# Patient Record
Sex: Male | Born: 1955 | Race: Black or African American | Hispanic: No | Marital: Single | Smoking: Current every day smoker
Health system: Southern US, Community
[De-identification: ages and names within clinical notes are randomized; demographics above are authoritative.]

## PROBLEM LIST (undated history)

## (undated) DIAGNOSIS — R319 Hematuria, unspecified: Secondary | ICD-10-CM

## (undated) DIAGNOSIS — N390 Urinary tract infection, site not specified: Secondary | ICD-10-CM

## (undated) DIAGNOSIS — A549 Gonococcal infection, unspecified: Secondary | ICD-10-CM

## (undated) DIAGNOSIS — K219 Gastro-esophageal reflux disease without esophagitis: Secondary | ICD-10-CM

## (undated) DIAGNOSIS — K279 Peptic ulcer, site unspecified, unspecified as acute or chronic, without hemorrhage or perforation: Secondary | ICD-10-CM

---

## 2001-09-12 DIAGNOSIS — K279 Peptic ulcer, site unspecified, unspecified as acute or chronic, without hemorrhage or perforation: Secondary | ICD-10-CM

## 2001-09-12 HISTORY — PX: REPAIR OF PERFORATED ULCER: SHX6065

## 2001-09-12 HISTORY — DX: Peptic ulcer, site unspecified, unspecified as acute or chronic, without hemorrhage or perforation: K27.9

## 2007-09-13 DIAGNOSIS — N390 Urinary tract infection, site not specified: Secondary | ICD-10-CM

## 2007-09-13 DIAGNOSIS — A549 Gonococcal infection, unspecified: Secondary | ICD-10-CM

## 2007-09-13 DIAGNOSIS — R319 Hematuria, unspecified: Secondary | ICD-10-CM

## 2007-09-13 HISTORY — DX: Urinary tract infection, site not specified: N39.0

## 2007-09-13 HISTORY — DX: Gonococcal infection, unspecified: A54.9

## 2007-09-13 HISTORY — DX: Hematuria, unspecified: R31.9

## 2018-10-10 ENCOUNTER — Other Ambulatory Visit: Payer: Self-pay

## 2018-10-10 ENCOUNTER — Emergency Department (HOSPITAL_BASED_OUTPATIENT_CLINIC_OR_DEPARTMENT_OTHER)
Admission: EM | Admit: 2018-10-10 | Discharge: 2018-10-10 | Disposition: A | Discharge status: Home / Self Care | Payer: Self-pay | Attending: Emergency Medicine | Admitting: Emergency Medicine

## 2018-10-10 ENCOUNTER — Encounter (HOSPITAL_BASED_OUTPATIENT_CLINIC_OR_DEPARTMENT_OTHER): Payer: Self-pay

## 2018-10-10 DIAGNOSIS — F17210 Nicotine dependence, cigarettes, uncomplicated: Secondary | ICD-10-CM | POA: Insufficient documentation

## 2018-10-10 DIAGNOSIS — K279 Peptic ulcer, site unspecified, unspecified as acute or chronic, without hemorrhage or perforation: Secondary | ICD-10-CM | POA: Insufficient documentation

## 2018-10-10 HISTORY — DX: Gastro-esophageal reflux disease without esophagitis: K21.9

## 2018-10-10 LAB — CBC
HCT: 39.3 % (ref 39.0–52.0)
Hemoglobin: 13.4 g/dL (ref 13.0–17.0)
MCH: 34.5 pg — ABNORMAL HIGH (ref 26.0–34.0)
MCHC: 34.1 g/dL (ref 30.0–36.0)
MCV: 101.3 fL — ABNORMAL HIGH (ref 80.0–100.0)
Platelets: 251 10*3/uL (ref 150–400)
RBC: 3.88 MIL/uL — ABNORMAL LOW (ref 4.22–5.81)
RDW: 12.7 % (ref 11.5–15.5)
WBC: 11.6 10*3/uL — ABNORMAL HIGH (ref 4.0–10.5)
nRBC: 0 % (ref 0.0–0.2)

## 2018-10-10 LAB — COMPREHENSIVE METABOLIC PANEL
ALT: 20 U/L (ref 0–44)
AST: 33 U/L (ref 15–41)
Albumin: 4.2 g/dL (ref 3.5–5.0)
Alkaline Phosphatase: 86 U/L (ref 38–126)
Anion gap: 10 (ref 5–15)
BUN: 17 mg/dL (ref 8–23)
CO2: 26 mmol/L (ref 22–32)
Calcium: 9.1 mg/dL (ref 8.9–10.3)
Chloride: 102 mmol/L (ref 98–111)
Creatinine, Ser: 1.09 mg/dL (ref 0.61–1.24)
GFR calc Af Amer: >60 mL/min (ref >60)
GFR calc non Af Amer: >60 mL/min (ref >60)
Glucose, Bld: 166 mg/dL — ABNORMAL HIGH (ref 70–99)
Potassium: 3.7 mmol/L (ref 3.5–5.1)
Sodium: 138 mmol/L (ref 135–145)
Total Bilirubin: 0.5 mg/dL (ref 0.3–1.2)
Total Protein: 7.6 g/dL (ref 6.5–8.1)

## 2018-10-10 LAB — TROPONIN I: Troponin I: <0.03 ng/mL (ref <0.03)

## 2018-10-10 LAB — LIPASE, BLOOD: Lipase: 33 U/L (ref 11–51)

## 2018-10-10 MED ORDER — OMEPRAZOLE 20 MG PO CPDR: 20.0000 mg | DELAYED_RELEASE_CAPSULE | Freq: Every day | ORAL | 1 refills | Status: DC

## 2018-10-10 MED ORDER — ALUM & MAG HYDROXIDE-SIMETH 200-200-20 MG/5ML PO SUSP
30.0000 mL | Freq: Once | ORAL | Status: AC
  Administered 2018-10-10: 30 mL via ORAL
  Filled 2018-10-10: qty 30

## 2018-10-10 MED ORDER — GI COCKTAIL ~~LOC~~: 30.0000 mL | Freq: Two times a day (BID) | ORAL | 1 refills | Status: DC

## 2018-10-10 MED ORDER — LIDOCAINE VISCOUS HCL 2 % MT SOLN
15.0000 mL | Freq: Once | OROMUCOSAL | Status: AC
  Administered 2018-10-10: 15 mL via ORAL
  Filled 2018-10-10: qty 15

## 2018-10-10 NOTE — ED Triage Notes (Signed)
C/o abd pain x 2 week-states feels same as when dx with GERD- and "had a procedure"-states he did take prevacid in the past-none recently-to triage in w/c

## 2018-10-10 NOTE — ED Notes (Signed)
Patient attempted to give a urine sample. Patient unable to at this time.

## 2018-10-10 NOTE — ED Notes (Signed)
Pt c/o upper abdominal pain with 2 episodes of vomiting today. Pt states that he has not been able to take his prevacid as it has been taken off the shelves.

## 2018-10-10 NOTE — Discharge Instructions (Signed)
Avoid any type of ibuprofen, Aleve or aspirin products.  Avoid alcohol and foods with pineapple, oranges and tomatoes.  Also avoid spicy foods.  If the pain gets worse or you start vomiting and you see blood in it you need to return immediately

## 2018-10-10 NOTE — ED Notes (Signed)
Pt verbalizes understanding of d/c instructions and denies any further need at this time. 

## 2018-10-10 NOTE — ED Provider Notes (Signed)
[facility] EMERGENCY DEPARTMENT Provider Note   CSN: [id number] Arrival date & time: 10/10/18  1651     History   Chief Complaint Chief Complaint  Patient presents with  . Abdominal Pain    HPI Vincent Park is a 63 y.o. male.  The history is provided by the patient and a relative.  Abdominal Pain  Pain location:  Epigastric Pain quality: burning, cramping and gnawing   Pain radiates to:  Does not radiate Pain severity:  Severe Onset quality:  Gradual Duration:  4 weeks Timing:  Intermittent Progression:  Worsening Chronicity:  Recurrent Context: alcohol use, eating and previous surgery   Context: not recent travel, not retching, not sick contacts, not suspicious food intake and not trauma   Context comment:  States he does drink a beer every day but does not drink alcohol heavily.  Prior history of a ruptured gastric ulcer status post surgery.  Last time he took Prevacid was approximately 2 years ago. Relieved by:  None tried Worsened by:  Eating Ineffective treatments:  Belching Associated symptoms: anorexia, flatus and nausea   Associated symptoms: no chest pain, no constipation, no cough, no diarrhea, no fever, no hematemesis, no shortness of breath and no vomiting   Risk factors: no aspirin use, no NSAID use and no recent hospitalization   Risk factors comment:  Daily smoker and drinks of beer daily   Past Medical History:  Diagnosis Date  . GERD (gastroesophageal reflux disease)     There are no active problems to display for this patient.   History reviewed. No pertinent surgical history.      Home Medications    Prior to Admission medications   Not on File    Family History No family history on file.  Social History Social History   Tobacco Use  . Smoking status: Current Every Day Smoker    Types: Cigars, Cigarettes  . Smokeless tobacco: Never Used  Substance Use Topics  . Alcohol use: Yes    Comment: daily  . Drug use: Yes     Types: Marijuana     Allergies   Patient has no known allergies.   Review of Systems Review of Systems  Constitutional: Negative for fever.  Respiratory: Negative for cough and shortness of breath.   Cardiovascular: Negative for chest pain.  Gastrointestinal: Positive for abdominal pain, anorexia, flatus and nausea. Negative for constipation, diarrhea, hematemesis and vomiting.  All other systems reviewed and are negative.    Physical Exam Updated Vital Signs BP 139/79 (BP Location: Left Arm)   Pulse 67   Temp 98.1 F (36.7 C) (Oral)   Resp 18   Ht 6\' 2"  (1.88 m)   Wt 64.1 kg   SpO2 100%   BMI 18.14 kg/m   Physical Exam Vitals signs and nursing note reviewed.  Constitutional:      General: He is not in acute distress.    Appearance: He is well-developed.  HENT:     Head: Normocephalic and atraumatic.     Comments: Edentulous Eyes:     Conjunctiva/sclera: Conjunctivae normal.     Pupils: Pupils are equal, round, and reactive to light.  Neck:     Musculoskeletal: Normal range of motion and neck supple.  Cardiovascular:     Rate and Rhythm: Normal rate and regular rhythm.     Heart sounds: No murmur.  Pulmonary:     Effort: Pulmonary effort is normal. No respiratory distress.     Breath sounds: Normal  breath sounds. No wheezing or rales.  Abdominal:     General: There is no distension.     Palpations: Abdomen is soft.     Tenderness: There is abdominal tenderness in the epigastric area. There is no guarding or rebound.     Hernia: No hernia is present.     Comments: Well-healed upper midline surgical scar  Musculoskeletal: Normal range of motion.        General: No tenderness.  Skin:    General: Skin is warm and dry.     Findings: No erythema or rash.  Neurological:     Mental Status: He is alert and oriented to person, place, and time.  Psychiatric:        Behavior: Behavior normal.      ED Treatments / Results  Labs (all labs ordered are  listed, but only abnormal results are displayed) Labs Reviewed  COMPREHENSIVE METABOLIC PANEL - Abnormal; Notable for the following components:      Result Value   Glucose, Bld 166 (*)    All other components within normal limits  CBC - Abnormal; Notable for the following components:   WBC 11.6 (*)    RBC 3.88 (*)    MCV 101.3 (*)    MCH 34.5 (*)    All other components within normal limits  LIPASE, BLOOD  TROPONIN I  URINALYSIS, ROUTINE W REFLEX MICROSCOPIC    EKG EKG Interpretation  Date/Time:  Wednesday October 10 2018 17:14:50 EST Ventricular Rate:  65 PR Interval:    QRS Duration: 84 QT Interval:  444 QTC Calculation: 462 R Axis:   71 Text Interpretation:  Sinus rhythm Biatrial enlargement ST elevation, consider inferior injury No previous tracing Confirmed by Gwyneth SproutPlunkett, Ladawn Boullion ([id number]) on 10/10/2018 5:21:40 PM   Radiology No results found.  Procedures Procedures (including critical care time)  Medications Ordered in ED Medications  alum & mag hydroxide-simeth (MAALOX/MYLANTA) 200-200-20 MG/5ML suspension 30 mL (has no administration in time range)    And  lidocaine (XYLOCAINE) 2 % viscous mouth solution 15 mL (has no administration in time range)     Initial Impression / Assessment and Plan / ED Course  I have reviewed the triage vital signs and the nursing notes.  Pertinent labs & imaging results that were available during my care of the patient were reviewed by me and considered in my medical decision making (see chart for details).     Patient presenting today with 3 to 4 weeks of worsening epigastric discomfort that is significantly worsened by eating or drinking anything.  He describes as a burning pain that feels like it is on fire in his epigastric region.  Prior history of ruptured gastric ulcer requiring surgery but also intermittently gets reflux.  He has not been on Prevacid for at least 2 years and since symptoms started within the last month he has  not taken anything for it.  However because it hurts for him to eat he is just stopped eating and has lost approximately 12 pounds this month.  He denies any hematemesis or black stool.  On exam patient is well-appearing with normal vital signs.  He has epigastric tenderness but no abdominal findings concerning for appendicitis, diverticulitis, perforated viscus or obstruction.  CBC with mild leukocytosis of 11, CMP and lipase pending.  Patient's EKG with some mild ST elevation but most likely early re-pole.  He denies any chest pain or shortness of breath concerning for ACS.  Patient was given a GI cocktail  6:38 PM Patient's labs are reassuring as well as normal LFTs and lipase.  Troponin within normal limits.  Patient symptoms did improve after a GI cocktail.  Patient given prescription for GI cocktail and omeprazole.  Encouraged him to follow-up with a PCP but also given follow-up for GI. Final Clinical Impressions(s) / ED Diagnoses   Final diagnoses:  PUD (peptic ulcer disease)    ED Discharge Orders         Ordered    Alum & Mag Hydroxide-Simeth (GI COCKTAIL) SUSP suspension  2 times daily     10/10/18 1836    omeprazole (PRILOSEC) 20 MG capsule  Daily     10/10/18 1836           Gwyneth Sprout, MD 10/10/18 [facility]

## 2018-10-25 ENCOUNTER — Inpatient Hospital Stay (HOSPITAL_BASED_OUTPATIENT_CLINIC_OR_DEPARTMENT_OTHER)
Admission: EM | Admit: 2018-10-25 | Discharge: 2018-11-26 | DRG: 393 | Disposition: A | Discharge status: Home / Self Care | Payer: Self-pay | Attending: Internal Medicine | Admitting: Internal Medicine

## 2018-10-25 ENCOUNTER — Encounter (HOSPITAL_BASED_OUTPATIENT_CLINIC_OR_DEPARTMENT_OTHER): Payer: Self-pay | Admitting: *Deleted

## 2018-10-25 ENCOUNTER — Other Ambulatory Visit: Payer: Self-pay

## 2018-10-25 ENCOUNTER — Emergency Department (HOSPITAL_BASED_OUTPATIENT_CLINIC_OR_DEPARTMENT_OTHER): Payer: Self-pay

## 2018-10-25 DIAGNOSIS — K56609 Unspecified intestinal obstruction, unspecified as to partial versus complete obstruction: Secondary | ICD-10-CM | POA: Diagnosis present

## 2018-10-25 DIAGNOSIS — Z9114 Patient's other noncompliance with medication regimen: Secondary | ICD-10-CM

## 2018-10-25 DIAGNOSIS — Q433 Congenital malformations of intestinal fixation: Secondary | ICD-10-CM | POA: Insufficient documentation

## 2018-10-25 DIAGNOSIS — E860 Dehydration: Secondary | ICD-10-CM | POA: Diagnosis present

## 2018-10-25 DIAGNOSIS — R64 Cachexia: Secondary | ICD-10-CM | POA: Diagnosis present

## 2018-10-25 DIAGNOSIS — F17290 Nicotine dependence, other tobacco product, uncomplicated: Secondary | ICD-10-CM | POA: Diagnosis present

## 2018-10-25 DIAGNOSIS — K279 Peptic ulcer, site unspecified, unspecified as acute or chronic, without hemorrhage or perforation: Secondary | ICD-10-CM | POA: Diagnosis present

## 2018-10-25 DIAGNOSIS — E43 Unspecified severe protein-calorie malnutrition: Secondary | ICD-10-CM | POA: Diagnosis present

## 2018-10-25 DIAGNOSIS — L0291 Cutaneous abscess, unspecified: Secondary | ICD-10-CM

## 2018-10-25 DIAGNOSIS — K668 Other specified disorders of peritoneum: Secondary | ICD-10-CM | POA: Diagnosis present

## 2018-10-25 DIAGNOSIS — Z789 Other specified health status: Secondary | ICD-10-CM | POA: Diagnosis present

## 2018-10-25 DIAGNOSIS — Z8711 Personal history of peptic ulcer disease: Secondary | ICD-10-CM

## 2018-10-25 DIAGNOSIS — K219 Gastro-esophageal reflux disease without esophagitis: Secondary | ICD-10-CM | POA: Diagnosis present

## 2018-10-25 DIAGNOSIS — Z681 Body mass index (BMI) 19 or less, adult: Secondary | ICD-10-CM

## 2018-10-25 DIAGNOSIS — F1010 Alcohol abuse, uncomplicated: Secondary | ICD-10-CM | POA: Diagnosis present

## 2018-10-25 DIAGNOSIS — R188 Other ascites: Secondary | ICD-10-CM | POA: Diagnosis present

## 2018-10-25 DIAGNOSIS — K255 Chronic or unspecified gastric ulcer with perforation: Secondary | ICD-10-CM | POA: Diagnosis present

## 2018-10-25 DIAGNOSIS — R0609 Other forms of dyspnea: Secondary | ICD-10-CM

## 2018-10-25 DIAGNOSIS — K449 Diaphragmatic hernia without obstruction or gangrene: Secondary | ICD-10-CM | POA: Diagnosis present

## 2018-10-25 DIAGNOSIS — D638 Anemia in other chronic diseases classified elsewhere: Secondary | ICD-10-CM | POA: Diagnosis present

## 2018-10-25 DIAGNOSIS — K651 Peritoneal abscess: Secondary | ICD-10-CM | POA: Diagnosis present

## 2018-10-25 DIAGNOSIS — Z79899 Other long term (current) drug therapy: Secondary | ICD-10-CM

## 2018-10-25 DIAGNOSIS — Z4659 Encounter for fitting and adjustment of other gastrointestinal appliance and device: Secondary | ICD-10-CM

## 2018-10-25 DIAGNOSIS — K631 Perforation of intestine (nontraumatic): Principal | ICD-10-CM | POA: Diagnosis present

## 2018-10-25 DIAGNOSIS — N179 Acute kidney failure, unspecified: Secondary | ICD-10-CM | POA: Diagnosis present

## 2018-10-25 DIAGNOSIS — K2950 Unspecified chronic gastritis without bleeding: Secondary | ICD-10-CM | POA: Diagnosis present

## 2018-10-25 DIAGNOSIS — F17210 Nicotine dependence, cigarettes, uncomplicated: Secondary | ICD-10-CM | POA: Diagnosis present

## 2018-10-25 HISTORY — DX: Peptic ulcer, site unspecified, unspecified as acute or chronic, without hemorrhage or perforation: K27.9

## 2018-10-25 HISTORY — DX: Urinary tract infection, site not specified: N39.0

## 2018-10-25 HISTORY — DX: Gonococcal infection, unspecified: A54.9

## 2018-10-25 HISTORY — DX: Hematuria, unspecified: R31.9

## 2018-10-25 LAB — CBC WITH DIFFERENTIAL/PLATELET
ABS IMMATURE GRANULOCYTES: 0.1 10*3/uL — AB (ref 0.00–0.07)
Basophils Absolute: 0 10*3/uL (ref 0.0–0.1)
Basophils Relative: 0 %
Eosinophils Absolute: 0 10*3/uL (ref 0.0–0.5)
Eosinophils Relative: 0 %
HCT: 38 % — ABNORMAL LOW (ref 39.0–52.0)
Hemoglobin: 12.5 g/dL — ABNORMAL LOW (ref 13.0–17.0)
IMMATURE GRANULOCYTES: 1 %
LYMPHS PCT: 7 %
Lymphs Abs: 0.7 10*3/uL (ref 0.7–4.0)
MCH: 33.4 pg (ref 26.0–34.0)
MCHC: 32.9 g/dL (ref 30.0–36.0)
MCV: 101.6 fL — ABNORMAL HIGH (ref 80.0–100.0)
MONO ABS: 0.5 10*3/uL (ref 0.1–1.0)
Monocytes Relative: 5 %
Neutro Abs: 9.1 10*3/uL — ABNORMAL HIGH (ref 1.7–7.7)
Neutrophils Relative %: 87 %
Platelets: 276 10*3/uL (ref 150–400)
RBC: 3.74 MIL/uL — ABNORMAL LOW (ref 4.22–5.81)
RDW: 13.9 % (ref 11.5–15.5)
WBC: 10.5 10*3/uL (ref 4.0–10.5)
nRBC: 0.2 % (ref 0.0–0.2)

## 2018-10-25 LAB — LIPASE, BLOOD: Lipase: 30 U/L (ref 11–51)

## 2018-10-25 LAB — URINALYSIS, ROUTINE W REFLEX MICROSCOPIC
Bilirubin Urine: NEGATIVE
GLUCOSE, UA: NEGATIVE mg/dL
Hgb urine dipstick: NEGATIVE
Ketones, ur: NEGATIVE mg/dL
Leukocytes,Ua: NEGATIVE
Nitrite: NEGATIVE
PH: 5.5 (ref 5.0–8.0)
Protein, ur: NEGATIVE mg/dL
Specific Gravity, Urine: 1.02 (ref 1.005–1.030)

## 2018-10-25 LAB — COMPREHENSIVE METABOLIC PANEL
ALT: 34 U/L (ref 0–44)
AST: 45 U/L — ABNORMAL HIGH (ref 15–41)
Albumin: 2.2 g/dL — ABNORMAL LOW (ref 3.5–5.0)
Alkaline Phosphatase: 134 U/L — ABNORMAL HIGH (ref 38–126)
Anion gap: 13 (ref 5–15)
BUN: 59 mg/dL — ABNORMAL HIGH (ref 8–23)
CO2: 23 mmol/L (ref 22–32)
Calcium: 8.5 mg/dL — ABNORMAL LOW (ref 8.9–10.3)
Chloride: 98 mmol/L (ref 98–111)
Creatinine, Ser: 1.3 mg/dL — ABNORMAL HIGH (ref 0.61–1.24)
GFR calc Af Amer: >60 mL/min (ref >60)
GFR calc non Af Amer: 58 mL/min — ABNORMAL LOW (ref >60)
GLUCOSE: 116 mg/dL — AB (ref 70–99)
Potassium: 5.1 mmol/L (ref 3.5–5.1)
Sodium: 134 mmol/L — ABNORMAL LOW (ref 135–145)
Total Bilirubin: 0.8 mg/dL (ref 0.3–1.2)
Total Protein: 7 g/dL (ref 6.5–8.1)

## 2018-10-25 MED ORDER — PANTOPRAZOLE SODIUM 40 MG IV SOLR: 40.0000 mg | Freq: Two times a day (BID) | INTRAVENOUS | Status: DC

## 2018-10-25 MED ORDER — PANTOPRAZOLE SODIUM 40 MG IV SOLR
INTRAVENOUS | Status: AC
  Filled 2018-10-25: qty 80

## 2018-10-25 MED ORDER — SODIUM CHLORIDE 0.9 % IV SOLN
8.0000 mg/h | INTRAVENOUS | Status: AC
  Administered 2018-10-25 – 2018-10-28 (×6): 8 mg/h via INTRAVENOUS
  Filled 2018-10-25 (×12): qty 80

## 2018-10-25 MED ORDER — SODIUM CHLORIDE 0.9 % IV SOLN
80.0000 mg | Freq: Once | INTRAVENOUS | Status: DC
  Filled 2018-10-25: qty 80

## 2018-10-25 MED ORDER — FAMOTIDINE IN NACL 20-0.9 MG/50ML-% IV SOLN
20.0000 mg | Freq: Once | INTRAVENOUS | Status: AC
  Administered 2018-10-25: 20 mg via INTRAVENOUS
  Filled 2018-10-25: qty 50

## 2018-10-25 MED ORDER — IOPAMIDOL (ISOVUE-300) INJECTION 61%
100.0000 mL | Freq: Once | INTRAVENOUS | Status: AC | PRN
  Administered 2018-10-25: 100 mL via INTRAVENOUS

## 2018-10-25 MED ORDER — SODIUM CHLORIDE 0.9 % IV BOLUS
1000.0000 mL | Freq: Once | INTRAVENOUS | Status: AC
  Administered 2018-10-25: 1000 mL via INTRAVENOUS

## 2018-10-25 MED ORDER — PIPERACILLIN-TAZOBACTAM 3.375 G IVPB 30 MIN
3.3750 g | Freq: Once | INTRAVENOUS | Status: AC
  Administered 2018-10-25: 3.375 g via INTRAVENOUS
  Filled 2018-10-25 (×2): qty 50

## 2018-10-25 NOTE — ED Notes (Signed)
Pt transported to [facility] via [facility] 

## 2018-10-25 NOTE — ED Notes (Signed)
Vincent Park, Pt's son: [phone]

## 2018-10-25 NOTE — ED Notes (Signed)
[facility] notified (Tammy) - patient ready for transport 

## 2018-10-25 NOTE — ED Triage Notes (Signed)
Pt c/o abd  ' burning " x 2 days

## 2018-10-25 NOTE — Consult Note (Signed)
Vincent Park  12-Jan-1956 [id number]  CARE TEAM:  PCP: Patient, No Pcp Per  Outpatient Care Team: Patient Care Team: Patient, No Pcp Per as PCP - General (General Practice)  Inpatient Treatment Team: Treatment Team: Attending Provider: Maia Plan, MD; Physician Assistant: Beryle Quant; Registered Nurse: Elige Ko, RN; Registered Nurse: Lou Miner, RN; Consulting Physician: Montez Morita, Md, MD   This patient is a 63 y.o.male who presents today for surgical evaluation at the request of Jory Sims, PA.  [facility] ED   Chief complaint / Reason for evaluation: Free air and bowel obstruction in patient with prior ulcer surgery.  63 year old gentleman.  Originally from [place].  Relocated to [place] a while ago.  While he was there, he recalls having some type of surgery in 2003.  Sounds like ulcer surgery.   He cannot remember where exactly.  Apparently he has been in [place] since 2007.  I do not think he sees  doctors.  Nothing in Care Everywhere except for +UTI & gonorrhea with a 2009 CAT scan noting no kidney stones & otherwise normal in [facility] in [place] .  He notes he usually has severe heartburn and normally has been on a proton pump inhibitor.  He been on Prevacid for years.  Pepcid in 2009.  However he cannot find it over-the-counter so was trying to do Tums only.  After few years he had a more severe burning postprandial pain.   Came the emergency department 2 weeks ago.  Labs and exam not severely concerning and seemed to have some symptomatic relief with a GI cocktail.  Prescription for PPI made and strong recommendation to follow-up (?establish?) with a primary care physician and gastroenterology.  Apparently that did not happen.  He comes in 2 weeks later tonight with even more weight loss and worsening pain.  Again the [facility] emergency room.  Mildly elevated creatinine but white count not specifically increased.  CT scan done for persistent  worsening symptoms.  Shows dilated & thickened small bowel with transition point suspicious for bowel obstruction.  Possible intraluminal foreign body such as a pill down the pelvis.  A lot of ascites.  Thickened bowel loops.  Free air.  Patient apparently does not have severe peritonitis but obviously concerning.  Surgical consultation requested.  Transfer recommended to the hospital with an operating room.  Recommendation made for internal medicine evaluation for preoperative clearance and care.  Apparently patient admitted directly to floor bed to medicine   Assessment  Vincent Park  63 y.o. male       Problem List:  Principal Problem:   Pneumoperitoneum Active Problems:   History of medication noncompliance   PUD (peptic ulcer disease)   AKI (acute kidney injury) (HCC)   Cachexia (HCC)   Malrotation of intestine   SBO (small bowel obstruction) (HCC)   Ascites   Moderate alcohol consumption   GERD (gastroesophageal reflux disease)   Free air ascites and thickened bowel loops of uncertain etiology.  Perhaps bowel perforation from chronic severe bowel obstruction versus recurrent ulcer disease.  Mild distended and mildly uncomfortable, he is not toxic nor sickly.  He does not have peritonitis.  Confusing picture  Plan:  Admit.  Medical admission & preop clearance  IV fluids.  N.p.o.  Nasogastric tube if has recurrent or persistent nausea, certainly if he has emesis again  Most likely will need operative exploration this hospital admission..  Given the horrific appearance of the  CAT scan, I was concerned he would need immediate laparotomy.  However he does not have peritonitis nor is he toxic right now.  I do not think this needs to happen in the middle the night.  He is severely malnourished.  I think we have a little time to try and sort him out.  However, I do not think that his severe bowel obstruction will resolve without surgery.  I am skeptical that just percutaneous  drainage to start will be enough although it might help temporize things.  I doubt he is a minimally invasive/laparoscopic candidate as the likelihood of severe peritonitis, bowel resection/ostomy and open abdomen very likely.  Clinically he is not toxic or sickly.  He is open to the idea of surgery but is not interested in proceeding right now.  He is not in shock at this moment.  We will delay surgery a few more hours until the morning and allow time for medicine differently evaluate and provide a better team to plan surgical intervention.  I suspect he has been living this chronically obstructed for the past several weeks  The anatomy & physiology of the digestive tract was discussed.  The pathophysiology of perforation was discussed.  Differential diagnosis such as perforated ulcer or colon, etc was discussed.   Natural history risks without surgery such as death was discussed.  I recommended abdominal exploration to diagnose & treat the source of the problem.  Laparoscopic & open techniques were discussed.   Risks such as bleeding, infection, abscess, leak, reoperation, bowel resection, possible ostomy, injury to other organs, need for repair of tissues / organs, hernia, heart attack, death, and other risks were discussed.   The risks of no intervention will lead to serious problems including death.   I expressed a good likelihood that surgery will address the problem.    Goals of post-operative recovery were discussed as well.  We will work to minimize complications although risks in an emergent setting are high.   Questions were answered.  The patient expressed understanding & wishes to proceed with surgery.      -Proton pump inhibitor.  I am skeptical that this is due to recurrent perforated ulcer but that could be an issue -Chest x-ray operative clearance -EKG to rule out dysrhythmia other surprises. -Check prealbumin.  Suspect severe malnutrition with his cachexia and unintentional weight  loss -Lactic acid baseline -VTE prophylaxis- SCDs, etc -mobilize as tolerated to help recovery  90 minutes spent in review, evaluation, examination, counseling, and coordination of care.  More than 50% of that time was spent in counseling.  Ardeth Sportsman, MD, FACS, MASCRS Gastrointestinal and Minimally Invasive Surgery    [address], [address] [place], [place] [address] [phone] Main / Paging [phone] Fax   10/25/2018      Past Medical History:  Diagnosis Date  . GERD (gastroesophageal reflux disease)   . Gonorrhea 2009  . Hematuria 2009  . Peptic ulcer 2003   ?ulcer surgery in [place]?  Marland Kitchen UTI (urinary tract infection) 2009    Past Surgical History:  Procedure Laterality Date  . REPAIR OF PERFORATED ULCER  [place], [place] ?    Social History   Socioeconomic History  . Marital status: Single    Spouse name: Not on file  . Number of children: Not on file  . Years of education: Not on file  . Highest education level: Not on file  Occupational History  . Not on file  Social Needs  . Financial resource strain: Hard  . Food insecurity:    Worry: Not on file    Inability: Not on file  . Transportation needs:    Medical: Yes    Non-medical: Not on file  Tobacco Use  . Smoking status: Current Every Day Smoker    Packs/day: 0.50    Types: Cigars, Cigarettes  . Smokeless tobacco: Never Used  Substance and Sexual Activity  . Alcohol use: Yes    Comment: 3-6 cans/week  . Drug use: Yes    Types: Marijuana  . Sexual activity: Not on file  Lifestyle  . Physical activity:    Days per week: Not on file    Minutes per session: Not on file  . Stress: Not on file  Relationships  . Social connections:    Talks on phone: Not on file    Gets together: Not on file    Attends religious service: Not on file    Active member of club or organization: Not on file    Attends meetings of clubs or organizations: Not on file    Relationship status:  Not on file  . Intimate partner violence:    Fear of current or ex partner: Not on file    Emotionally abused: Not on file    Physically abused: Not on file    Forced sexual activity: Not on file  Other Topics Concern  . Not on file  Social History Narrative   Originally from [place]   Lived in [place] ~ 2003   Lived in [place] since 2007    History reviewed. No pertinent family history.  Current Facility-Administered Medications  Medication Dose Route Frequency Provider Last Rate Last Dose  . piperacillin-tazobactam (ZOSYN) IVPB 3.375 g  3.375 g Intravenous Once Bethel Born, PA-C 100 mL/hr at 10/25/18 1951 3.375 g at 10/25/18 1951   Current Outpatient Medications  Medication Sig Dispense Refill  . Alum & Mag Hydroxide-Simeth (GI COCKTAIL) SUSP suspension Take 30 mLs by mouth 2 (two) times daily. Shake well. 120 mL 1  . omeprazole (PRILOSEC) 20 MG capsule Take 1 capsule (20 mg total) by mouth daily. 30 capsule 1     No Known Allergies  ROS:   All other systems reviewed & are negative except per HPI or as noted below: Constitutional:  No fevers, chills, sweats.  Unintentional weight loss.  He thinks it has been 20 pounds in a month.  He is gone from 64 kg to around 50 kg this admission in 2 weeks Eyes:  No vision changes, No discharge HENT:  No sore throats, nasal drainage Lymph: No neck swelling, No bruising easily Pulmonary:  No cough, productive sputum CV: No orthopnea, PND  Patient walks 10 minutes without difficulty.  No exertional chest/neck/shoulder/arm pain.  Shortness of breath with exertion though. GI:  No personal nor family history of GI/colon cancer, inflammatory bowel disease, irritable bowel syndrome, allergy such as Celiac Sprue, dietary/dairy problems, colitis.  No recent sick contacts/gastroenteritis.  No travel outside the country.  No changes in diet. Renal: No UTIs, No hematuria since 2013 Genital:  No drainage, bleeding,  masses Musculoskeletal: No severe joint pain.  Good ROM major joints Skin:  No sores or lesions.  No rashes Heme/Lymph:  No easy bleeding.  No swollen lymph nodes Neuro: No focal weakness/numbness.  No seizures Psych: No suicidal ideation.  No hallucinations  BP 128/67   Pulse 89   Temp 99 F (37.2 C) (  Rectal)   Resp 18   Ht 6\' 2"  (1.88 m)   Wt 48 kg   SpO2 100%   BMI 13.59 kg/m   Physical Exam: General: Pt awake/alert/oriented x4 in no major acute distress.  Sitting calmly.  Not toxic.  Very cachectic.  Not sickly.  Alert. Eyes: PERRL, normal EOM. Sclera nonicteric Neuro: CN II-XII intact w/o focal sensory/motor deficits. Lymph: No head/neck/groin lymphadenopathy Psych:  No delerium/psychosis/paranoia.  Memory recall rather poor.  Insight fair.   HENT: Normocephalic, Mucus membranes moist.  No thrush.  Tends to mumble.  Hard to understand at times. Neck: Supple, No tracheal deviation Chest: No pain.  Good respiratory excursion. CV:  Pulses intact.  Regular rhythm  Abdomen: Somewhat firm.  Moderately distended.  Nontender.  No pain with bed shake, percussion, cough.  No evidence of peritonitis.  No guarding.  Upper midline incision with epigastric scar consistent with nearly exposed tail of old stitch.  No hernia.    Gen: Uncircumcised.  Normal external genitalia no inguinal hernias.  No inguinal lymphadenopathy.   Ext:  SCDs BLE.  No significant edema.  No cyanosis Skin: No petechiae / purpurea.  No major sores Musculoskeletal: No severe joint pain.  Good ROM major joints   Results:   Labs: Results for orders placed or performed during the hospital encounter of 10/25/18 (from the past 48 hour(s))  CBC with Differential     Status: Abnormal   Collection Time: 10/25/18  5:21 PM  Result Value Ref Range   WBC 10.5 4.0 - 10.5 K/uL    Comment: REPEATED TO VERIFY   RBC 3.74 (L) 4.22 - 5.81 MIL/uL   Hemoglobin 12.5 (L) 13.0 - 17.0 g/dL   HCT 16.138.0 (L) 09.639.0 - 04.552.0 %   MCV  101.6 (H) 80.0 - 100.0 fL   MCH 33.4 26.0 - 34.0 pg   MCHC 32.9 30.0 - 36.0 g/dL   RDW 40.913.9 81.111.5 - 91.415.5 %   Platelets 276 150 - 400 K/uL   nRBC 0.2 0.0 - 0.2 %   Neutrophils Relative % 87 %   Neutro Abs 9.1 (H) 1.7 - 7.7 K/uL   Lymphocytes Relative 7 %   Lymphs Abs 0.7 0.7 - 4.0 K/uL   Monocytes Relative 5 %   Monocytes Absolute 0.5 0.1 - 1.0 K/uL   Eosinophils Relative 0 %   Eosinophils Absolute 0.0 0.0 - 0.5 K/uL   Basophils Relative 0 %   Basophils Absolute 0.0 0.0 - 0.1 K/uL   WBC Morphology MILD LEFT SHIFT (1-5% METAS, OCC MYELO, OCC BANDS)    Immature Granulocytes 1 %   Abs Immature Granulocytes 0.10 (H) 0.00 - 0.07 K/uL   Stomatocytes PRESENT     Comment: Performed at [facility] [place], [address] [place], [place] [id number]  Comprehensive metabolic panel     Status: Abnormal   Collection Time: 10/25/18  5:21 PM  Result Value Ref Range   Sodium 134 (L) 135 - 145 mmol/L   Potassium 5.1 3.5 - 5.1 mmol/L   Chloride 98 98 - 111 mmol/L   CO2 23 22 - 32 mmol/L   Glucose, Bld 116 (H) 70 - 99 mg/dL   BUN 59 (H) 8 - 23 mg/dL   Creatinine, Ser 6.211.30 (H) 0.61 - 1.24 mg/dL   Calcium 8.5 (L) 8.9 - 10.3 mg/dL   Total Protein 7.0 6.5 - 8.1 g/dL   Albumin 2.2 (L) 3.5 - 5.0 g/dL   AST 45 (H) 15 -  41 U/L   ALT 34 0 - 44 U/L   Alkaline Phosphatase 134 (H) 38 - 126 U/L   Total Bilirubin 0.8 0.3 - 1.2 mg/dL   GFR calc non Af Amer 58 (L) >60 mL/min   GFR calc Af Amer >60 >60 mL/min   Anion gap 13 5 - 15    Comment: Performed at [facility], [address] [place], [place] [address]  Lipase, blood     Status: None   Collection Time: 10/25/18  5:21 PM  Result Value Ref Range   Lipase 30 11 - 51 U/L    Comment: Performed at [facility], [address] [place], [place] [address]  Urinalysis, Routine w reflex microscopic     Status: Abnormal   Collection Time: 10/25/18  6:16 PM  Result Value Ref Range   Color, Urine AMBER (A) YELLOW    Comment:  BIOCHEMICALS MAY BE AFFECTED BY COLOR   APPearance CLEAR CLEAR   Specific Gravity, Urine 1.020 1.005 - 1.030   pH 5.5 5.0 - 8.0   Glucose, UA NEGATIVE NEGATIVE mg/dL   Hgb urine dipstick NEGATIVE NEGATIVE   Bilirubin Urine NEGATIVE NEGATIVE   Ketones, ur NEGATIVE NEGATIVE mg/dL   Protein, ur NEGATIVE NEGATIVE mg/dL   Nitrite NEGATIVE NEGATIVE   Leukocytes,Ua NEGATIVE NEGATIVE    Comment: Microscopic not done on urines with negative protein, blood, leukocytes, nitrite, or glucose < 500 mg/dL. Performed at [facility], [address] [place], [place] [address]     Imaging / Studies: Ct Abdomen Pelvis W Contrast  Result Date: 10/25/2018 CLINICAL DATA:  63 y/o male with abd 'burning' x3 days. EXAM: CT ABDOMEN AND PELVIS WITH CONTRAST TECHNIQUE: Multidetector CT imaging of the abdomen and pelvis was performed using the standard protocol following bolus administration of intravenous contrast. CONTRAST:  ISOVUE-300 IOPAMIDOL (ISOVUE-300) INJECTION 61% COMPARISON:  None. FINDINGS: Lower chest: No acute abnormality. Hepatobiliary: There hepatic fluid. No focal liver lesions. Gallbladder is present. Pancreas: Unremarkable. No pancreatic ductal dilatation or surrounding inflammatory changes. Spleen: Normal in size without focal abnormality. Adrenals/Urinary Tract: Adrenal glands are normal. Small low-attenuation lesion within the midpole of the LEFT kidney is less than 1 centimeter not fully characterize. There is no hydronephrosis. The bladder and visualized portion of the urethra are normal. Stomach/Bowel: The stomach is normal in appearance. 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 There is marked dilatation of small bowel loops. The most distal ileal loops are not dilated. There is significant wall thickening associated with central bowel loops and multiple angular changes in caliber, raising the question of adhesions. Within a mid  to distal small bowel loop in the RIGHT central pelvis, there is radiopaque intraluminal structure measuring 10 millimeters and possibly representing a small ingested bone fragment. The colon is decompressed and otherwise normal in appearance. There is significant ascites and free intraperitoneal air with air-fluid level in the LEFT UPPER QUADRANT. There is enhancement of the peritoneum. Vascular/Lymphatic: No significant vascular findings are present. No enlarged abdominal or pelvic lymph nodes. Reproductive: Prostate is unremarkable. Other: Large volume ascites, free intraperitoneal air, and peritoneal enhancement. Cachexia. Musculoskeletal: No acute or significant osseous findings. IMPRESSION: 1. High-grade small bowel obstruction.  Thickened small bowel loops. 2. Question of ingested foreign body within a distal small bowel loop in the RIGHT hemipelvis. See above. 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 4. Significant ascites, free intraperitoneal air,  and peritoneal enhancement. Critical Value/emergent results were called by telephone at the time of interpretation on 10/25/2018 at 7:13 pm to provider [facility] , who verbally acknowledged these results. Electronically Signed   By: Norva Pavlov M.D.   On: 10/25/2018 19:18    Medications / Allergies: per chart  Antibiotics: Anti-infectives (From admission, onward)   Start     Dose/Rate Route Frequency Ordered Stop   10/25/18 1930  piperacillin-tazobactam (ZOSYN) IVPB 3.375 g     3.375 g 100 mL/hr over 30 Minutes Intravenous  Once 10/25/18 1928          Note: Portions of this report may have been transcribed using voice recognition software. Every effort was made to ensure accuracy; however, inadvertent computerized transcription errors may be present.   Any transcriptional errors that result from this process are unintentional.    Ardeth Sportsman, MD, FACS, MASCRS Gastrointestinal and Minimally Invasive Surgery    [address], [address] [place], [place] [address] [phone] Main / Paging [phone] Fax   10/25/2018

## 2018-10-25 NOTE — ED Provider Notes (Signed)
[facility] EMERGENCY DEPARTMENT Provider Note   CSN: [id number] Arrival date & time: 10/25/18  1443     History   Chief Complaint Chief Complaint  Patient presents with  . Abdominal Pain    HPI Vincent Park is a 63 y.o. male who presents with abdominal pain.  Past medical history significant for history of peptic ulcer disease, GERD.  He states that he has been having problems with abdominal burning for the past year intermittently.  He states that we will burn on his insides when he eats anything.  It also burns when he has to have a bowel movement in his rectum.  He has a history of surgical repair of a perforated ulcer in 2003 in [place].  He is previously been on Prevacid for years however since he ran out of this medicine a year ago he has been having issues with abdominal burning.  It is primarily in the epigastric area but is also generalized at times.  He was here on January 29 for the same problem.  He is losing a lot of weight because it hurts to eat.  He was started on antacids with omeprazole and a GI cocktail however he states that this medicine makes his pain worse.  He is continued to lose weight and his family at bedside are concerned.  He denies fever, nausea, vomiting, diarrhea, melena, hematochezia, urinary symptoms.  He states his mouth feels dry because he has not been able to eat.  They tried to take him to a doctor in the area however he does not have a valid ID and has not been able to follow-up with anyone. No hx of colonoscopy. He works in Production designer, theatre/television/film.   HPI  Past Medical History:  Diagnosis Date  . GERD (gastroesophageal reflux disease)   . Peptic ulcer     There are no active problems to display for this patient.   History reviewed. No pertinent surgical history.      Home Medications    Prior to Admission medications   Medication Sig Start Date End Date Taking? Authorizing Provider  Alum & Mag Hydroxide-Simeth (GI COCKTAIL) SUSP  suspension Take 30 mLs by mouth 2 (two) times daily. Shake well. 10/10/18   Gwyneth Sprout, MD  omeprazole (PRILOSEC) 20 MG capsule Take 1 capsule (20 mg total) by mouth daily. 10/10/18   Gwyneth Sprout, MD    Family History History reviewed. No pertinent family history.  Social History Social History   Tobacco Use  . Smoking status: Current Every Day Smoker    Types: Cigars, Cigarettes  . Smokeless tobacco: Never Used  Substance Use Topics  . Alcohol use: Yes    Comment: daily  . Drug use: Yes    Types: Marijuana     Allergies   Patient has no known allergies.   Review of Systems Review of Systems  Constitutional: Positive for unexpected weight change. Negative for chills and fever.  Respiratory: Negative for shortness of breath.   Cardiovascular: Negative for chest pain.  Gastrointestinal: Positive for abdominal pain and constipation. Negative for blood in stool, diarrhea, nausea and vomiting.  Genitourinary: Negative for difficulty urinating and dysuria.  All other systems reviewed and are negative.    Physical Exam Updated Vital Signs BP 137/74   Pulse 93   Temp 99 F (37.2 C) (Rectal)   Resp 16   Ht 6\' 2"  (1.88 m)   Wt 48 kg   SpO2 97%   BMI 13.59 kg/m  Physical Exam Vitals signs and nursing note reviewed.  Constitutional:      General: He is not in acute distress.    Appearance: He is cachectic.     Comments: Calm and cooperative. Extremely malnourished appearing with extensive muscle wasting  HENT:     Head: Normocephalic and atraumatic.     Comments: Dry mucous membranes Eyes:     General: No scleral icterus.       Right eye: No discharge.        Left eye: No discharge.     Conjunctiva/sclera: Conjunctivae normal.     Pupils: Pupils are equal, round, and reactive to light.  Neck:     Musculoskeletal: Normal range of motion.  Cardiovascular:     Rate and Rhythm: Regular rhythm. Tachycardia present.  Pulmonary:     Effort: Pulmonary  effort is normal. No respiratory distress.     Breath sounds: Normal breath sounds.  Abdominal:     General: There is no distension.     Palpations: Abdomen is soft.     Tenderness: There is abdominal tenderness (diffuse mild tenderness).     Comments: Well healed midline abdominal scar  Skin:    General: Skin is warm and dry.  Neurological:     Mental Status: He is alert and oriented to person, place, and time.  Psychiatric:        Behavior: Behavior normal. Behavior is cooperative.      ED Treatments / Results  Labs (all labs ordered are listed, but only abnormal results are displayed) Labs Reviewed  CBC WITH DIFFERENTIAL/PLATELET - Abnormal; Notable for the following components:      Result Value   RBC 3.74 (*)    Hemoglobin 12.5 (*)    HCT 38.0 (*)    MCV 101.6 (*)    Neutro Abs 9.1 (*)    Abs Immature Granulocytes 0.10 (*)    All other components within normal limits  COMPREHENSIVE METABOLIC PANEL - Abnormal; Notable for the following components:   Sodium 134 (*)    Glucose, Bld 116 (*)    BUN 59 (*)    Creatinine, Ser 1.30 (*)    Calcium 8.5 (*)    Albumin 2.2 (*)    AST 45 (*)    Alkaline Phosphatase 134 (*)    GFR calc non Af Amer 58 (*)    All other components within normal limits  URINALYSIS, ROUTINE W REFLEX MICROSCOPIC - Abnormal; Notable for the following components:   Color, Urine AMBER (*)    All other components within normal limits  LIPASE, BLOOD  PATHOLOGIST SMEAR REVIEW    EKG None  Radiology Ct Abdomen Pelvis W Contrast  Result Date: 10/25/2018 CLINICAL DATA:  63 y/o male with abd 'burning' x3 days. EXAM: CT ABDOMEN AND PELVIS WITH CONTRAST TECHNIQUE: Multidetector CT imaging of the abdomen and pelvis was performed using the standard protocol following bolus administration of intravenous contrast. CONTRAST:  100mL ISOVUE-300 IOPAMIDOL (ISOVUE-300) INJECTION 61% COMPARISON:  None. FINDINGS: Lower chest: No acute abnormality. Hepatobiliary:  There hepatic fluid. No focal liver lesions. Gallbladder is present. Pancreas: Unremarkable. No pancreatic ductal dilatation or surrounding inflammatory changes. Spleen: Normal in size without focal abnormality. Adrenals/Urinary Tract: Adrenal glands are normal. Small low-attenuation lesion within the midpole of the LEFT kidney is less than 1 centimeter not fully characterize. There is no hydronephrosis. The bladder and visualized portion of the urethra are normal. Stomach/Bowel: The stomach is normal in appearance. 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 There is marked dilatation of small bowel loops. The most distal ileal loops are not dilated. There is significant wall thickening associated with central bowel loops and multiple angular changes in caliber, raising the question of adhesions. Within a mid to distal small bowel loop in the RIGHT central pelvis, there is radiopaque intraluminal structure measuring 10 millimeters and possibly representing a small ingested bone fragment. The colon is decompressed and otherwise normal in appearance. There is significant ascites and free intraperitoneal air with air-fluid level in the LEFT UPPER QUADRANT. There is enhancement of the peritoneum. Vascular/Lymphatic: No significant vascular findings are present. No enlarged abdominal or pelvic lymph nodes. Reproductive: Prostate is unremarkable. Other: Large volume ascites, free intraperitoneal air, and peritoneal enhancement. Cachexia. Musculoskeletal: No acute or significant osseous findings. IMPRESSION: 1. High-grade small bowel obstruction.  Thickened small bowel loops. 2. Question of ingested foreign body within a distal small bowel loop in the RIGHT hemipelvis. See above. 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 4. Significant ascites, free intraperitoneal air, and peritoneal enhancement. Critical Value/emergent results were called by telephone at the time of  interpretation on 10/25/2018 at 7:13 pm to provider [facility] , who verbally acknowledged these results. Electronically Signed   By: Norva PavlovElizabeth  Brown M.D.   On: 10/25/2018 19:18    Procedures Procedures (including critical care time)  CRITICAL CARE Performed by: Bethel BornKelly Marie Adylee Leonardo   Total critical care time: 40 minutes  Critical care time was exclusive of separately billable procedures and treating other patients.  Critical care was necessary to treat or prevent imminent or life-threatening deterioration.  Critical care was time spent personally by me on the following activities: development of treatment plan with patient and/or surrogate as well as nursing, discussions with consultants, evaluation of patient's response to treatment, examination of patient, obtaining history from patient or surrogate, ordering and performing treatments and interventions, ordering and review of laboratory studies, ordering and review of radiographic studies, pulse oximetry and re-evaluation of patient's condition.   Medications Ordered in ED Medications  pantoprazole (PROTONIX) 80 mg in sodium chloride 0.9 % 100 mL IVPB (has no administration in time range)  pantoprazole (PROTONIX) 80 mg in sodium chloride 0.9 % 250 mL (0.32 mg/mL) infusion (has no administration in time range)  pantoprazole (PROTONIX) injection 40 mg (has no administration in time range)  famotidine (PEPCID) IVPB 20 mg premix (0 mg Intravenous Stopped 10/25/18 1748)  sodium chloride 0.9 % bolus 1,000 mL (0 mLs Intravenous Stopped 10/25/18 1816)  iopamidol (ISOVUE-300) 61 % injection 100 mL (100 mLs Intravenous Contrast Given 10/25/18 1822)  piperacillin-tazobactam (ZOSYN) IVPB 3.375 g (3.375 g Intravenous New Bag/Given 10/25/18 1951)     Initial Impression / Assessment and Plan / ED Course  I have reviewed the triage vital signs and the nursing notes.  Pertinent labs & imaging results that were available during my care of the patient  were reviewed by me and considered in my medical decision making (see chart for details).  63 year old male presents with worsening of his chronic abdominal pain along with anorexia and unintentional weight loss. His vitals are normal. Abdomen is soft and he has mild generalized tenderness. CBC is remarkable for mild anemia with high MCV. CMP is remarkable for mild hyponatremia (134), elevated BUN/SCr (59/1.3), elevated AP (134), low albumin (2.2), elevated AST (45).  UA is normal. Given worsening symptoms and cachectic appearance, will obtain CT abdomen and pelvis. Will also give fluid  bolus and pepcid.  CT is remarkable for high grade small bowel obstruction with thickened small bowel loops, questionable ingested FB, partial malrotation of the small bowel, and significant ascites, free air, and peritoneal enhancement. Shared visit with Dr. Jacqulyn Bath. Will discuss with surgery. Zosyn was given. Discussed results with patient. He is unsure of where he actually had surgery in [place]. He states he has received some medical care as an outpatient in [place] but otherwise hasn't been hospitalized in [place].   7:50 PM I spoke with Dr. Michaell Cowing. He will see the patient in consult but feels that he cannot be the primary admitter for this patient. Will consult hospitalist.  8:22 PM I spoke with Dr. Onalee Hua with [facility] who will admit.    Final Clinical Impressions(s) / ED Diagnoses   Final diagnoses:  Small bowel obstruction [facility])  Free intraperitoneal air    ED Discharge Orders    None       Bethel Born, PA-C 10/25/18 2023    Maia Plan, MD 10/26/18 1023

## 2018-10-25 NOTE — ED Notes (Signed)
Patient transported to CT 

## 2018-10-26 ENCOUNTER — Encounter (HOSPITAL_COMMUNITY): Payer: Self-pay | Admitting: Anesthesiology

## 2018-10-26 ENCOUNTER — Inpatient Hospital Stay: Payer: Self-pay

## 2018-10-26 ENCOUNTER — Inpatient Hospital Stay (HOSPITAL_COMMUNITY): Payer: Self-pay

## 2018-10-26 ENCOUNTER — Encounter (HOSPITAL_COMMUNITY)
Admission: EM | Disposition: A | Discharge status: Home / Self Care | Payer: Self-pay | Source: Home / Self Care | Attending: Student

## 2018-10-26 DIAGNOSIS — K668 Other specified disorders of peritoneum: Secondary | ICD-10-CM

## 2018-10-26 DIAGNOSIS — E43 Unspecified severe protein-calorie malnutrition: Secondary | ICD-10-CM | POA: Diagnosis present

## 2018-10-26 LAB — BASIC METABOLIC PANEL
Anion gap: 12 (ref 5–15)
BUN: 45 mg/dL — ABNORMAL HIGH (ref 8–23)
CHLORIDE: 102 mmol/L (ref 98–111)
CO2: 23 mmol/L (ref 22–32)
Calcium: 8.5 mg/dL — ABNORMAL LOW (ref 8.9–10.3)
Creatinine, Ser: 1.26 mg/dL — ABNORMAL HIGH (ref 0.61–1.24)
GFR calc Af Amer: >60 mL/min (ref >60)
GFR calc non Af Amer: >60 mL/min (ref >60)
Glucose, Bld: 91 mg/dL (ref 70–99)
Potassium: 5.1 mmol/L (ref 3.5–5.1)
Sodium: 137 mmol/L (ref 135–145)

## 2018-10-26 LAB — PHOSPHORUS: Phosphorus: 4.2 mg/dL (ref 2.5–4.6)

## 2018-10-26 LAB — MAGNESIUM: MAGNESIUM: 2.2 mg/dL (ref 1.7–2.4)

## 2018-10-26 LAB — HEMOGLOBIN A1C
Hgb A1c MFr Bld: 5.9 % — ABNORMAL HIGH (ref 4.8–5.6)
Mean Plasma Glucose: 122.63 mg/dL

## 2018-10-26 LAB — CBC
HEMATOCRIT: 36.4 % — AB (ref 39.0–52.0)
Hemoglobin: 11.8 g/dL — ABNORMAL LOW (ref 13.0–17.0)
MCH: 33 pg (ref 26.0–34.0)
MCHC: 32.4 g/dL (ref 30.0–36.0)
MCV: 101.7 fL — AB (ref 80.0–100.0)
Platelets: 319 10*3/uL (ref 150–400)
RBC: 3.58 MIL/uL — ABNORMAL LOW (ref 4.22–5.81)
RDW: 13.7 % (ref 11.5–15.5)
WBC: 8.5 10*3/uL (ref 4.0–10.5)
nRBC: 0.2 % (ref 0.0–0.2)

## 2018-10-26 LAB — PROTIME-INR
INR: 1.13
Prothrombin Time: 14.4 seconds (ref 11.4–15.2)

## 2018-10-26 LAB — TSH: TSH: 1.792 u[IU]/mL (ref 0.350–4.500)

## 2018-10-26 LAB — LACTIC ACID, PLASMA: LACTIC ACID, VENOUS: 1.4 mmol/L (ref 0.5–1.9)

## 2018-10-26 LAB — PREALBUMIN: Prealbumin: <5 mg/dL — ABNORMAL LOW (ref 18–38)

## 2018-10-26 LAB — PATHOLOGIST SMEAR REVIEW

## 2018-10-26 SURGERY — COLON RESECTION LAPAROSCOPIC
Anesthesia: General

## 2018-10-26 MED ORDER — SODIUM CHLORIDE 0.9% FLUSH
10.0000 mL | INTRAVENOUS | Status: DC | PRN
  Administered 2018-10-27: 10 mL
  Administered 2018-10-31: 20 mL
  Administered 2018-11-02 – 2018-11-16 (×6): 10 mL
  Filled 2018-10-26 (×8): qty 40

## 2018-10-26 MED ORDER — SODIUM CHLORIDE 0.9% FLUSH
10.0000 mL | Freq: Two times a day (BID) | INTRAVENOUS | Status: DC
  Administered 2018-10-26 – 2018-11-22 (×14): 10 mL

## 2018-10-26 MED ORDER — TRAVASOL 10 % IV SOLN
INTRAVENOUS | Status: AC
  Administered 2018-10-26: 18:00:00 via INTRAVENOUS
  Filled 2018-10-26: qty 360

## 2018-10-26 MED ORDER — SODIUM CHLORIDE 0.9 % IV SOLN
INTRAVENOUS | Status: AC
  Administered 2018-10-26 – 2018-10-28 (×4): via INTRAVENOUS

## 2018-10-26 MED ORDER — INSULIN ASPART 100 UNIT/ML ~~LOC~~ SOLN
0.0000 [IU] | Freq: Four times a day (QID) | SUBCUTANEOUS | Status: DC
  Administered 2018-10-31 – 2018-11-03 (×4): 1 [IU] via SUBCUTANEOUS

## 2018-10-26 MED ORDER — PIPERACILLIN-TAZOBACTAM 3.375 G IVPB
3.3750 g | Freq: Three times a day (TID) | INTRAVENOUS | Status: DC
  Administered 2018-10-26 – 2018-11-23 (×86): 3.375 g via INTRAVENOUS
  Filled 2018-10-26 (×88): qty 50

## 2018-10-26 MED ORDER — SODIUM CHLORIDE 0.9 % IV SOLN
INTRAVENOUS | Status: AC
  Administered 2018-10-26: 04:00:00 via INTRAVENOUS

## 2018-10-26 MED ORDER — ENOXAPARIN SODIUM 40 MG/0.4ML ~~LOC~~ SOLN: 40.0000 mg | SUBCUTANEOUS | Status: DC

## 2018-10-26 NOTE — H&P (Signed)
History and Physical    Vincent Park GMW:[id number] DOB: [birth year] DOA: 10/25/2018  PCP: Patient, No Pcp Per  Patient coming from: Home  Chief Complaint: Abdominal pain  HPI: Vincent Park is a 63 y.o. male with medical history significant of perforated peptic ulcer disease in 2003 status post unknown surgical intervention, GERD comes in with progressive worsening epigastric and upper abdominal pain that is been going on for a year.  Patient has had significant weight loss also over the last year.  He drinks 2-3 beers a day after work per his report.  He denies any fevers.  He has been vomiting a lot without any blood.  He denies any diarrhea.  Patient is a very poor historian and has not seen a physician in many years.  Presented to urgent care tonight CT shows dilated and thickened small bowel with transition point for bowel obstruction with free air.  Patient is sitting up in bed with NG tube and has no complaints at this time.  Patient is being referred for admission for perforated bowel.  Dr. gross has been called and is seeing patient in consultation.  Review of Systems: As per HPI otherwise 10 point review of systems negative.   Past Medical History:  Diagnosis Date  . GERD (gastroesophageal reflux disease)   . Gonorrhea 2009  . Hematuria 2009  . Peptic ulcer 2003   ?ulcer surgery in [place]?  Marland Kitchen UTI (urinary tract infection) 2009    Past Surgical History:  Procedure Laterality Date  . REPAIR OF PERFORATED ULCER  2003   [place], [place] ?     reports that he has been smoking cigars and cigarettes. He has been smoking about 0.50 packs per day. He has never used smokeless tobacco. He reports current alcohol use. He reports current drug use. Drug: Marijuana.  No Known Allergies  History reviewed. No pertinent family history.  No premature coronary artery disease  Prior to Admission medications   Medication Sig Start Date End Date Taking? Authorizing Provider  Alum & Mag  Hydroxide-Simeth (GI COCKTAIL) SUSP suspension Take 30 mLs by mouth 2 (two) times daily. Shake well. 10/10/18  Yes Gwyneth Sprout, MD  omeprazole (PRILOSEC) 20 MG capsule Take 1 capsule (20 mg total) by mouth daily. 10/10/18  Yes Gwyneth Sprout, MD    Physical Exam: Vitals:   10/25/18 2030 10/25/18 2100 10/25/18 2209 10/25/18 2218  BP: 138/62 133/64 (!) 138/58   Pulse: 86 88 87   Resp: 18 18 20    Temp:   (!) 97.5 F (36.4 C)   TempSrc:   Oral   SpO2: 100% 100% 100%   Weight:    54 kg  Height:    6\' 2"  (1.88 m)      Constitutional: NAD, calm, comfortable very cachectic with temporal wasting Vitals:   10/25/18 2030 10/25/18 2100 10/25/18 2209 10/25/18 2218  BP: 138/62 133/64 (!) 138/58   Pulse: 86 88 87   Resp: 18 18 20    Temp:   (!) 97.5 F (36.4 C)   TempSrc:   Oral   SpO2: 100% 100% 100%   Weight:    54 kg  Height:    6\' 2"  (1.88 m)   Eyes: PERRL, lids and conjunctivae normal ENMT: Mucous membranes are moist. Posterior pharynx clear of any exudate or lesions.Normal dentition.  Neck: normal, supple, no masses, no thyromegaly Respiratory: clear to auscultation bilaterally, no wheezing, no crackles. Normal respiratory effort. No accessory muscle use.  Cardiovascular: Regular rate and rhythm,  no murmurs / rubs / gallops. No extremity edema. 2+ pedal pulses. No carotid bruits.  Abdomen: no tenderness, no masses palpated. No hepatosplenomegaly. Bowel sounds positive.  Musculoskeletal: no clubbing / cyanosis. No joint deformity upper and lower extremities. Good ROM, no contractures. Normal muscle tone.  Skin: no rashes, lesions, ulcers. No induration Neurologic: CN 2-12 grossly intact. Sensation intact, DTR normal. Strength 5/5 in all 4.  Psychiatric: Normal judgment and insight. Alert and oriented x 3. Normal mood.    Labs on Admission: I have personally reviewed following labs and imaging studies  CBC: Recent Labs  Lab 10/25/18 1721  WBC 10.5  NEUTROABS 9.1*  HGB  12.5*  HCT 38.0*  MCV 101.6*  PLT 276   Basic Metabolic Panel: Recent Labs  Lab 10/25/18 1721  NA 134*  K 5.1  CL 98  CO2 23  GLUCOSE 116*  BUN 59*  CREATININE 1.30*  CALCIUM 8.5*   GFR: Estimated Creatinine Clearance: 44.4 mL/min (A) (by C-G formula based on SCr of 1.3 mg/dL (H)). Liver Function Tests: Recent Labs  Lab 10/25/18 1721  AST 45*  ALT 34  ALKPHOS 134*  BILITOT 0.8  PROT 7.0  ALBUMIN 2.2*   Recent Labs  Lab 10/25/18 1721  LIPASE 30   No results for input(s): AMMONIA in the last 168 hours. Coagulation Profile: No results for input(s): INR, PROTIME in the last 168 hours. Cardiac Enzymes: No results for input(s): CKTOTAL, CKMB, CKMBINDEX, TROPONINI in the last 168 hours. BNP (last 3 results) No results for input(s): PROBNP in the last 8760 hours. HbA1C: No results for input(s): HGBA1C in the last 72 hours. CBG: No results for input(s): GLUCAP in the last 168 hours. Lipid Profile: No results for input(s): CHOL, HDL, LDLCALC, TRIG, CHOLHDL, LDLDIRECT in the last 72 hours. Thyroid Function Tests: No results for input(s): TSH, T4TOTAL, FREET4, T3FREE, THYROIDAB in the last 72 hours. Anemia Panel: No results for input(s): VITAMINB12, FOLATE, FERRITIN, TIBC, IRON, RETICCTPCT in the last 72 hours. Urine analysis:    Component Value Date/Time   COLORURINE AMBER (A) 10/25/2018 1816   APPEARANCEUR CLEAR 10/25/2018 1816   LABSPEC 1.020 10/25/2018 1816   PHURINE 5.5 10/25/2018 1816   GLUCOSEU NEGATIVE 10/25/2018 1816   HGBUR NEGATIVE 10/25/2018 1816   BILIRUBINUR NEGATIVE 10/25/2018 1816   KETONESUR NEGATIVE 10/25/2018 1816   PROTEINUR NEGATIVE 10/25/2018 1816   NITRITE NEGATIVE 10/25/2018 1816   LEUKOCYTESUR NEGATIVE 10/25/2018 1816   Sepsis Labs: !!!!!!!!!!!!!!!!!!!!!!!!!!!!!!!!!!!!!!!!!!!! @LABRCNTIP (procalcitonin:4,lacticidven:4) )No results found for this or any previous visit (from the past 240 hour(s)).   Radiological Exams on Admission: Ct  Abdomen Pelvis W Contrast  Result Date: 10/25/2018 CLINICAL DATA:  63 y/o male with abd 'burning' x3 days. EXAM: CT ABDOMEN AND PELVIS WITH CONTRAST TECHNIQUE: Multidetector CT imaging of the abdomen and pelvis was performed using the standard protocol following bolus administration of intravenous contrast. CONTRAST:  100mL ISOVUE-300 IOPAMIDOL (ISOVUE-300) INJECTION 61% COMPARISON:  None. FINDINGS: Lower chest: No acute abnormality. Hepatobiliary: There hepatic fluid. No focal liver lesions. Gallbladder is present. Pancreas: Unremarkable. No pancreatic ductal dilatation or surrounding inflammatory changes. Spleen: Normal in size without focal abnormality. Adrenals/Urinary Tract: Adrenal glands are normal. Small low-attenuation lesion within the midpole of the LEFT kidney is less than 1 centimeter not fully characterize. There is no hydronephrosis. The bladder and visualized portion of the urethra are normal. Stomach/Bowel: The stomach is normal in appearance. 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 There is marked  dilatation of small bowel loops. The most distal ileal loops are not dilated. There is significant wall thickening associated with central bowel loops and multiple angular changes in caliber, raising the question of adhesions. Within a mid to distal small bowel loop in the RIGHT central pelvis, there is radiopaque intraluminal structure measuring 10 millimeters and possibly representing a small ingested bone fragment. The colon is decompressed and otherwise normal in appearance. There is significant ascites and free intraperitoneal air with air-fluid level in the LEFT UPPER QUADRANT. There is enhancement of the peritoneum. Vascular/Lymphatic: No significant vascular findings are present. No enlarged abdominal or pelvic lymph nodes. Reproductive: Prostate is unremarkable. Other: Large volume ascites, free intraperitoneal air, and peritoneal  enhancement. Cachexia. Musculoskeletal: No acute or significant osseous findings. IMPRESSION: 1. High-grade small bowel obstruction.  Thickened small bowel loops. 2. Question of ingested foreign body within a distal small bowel loop in the RIGHT hemipelvis. See above. 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 4. Significant ascites, free intraperitoneal air, and peritoneal enhancement. Critical Value/emergent results were called by telephone at the time of interpretation on 10/25/2018 at 7:13 pm to provider [facility] , who verbally acknowledged these results. Electronically Signed   By: Norva Pavlov M.D.   On: 10/25/2018 19:18    EKG: Pending Old chart reviewed Discussed with EDP  Assessment/Plan 63 year old male with perforated bowel pneumoperitoneum of unclear etiology Principal Problem:   Pneumoperitoneum-patient not toxic without any peritoneal signs at this time.  Placed on IV Zosyn.  Will obtain preoperative EKG.  Patient extremely cachectic unclear if this is due to peptic ulcer disease or some other process such as underlying malignancy.  General surgery following.  Will pre-hydrate and follow general surgery recommendations.  May need EGD prior to surgery will defer to surgical team.  Active Problems:   PUD (peptic ulcer disease)-placed on Protonix drip    SBO (small bowel obstruction) (HCC)-NG tube for decompression and conservative management at this time management per general surgery    AKI (acute kidney injury) (HCC)-mild creatinine bump up to 1.3.  IV fluids overnight and repeat BMP in the morning    Cachexia (HCC)-noted checking prealbumin level    Moderate alcohol consumption-patient admits to 3 beers several times a week    GERD (gastroesophageal reflux disease)-noted    Protein-calorie malnutrition, severe (HCC)-noted     DVT prophylaxis: SCDs Code Status: Full Family Communication: None Disposition Plan: Days Consults called: General surgery Admission  status: Admission   Vincent Park A MD [facility]  If 7PM-7AM, please contact night-coverage [email or web address] Password [facility]  10/26/2018, 2:36 AM

## 2018-10-26 NOTE — Progress Notes (Signed)
Pharmacy Antibiotic Note  Hamlet Azoulay is a 63 y.o. male admitted on 10/25/2018 with Intra-abdominal infection.  Pharmacy has been consulted for zosyn dosing.  Plan: Zosyn 3.375g IV q8h (4 hour infusion).  Height: 6\' 2"  (188 cm) Weight: 119 lb 0.8 oz (54 kg) IBW/kg (Calculated) : 82.2  Temp (24hrs), Avg:98.3 F (36.8 C), Min:97.5 F (36.4 C), Max:99 F (37.2 C)  Recent Labs  Lab 10/25/18 1721  WBC 10.5  CREATININE 1.30*    Estimated Creatinine Clearance: 44.4 mL/min (A) (by C-G formula based on SCr of 1.3 mg/dL (H)).    No Known Allergies  Antimicrobials this admission: Zosyn 10/25/2018 >>   Dose adjustments this admission: -  Microbiology results: -  Thank you for allowing pharmacy to be a part of this patient's care.  Aleene Davidson Crowford 10/26/2018 2:45 AM

## 2018-10-26 NOTE — Progress Notes (Signed)
Patient seen examined  Agree with HPI as per Dr. Onalee Hua He has a perf Duodenum that will eventually need surgical intervention TPN has been started by surgery and it appears that they will discuss with family overall planning I will manage him until he is deemed fit for surgery and defer operative planning to [facility] team  Pleas Koch, MD [facility] 2:28 PM

## 2018-10-26 NOTE — Progress Notes (Signed)
[facility] Progress Note     Subjective: CC: pneumoperitoneum Patient currently denies abdominal pain at rest. Denies nausea. Patient reports he has lost a lot of weight and he does not know why. Had surgery for an ulcer in 2003. Takes no daily meds.   Lives with his son. 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 Smokes cigars occasionally. Drinks occasionally but denies every day alcohol consumption. Reports he may have smokes marijuana at times but is unsure.   Objective: Vital signs in last 24 hours: Temp:  [97.5 F (36.4 C)-99 F (37.2 C)] 97.7 F (36.5 C) (02/14 0532) Pulse Rate:  [86-114] 96 (02/14 0532) Resp:  [15-20] 15 (02/14 0532) BP: (107-138)/(55-74) 130/60 (02/14 0532) SpO2:  [97 %-100 %] 100 % (02/14 0532) Weight:  [48 kg-54 kg] 54 kg (02/13 2218) Last BM Date: 10/25/18  Intake/Output from previous day: 02/13 0701 - 02/14 0700 In: 1688.9 [P.O.:60; I.V.:432.7; IV Piggyback:1196.3] Out: 650 [Urine:550; Emesis/NG output:100] Intake/Output this shift: Total I/O In: -  Out: 200 [Urine:200]  PE: Gen:  Alert, NAD, severely cachectic  Card:  Regular rate and rhythm Pulm:  Normal effort, clear to auscultation bilaterally Abd: Soft, mild generalized TTP, mildly distended, bowel sounds hypoactive, midline scar with suture end eroding through the skin superiorly Skin: warm and dry, no rashes      Lab Results:  Recent Labs    10/25/18 1721 10/26/18 0500  WBC 10.5 8.5  HGB 12.5* 11.8*  HCT 38.0* 36.4*  PLT 276 319   BMET Recent Labs    10/25/18 1721 10/26/18 0500  NA 134* 137  K 5.1 5.1  CL 98 102  CO2 23 23  GLUCOSE 116* 91  BUN 59* 45*  CREATININE 1.30* 1.26*  CALCIUM 8.5* 8.5*   PT/INR Recent Labs    10/26/18 0500  LABPROT 14.4  INR 1.13   CMP     Component Value Date/Time   NA 137 10/26/2018 0500   K 5.1 10/26/2018 0500   CL 102 10/26/2018 0500   CO2 23 10/26/2018 0500   GLUCOSE 91 10/26/2018 0500   BUN 45  (H) 10/26/2018 0500   CREATININE 1.26 (H) 10/26/2018 0500   CALCIUM 8.5 (L) 10/26/2018 0500   PROT 7.0 10/25/2018 1721   ALBUMIN 2.2 (L) 10/25/2018 1721   AST 45 (H) 10/25/2018 1721   ALT 34 10/25/2018 1721   ALKPHOS 134 (H) 10/25/2018 1721   BILITOT 0.8 10/25/2018 1721   GFRNONAA >60 10/26/2018 0500   GFRAA >60 10/26/2018 0500   Lipase     Component Value Date/Time   LIPASE 30 10/25/2018 1721       Studies/Results: Ct Abdomen Pelvis W Contrast  Result Date: 10/25/2018 CLINICAL DATA:  63 y/o male with abd 'burning' x3 days. EXAM: CT ABDOMEN AND PELVIS WITH CONTRAST TECHNIQUE: Multidetector CT imaging of the abdomen and pelvis was performed using the standard protocol following bolus administration of intravenous contrast. CONTRAST:  ISOVUE-300 IOPAMIDOL (ISOVUE-300) INJECTION 61% COMPARISON:  None. FINDINGS: Lower chest: No acute abnormality. Hepatobiliary: There hepatic fluid. No focal liver lesions. Gallbladder is present. Pancreas: Unremarkable. No pancreatic ductal dilatation or surrounding inflammatory changes. Spleen: Normal in size without focal abnormality. Adrenals/Urinary Tract: Adrenal glands are normal. Small low-attenuation lesion within the midpole of the LEFT kidney is less than 1 centimeter not fully characterize. There is no hydronephrosis. The bladder and visualized portion of the urethra are normal. Stomach/Bowel: The stomach is normal in appearance. 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 There is marked dilatation of small bowel loops. The most distal ileal loops are not dilated. There is significant wall thickening associated with central bowel loops and multiple angular changes in caliber, raising the question of adhesions. Within a mid to distal small bowel loop in the RIGHT central pelvis, there is radiopaque intraluminal structure measuring 10 millimeters and possibly representing a small ingested bone  fragment. The colon is decompressed and otherwise normal in appearance. There is significant ascites and free intraperitoneal air with air-fluid level in the LEFT UPPER QUADRANT. There is enhancement of the peritoneum. Vascular/Lymphatic: No significant vascular findings are present. No enlarged abdominal or pelvic lymph nodes. Reproductive: Prostate is unremarkable. Other: Large volume ascites, free intraperitoneal air, and peritoneal enhancement. Cachexia. Musculoskeletal: No acute or significant osseous findings. IMPRESSION: 1. High-grade small bowel obstruction.  Thickened small bowel loops. 2. Question of ingested foreign body within a distal small bowel loop in the RIGHT hemipelvis. See above. 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 4. Significant ascites, free intraperitoneal air, and peritoneal enhancement. Critical Value/emergent results were called by telephone at the time of interpretation on 10/25/2018 at 7:13 pm to provider [facility] , who verbally acknowledged these results. Electronically Signed   By: Norva Pavlov M.D.   On: 10/25/2018 19:18   Dg Abd Portable 1v  Result Date: 10/26/2018 CLINICAL DATA:  NG tube placement EXAM: PORTABLE ABDOMEN - 1 VIEW COMPARISON:  CT 10/25/2018 FINDINGS: NG tube tip is in the mid stomach. Dilated centralized small bowel loops compatible with small bowel obstruction ascites as seen on prior CT. IMPRESSION: NG tube tip in the mid stomach. Electronically Signed   By: Charlett Nose M.D.   On: 10/26/2018 02:51    Anti-infectives: Anti-infectives (From admission, onward)   Start     Dose/Rate Route Frequency Ordered Stop   10/26/18 0400  piperacillin-tazobactam (ZOSYN) IVPB 3.375 g     3.375 g 12.5 mL/hr over 240 Minutes Intravenous Every 8 hours 10/26/18 0245     10/25/18 1930  piperacillin-tazobactam (ZOSYN) IVPB 3.375 g     3.375 g 100 mL/hr over 30 Minutes Intravenous  Once 10/25/18 1928 10/25/18 2021       Assessment/Plan Hx of perforated  gastric ulcer - s/p repair in 2003 Hx of medication non-compliance AKI  Free air with ascites and thickened bowel loops - patient is very mildly tender, does not have peritonitis - this could be secondary to obstruction vs recurrent ulcer disease - Dr. Daphine Deutscher to review imaging and workup and decide on possible OR today vs further workup  - patient is significantly malnourished with temporal wasting - may need nutritional support prior to or surrounding any operative intervention.  - continue bowel rest and IV abx for now  Severe protein calorie malnutrition - prealbumin <5, will need protein supplementation when able to take PO and may require TPN  FEN: NPO, IVF VTE: SCDs ID: [id number] 2/13>>   LOS: 1 day    Wells Guiles , [facility] 10/26/2018, 8:39 AM Pager: [phone] Consults: [phone]

## 2018-10-26 NOTE — Progress Notes (Signed)
Initial Nutrition Assessment  DOCUMENTATION CODES:   Underweight, Severe malnutrition in context of chronic illness  INTERVENTION:    TPN per pharmacy  Monitor for diet advancement/toleration  NUTRITION DIAGNOSIS:   Severe Malnutrition related to chronic illness(PUD with surgery) as evidenced by energy intake < or equal to 75% for > or equal to 1 month, severe fat depletion, severe muscle depletion, percent weight loss.  GOAL:   Patient will meet greater than or equal to 90% of their needs  MONITOR:   PO intake, Supplement acceptance, Weight trends, Labs, Diet advancement, I & O's  REASON FOR ASSESSMENT:   Consult New TPN/TNA  ASSESSMENT:   Patient with PMH significant for GERD, and perforated peptic ulcer disease (possible surgery?). Presents this admission with free air ascites with thicken bowel loops from unclear etiology.    Pt noticed a general decline in his intake starting one year ago. During this time he consumed two meals daily that consisted of a [place] sandwich with an Ensure for lunch and beef/pork with rice/beans for dinner. Pt does not like breakfast but instead will have an Ensure with Coffee. Pt drank one glass of wine or one beer occasionally at night but does not feel his intake is excessive. Over the last month, pt found it harder to eat regularly throughout the day (mostly bites at each meal). He is currently NPO with NGT to suction (100 ml observed in canister).   Pt endorses a UBW of 156 lb and a 50 lb wt loss over one year. Records indicate pt weighed 141 lb on 1/29 and 119 lb this admission (15.6% wt loss in two weeks, significant for time frame).   Medications reviewed and include: SS novolog Labs reviewed: CBG 91-166  NUTRITION - FOCUSED PHYSICAL EXAM:    Most Recent Value  Orbital Region  Severe depletion  Upper Arm Region  Severe depletion  Thoracic and Lumbar Region  Severe depletion  Buccal Region  Severe depletion  Temple Region  Severe  depletion  Clavicle Bone Region  Severe depletion  Clavicle and Acromion Bone Region  Severe depletion  Scapular Bone Region  Severe depletion  Dorsal Hand  Severe depletion  Patellar Region  Severe depletion  Anterior Thigh Region  Severe depletion  Posterior Calf Region  Severe depletion  Edema (RD Assessment)  None  Hair  Reviewed  Eyes  Reviewed  Mouth  Reviewed  Skin  Reviewed  Nails  Reviewed     Diet Order:   Diet Order            Diet NPO time specified Except for: Ice Chips  Diet effective now              EDUCATION NEEDS:   Not appropriate for education at this time  Skin:  Skin Assessment: Reviewed RN Assessment  Last BM:  2/13  Height:   Ht Readings from Last 1 Encounters:  10/25/18 6\' 2"  (1.88 m)    Weight:   Wt Readings from Last 1 Encounters:  10/25/18 54 kg    Ideal Body Weight:  86.4 kg  BMI:  Body mass index is 15.28 kg/m.  Estimated Nutritional Needs:   Kcal:  1900-2100 grams  Protein:  95-115 grams  Fluid:  >/= 1.9 L/day   Vanessa Kick RD, LDN Clinical Nutrition Pager # - [phone]

## 2018-10-26 NOTE — Progress Notes (Signed)
At 1115, CCS Wells Guiles, PA was paged regarding the pt's and family request to speak to Dr. Daphine Deutscher.   Wells Guiles, PA returned my page and qdvised that Dr. Daphine Deutscher will return to talk with the pt and pt's family in a few hours.   The pt and pt's family has been notified.

## 2018-10-26 NOTE — Progress Notes (Signed)
PHARMACY - ADULT TOTAL PARENTERAL NUTRITION CONSULT NOTE   Pharmacy Consult for TPN Indication: bowel rest, severe malnutrition PTA  Patient Measurements: Height: 6' 2"  (188 cm) Weight: 119 lb 0.8 oz (54 kg) IBW/kg (Calculated) : 82.2 TPN AdjBW (KG): 54 Body mass index is 15.28 kg/m. Usual Weight: unknown as patient is poor historian, but significantly higher than current weight  HPI: 54 yoM with PMH PUD/GERD presents with worsening chronic abdominal pain and associated weight loss. Cachectic appearing. CT reveals ascites with chronic SBO vs recurrent ulcer disease. NPO with plans for surgery vs conservative management. Surgery requesting TPN to start in the meantime.  Central access: PICC (pending placement) TPN start date: 2/14 (pending PICC placement  Significant events:   ASSESSMENT                                                                                                           Current Nutrition: NPO  IVF: NS at 125  Today, 10/26/2018:  Glucose (CBG goal 100-150) - CBGs normal prior to TPN  Electrolytes - K slightly elevated, others WNL  Renal - Mild AKI on admission but SCr now trending back down, bicarb normal, BUN:SCr elevated; UOP adequate  LFTs - Alk phos elevated and Albumin low, o/w normal  TGs - pending  Prealbumin - <5 (chronic malnutrition)  NUTRITIONAL GOALS                                                                                             RD recs: pending  PLAN                                                                                                                         At 1800 today:  Will consider very high risk for refeeding syndrome  Initiate TPN at 30 ml/hr; plan to advance as tolerated to the goal rate  Current TPN provides 36 g of protein, 108 g of dextrose, and 22 g of lipids for a total of 767 kcals - nutritional goals pending RD recommendations  Add MVI, trace elements to TPN  Electrolytes in TPN: removed K,  reduced Mg, others WNL; Cl:Ac 1:1  Reduce IVF to 100 ml/hr  Initiate sensitive SSI with q6 hr CBG checks  TPN lab panels on Mondays & Thursdays; full panel tomorrow AM  Reuel Boom, PharmD, BCPS [phone] 10/26/2018, 11:14 AM

## 2018-10-26 NOTE — Progress Notes (Signed)
Peripherally Inserted Central Catheter/Midline Placement  The IV Nurse has discussed with the patient and/or persons authorized to consent for the patient, the purpose of this procedure and the potential benefits and risks involved with this procedure.  The benefits include less needle sticks, lab draws from the catheter, and the patient may be discharged home with the catheter. Risks include, but not limited to, infection, bleeding, blood clot (thrombus formation), and puncture of an artery; nerve damage and irregular heartbeat and possibility to perform a PICC exchange if needed/ordered by physician.  Alternatives to this procedure were also discussed.  Bard Power PICC patient education guide, fact sheet on infection prevention and patient information card has been provided to patient /or left at bedside.    PICC/Midline Placement Documentation  PICC Double Lumen 10/26/18 Right Brachial 40 cm 0 cm (Active)  Indication for Insertion or Continuance of Line Administration of hyperosmolar/irritating solutions (i.e. TPN, Vancomycin, etc.) 10/26/2018  3:07 PM  Exposed Catheter (cm) 0 cm 10/26/2018  3:07 PM  Site Assessment Clean;Dry;Intact 10/26/2018  3:07 PM  Lumen #1 Status Flushed;Saline locked;Blood return noted 10/26/2018  3:07 PM  Lumen #2 Status Flushed;Saline locked;Blood return noted 10/26/2018  3:07 PM  Dressing Type Transparent 10/26/2018  3:07 PM  Dressing Status Clean;Dry;Intact;Antimicrobial disc in place 10/26/2018  3:07 PM  Dressing Change Due 11/02/18 10/26/2018  3:07 PM       Ethelda Chick 10/26/2018, 3:08 PM

## 2018-10-26 NOTE — Progress Notes (Signed)
PHARMACY NOTE -  Zosyn  Pharmacy has been assisting with dosing of Zosyn for intra-abdominal infection.  Dosage remains stable at 3.375 g IV q8 hr and need for further dosage adjustment appears unlikely at present given improving AKI  Pharmacy will sign off, following peripherally for culture results or dose adjustments. Please reconsult if a change in clinical status warrants re-evaluation of dosage.  Bernadene Person, PharmD, BCPS [phone] 10/26/2018, 10:47 AM   \

## 2018-10-27 ENCOUNTER — Inpatient Hospital Stay (HOSPITAL_COMMUNITY): Payer: Self-pay

## 2018-10-27 LAB — CBC WITH DIFFERENTIAL/PLATELET
Abs Immature Granulocytes: 0.11 10*3/uL — ABNORMAL HIGH (ref 0.00–0.07)
Basophils Absolute: 0 10*3/uL (ref 0.0–0.1)
Basophils Relative: 0 %
Eosinophils Absolute: 0 10*3/uL (ref 0.0–0.5)
Eosinophils Relative: 0 %
HCT: 31.6 % — ABNORMAL LOW (ref 39.0–52.0)
Hemoglobin: 10.3 g/dL — ABNORMAL LOW (ref 13.0–17.0)
Immature Granulocytes: 2 %
LYMPHS PCT: 13 %
Lymphs Abs: 0.8 10*3/uL (ref 0.7–4.0)
MCH: 34.2 pg — ABNORMAL HIGH (ref 26.0–34.0)
MCHC: 32.6 g/dL (ref 30.0–36.0)
MCV: 105 fL — ABNORMAL HIGH (ref 80.0–100.0)
Monocytes Absolute: 0.4 10*3/uL (ref 0.1–1.0)
Monocytes Relative: 6 %
Neutro Abs: 4.8 10*3/uL (ref 1.7–7.7)
Neutrophils Relative %: 79 %
Platelets: 267 10*3/uL (ref 150–400)
RBC: 3.01 MIL/uL — ABNORMAL LOW (ref 4.22–5.81)
RDW: 14 % (ref 11.5–15.5)
WBC: 6.1 10*3/uL (ref 4.0–10.5)
nRBC: 0 % (ref 0.0–0.2)

## 2018-10-27 LAB — COMPREHENSIVE METABOLIC PANEL
ALT: 29 U/L (ref 0–44)
AST: 39 U/L (ref 15–41)
Albumin: 1.7 g/dL — ABNORMAL LOW (ref 3.5–5.0)
Alkaline Phosphatase: 97 U/L (ref 38–126)
Anion gap: 7 (ref 5–15)
BUN: 33 mg/dL — ABNORMAL HIGH (ref 8–23)
CO2: 25 mmol/L (ref 22–32)
Calcium: 8 mg/dL — ABNORMAL LOW (ref 8.9–10.3)
Chloride: 110 mmol/L (ref 98–111)
Creatinine, Ser: 1 mg/dL (ref 0.61–1.24)
GFR calc non Af Amer: >60 mL/min (ref >60)
Glucose, Bld: 108 mg/dL — ABNORMAL HIGH (ref 70–99)
Potassium: 3.7 mmol/L (ref 3.5–5.1)
SODIUM: 142 mmol/L (ref 135–145)
Total Bilirubin: 0.5 mg/dL (ref 0.3–1.2)
Total Protein: 5.8 g/dL — ABNORMAL LOW (ref 6.5–8.1)

## 2018-10-27 LAB — GLUCOSE, CAPILLARY
GLUCOSE-CAPILLARY: 88 mg/dL (ref 70–99)
GLUCOSE-CAPILLARY: 86 mg/dL (ref 70–99)
Glucose-Capillary: 92 mg/dL (ref 70–99)
Glucose-Capillary: 95 mg/dL (ref 70–99)
Glucose-Capillary: 86 mg/dL (ref 70–99)

## 2018-10-27 LAB — MAGNESIUM: Magnesium: 2.2 mg/dL (ref 1.7–2.4)

## 2018-10-27 LAB — PHOSPHORUS: Phosphorus: 3.4 mg/dL (ref 2.5–4.6)

## 2018-10-27 LAB — TRIGLYCERIDES: Triglycerides: 120 mg/dL (ref <150)

## 2018-10-27 MED ORDER — TRAVASOL 10 % IV SOLN
INTRAVENOUS | Status: AC
  Administered 2018-10-27: 18:00:00 via INTRAVENOUS
  Filled 2018-10-27: qty 396

## 2018-10-27 MED ORDER — POTASSIUM CHLORIDE 10 MEQ/50ML IV SOLN
10.0000 meq | INTRAVENOUS | Status: AC
  Administered 2018-10-27 (×4): 10 meq via INTRAVENOUS
  Filled 2018-10-27 (×4): qty 50

## 2018-10-27 NOTE — Progress Notes (Signed)
PHARMACY - ADULT TOTAL PARENTERAL NUTRITION CONSULT NOTE   Pharmacy Consult for TPN Indication: bowel rest, severe malnutrition PTA  Patient Measurements: Height: 6' 2"  (188 cm) Weight: 120 lb 13 oz (54.8 kg) IBW/kg (Calculated) : 82.2 TPN AdjBW (KG): 54 Body mass index is 15.51 kg/m. Usual Weight: unknown as patient is poor historian, but reports significant weight loss over the past year  HPI: 89 yoM with PMH PUD/GERD presents with worsening chronic abdominal pain and associated weight loss. Cachectic appearing. CT reveals ascites with chronic SBO vs recurrent ulcer disease. NPO with plans for surgery vs conservative management. Surgery requesting TPN to start in the meantime.  Central access: Double lumen PICC placed 2/14 TPN start date: 2/14  Significant events:   ASSESSMENT                                                                                               Current Nutrition: NPO except for ice chips  IVF: NS at 100 mL/hr  Today, 10/27/2018:  Glucose (CBG goal 100-150) - CBGs normal prior to TPN. BG 88-108 range over past 24 hours. No SSI required.  Electrolytes - All WNL, but K and Phos decreased with initiation of TPN.  Na (142), K (3.7), Cl (110), Corrected calcium (9.8), Phos (3.4), Mg (2.2)  Renal - Mild AKI on admission but SCr now WNL  Bicarb normal, BUN trending down; UOP adequate  LFTs, Alk Phos WNL. Albumin (1.7) low  TGs - WNL (120)  Prealbumin - <5 (low, chronic malnutrition)  NUTRITIONAL GOALS                                                                                 RD recs (per note on 2/14): Kcal: 1900 - 2100 grams Protein: 95 - 115 g Fluid: >/= 1.9 L/day  Custom TPN at goal rate of 80 mL/hr will provide: 2070 kcal, 105 g protein, and 326 g dextrose, meeting 100% of patient needs    PLAN                                                                                                               Now:   KCl 10 mEq IV x 4  runs  At 1800 today:  Continue TPN at 30 mL/hr given high risk of refeeding syndrome; plan to advance as  tolerated to the goal rate  TPN to contain standard MVI and trace elements  TPN to contain standard electrolyte concentrations - adding K to TPN. Cl:Ac 1:1.   Continue IVF at 100 ml/hr  Continue sensitive SSI with q6 hr CBG checks  Will recheck electrolytes with AM labs tomorrow  TPN lab panels on Mondays & Thursdays  Lenis Noon, PharmD 10/27/18 10:43 AM

## 2018-10-27 NOTE — Progress Notes (Addendum)
[facility] Progress Note     Subjective: CC: pneumoperitoneum Patient currently denies abdominal pain at rest. Denies nausea. Patient reports he has lost a lot of weight and he does not know why. Had surgery for an ulcer in 2003 [place]. Takes no daily meds- he states he was prescribed Prevacid but has not been able to take this for the last year or so that's about how long he has had recurrent symptoms..   Lives with his son. He moved here from [place] 20 years ago. 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 Smokes cigars occasionally. Drinks occasionally but denies every day alcohol consumption. Reports he may have smokes marijuana at times but is unsure.   Objective: Vital signs in last 24 hours: Temp:  [98 F (36.7 C)-98.3 F (36.8 C)] 98.3 F (36.8 C) (02/15 0636) Pulse Rate:  [88-94] 88 (02/15 0636) Resp:  [16] 16 (02/15 0636) BP: (125-139)/(60-65) 125/60 (02/15 0636) SpO2:  [99 %-100 %] 99 % (02/15 0636) Weight:  [54.8 kg] 54.8 kg (02/14 1258) Last BM Date: 10/25/18  Intake/Output from previous day: 02/14 0701 - 02/15 0700 In: 3123.1 [P.O.:180; I.V.:2798.6; IV Piggyback:144.5] Out: 1030 [Urine:980; Emesis/NG output:50] Intake/Output this shift: No intake/output data recorded.  PE: Gen:  Alert, NAD, severely cachectic  Card:  Regular rate and rhythm Pulm:  Normal effort, clear to auscultation bilaterally Abd: Soft, mild generalized TTP, mildly distended, bowel sounds hypoactive, midline scar with suture end eroding through the skin superiorly Skin: warm and dry, no rashes  NG tube in place with bilious output     Lab Results:  Recent Labs    10/26/18 0500 10/27/18 0542  WBC 8.5 PENDING  HGB 11.8* 10.3*  HCT 36.4* 31.6*  PLT 319 267   BMET Recent Labs    10/26/18 0500 10/27/18 0542  NA 137 142  K 5.1 3.7  CL 102 110  CO2 23 25  GLUCOSE 91 108*  BUN 45* 33*  CREATININE 1.26* 1.00  CALCIUM 8.5* 8.0*   PT/INR Recent Labs     10/26/18 0500  LABPROT 14.4  INR 1.13   CMP     Component Value Date/Time   NA 142 10/27/2018 0542   K 3.7 10/27/2018 0542   CL 110 10/27/2018 0542   CO2 25 10/27/2018 0542   GLUCOSE 108 (H) 10/27/2018 0542   BUN 33 (H) 10/27/2018 0542   CREATININE 1.00 10/27/2018 0542   CALCIUM 8.0 (L) 10/27/2018 0542   PROT 5.8 (L) 10/27/2018 0542   ALBUMIN 1.7 (L) 10/27/2018 0542   AST 39 10/27/2018 0542   ALT 29 10/27/2018 0542   ALKPHOS 97 10/27/2018 0542   BILITOT 0.5 10/27/2018 0542   GFRNONAA >60 10/27/2018 0542   GFRAA >60 10/27/2018 0542   Lipase     Component Value Date/Time   LIPASE 30 10/25/2018 1721       Studies/Results: Ct Abdomen Pelvis W Contrast  Result Date: 10/25/2018 CLINICAL DATA:  63 y/o male with abd 'burning' x3 days. EXAM: CT ABDOMEN AND PELVIS WITH CONTRAST TECHNIQUE: Multidetector CT imaging of the abdomen and pelvis was performed using the standard protocol following bolus administration of intravenous contrast. CONTRAST:  ISOVUE-300 IOPAMIDOL (ISOVUE-300) INJECTION 61% COMPARISON:  None. FINDINGS: Lower chest: No acute abnormality. Hepatobiliary: There hepatic fluid. No focal liver lesions. Gallbladder is present. Pancreas: Unremarkable. No pancreatic ductal dilatation or surrounding inflammatory changes. Spleen: Normal in size without focal abnormality. Adrenals/Urinary Tract: Adrenal glands are normal. Small low-attenuation lesion  within the midpole of the LEFT kidney is less than 1 centimeter not fully characterize. There is no hydronephrosis. The bladder and visualized portion of the urethra are normal. Stomach/Bowel: The stomach is normal in appearance. 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 There is marked dilatation of small bowel loops. The most distal ileal loops are not dilated. There is significant wall thickening associated with central bowel loops and multiple angular changes in caliber,  raising the question of adhesions. Within a mid to distal small bowel loop in the RIGHT central pelvis, there is radiopaque intraluminal structure measuring 10 millimeters and possibly representing a small ingested bone fragment. The colon is decompressed and otherwise normal in appearance. There is significant ascites and free intraperitoneal air with air-fluid level in the LEFT UPPER QUADRANT. There is enhancement of the peritoneum. Vascular/Lymphatic: No significant vascular findings are present. No enlarged abdominal or pelvic lymph nodes. Reproductive: Prostate is unremarkable. Other: Large volume ascites, free intraperitoneal air, and peritoneal enhancement. Cachexia. Musculoskeletal: No acute or significant osseous findings. IMPRESSION: 1. High-grade small bowel obstruction.  Thickened small bowel loops. 2. Question of ingested foreign body within a distal small bowel loop in the RIGHT hemipelvis. See above. 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 4. Significant ascites, free intraperitoneal air, and peritoneal enhancement. Critical Value/emergent results were called by telephone at the time of interpretation on 10/25/2018 at 7:13 pm to provider [facility] , who verbally acknowledged these results. Electronically Signed   By: Norva Pavlov M.D.   On: 10/25/2018 19:18   Dg Abd Portable 1v  Result Date: 10/26/2018 CLINICAL DATA:  NG tube placement EXAM: PORTABLE ABDOMEN - 1 VIEW COMPARISON:  CT 10/25/2018 FINDINGS: NG tube tip is in the mid stomach. Dilated centralized small bowel loops compatible with small bowel obstruction ascites as seen on prior CT. IMPRESSION: NG tube tip in the mid stomach. Electronically Signed   By: Charlett Nose M.D.   On: 10/26/2018 02:51   [place] Ekg Site Rite  Result Date: 10/26/2018 If Site Rite image not attached, placement could not be confirmed due to current cardiac rhythm.   Anti-infectives: Anti-infectives (From admission, onward)   Start     Dose/Rate Route  Frequency Ordered Stop   10/26/18 0400  piperacillin-tazobactam (ZOSYN) IVPB 3.375 g     3.375 g 12.5 mL/hr over 240 Minutes Intravenous Every 8 hours 10/26/18 0245     10/25/18 1930  piperacillin-tazobactam (ZOSYN) IVPB 3.375 g     3.375 g 100 mL/hr over 30 Minutes Intravenous  Once 10/25/18 1928 10/25/18 2021       Assessment/Plan Hx of perforated gastric ulcer - s/p repair in 2003 Hx of medication non-compliance AKI- improving   Free air with ascites and thickened bowel loops - patient is very mildly tender, does not have peritonitis - this could be secondary to obstruction vs recurrent ulcer disease - patient is significantly malnourished with temporal wasting - needs TPN and nutritional support prior to any operative intervention - continue PPI drip, bowel rest and IV abx for now - If he continues to remain afebrile/ no wbc/ minimal pain, he may be someone we can reimage w water soluble UGI to assess for ongoing vs sealed leak +/- Ct to assess for drainable abscess, and avoid surgical intervention in the acute setting.  Severe protein calorie malnutrition - prealbumin <5, continue TPN for now  FEN: NPO, IVF VTE: SCDs ID: [email or web address] 2/13>>   LOS: 2 days  Berna Buehelsea A Brittne Kawasaki , MD [facility] 10/27/2018, 7:04 AM

## 2018-10-27 NOTE — Progress Notes (Signed)
[facility] PROGRESS NOTE  Vincent Park ENI:[id number] DOB: 1956/02/04 DOA: 10/25/2018 PCP: Patient, No Pcp Per   Narrative: 63 year old [place] male POD with perforated ulcer 2003 Reflux Chronic low level ethanolism Admitted 2/13 because of abdominal pain over the past year worsening over 2 weeks CT scan showed dilated thickened small bowel with transition point for obstruction with free air NG tube placed General surgery consulted   A & Plan Perforated viscus Not optimized for surgery-continue TPN and NG tube-defer to general surgery for further management Continue Zosyn as well as Protonix GTT Continue saline 100 cc/h Presumed acute kidney injury Previous BUN/creatinine low on admission 59/1.3-resolving Severe malnutrition BMI 15 Getting TPN Continue insulin coverage of the same  DVT Lovenox code Status: Full code communication: Discussed with the son who understands there is a bowel obstruction and patient may require surgery and understands also that this would not happen emergently l  Disposition Plan: Inpatient at this time   Mahala Menghini, MD  [facility] Via amion app OR [email or web address] 7PM-7AM contact night coverage as above 10/27/2018, 3:06 PM  LOS: 2 days   Consultants:  General surgery  Procedures:  CT scan  Antimicrobials:  Zosyn  Interval history/Subjective: Awake alert pleasant no distress Denies any overt pain No nausea no vomiting NG tube in place  Objective:  Vitals:  Vitals:   10/27/18 0636 10/27/18 1342  BP: 125/60 (!) 120/57  Pulse: 88 88  Resp: 16 16  Temp: 98.3 F (36.8 C) 98.3 F (36.8 C)  SpO2: 99% 98%    Exam:  EOMI NCAT no icterus no pallor Chest is clear no added sound Abdomen is soft scaphoid no rebound no guarding Neurologically intact looking very wasted however   I have personally reviewed the following:  DATA   Labs:  BUN/creatinine down from 59/1 0.3-30 3/1.0  Mag Phos normalized  Hemoglobin  down from 12.5 on admission to 10.3  Imaging studies:  CXR 2/15 showed right basilar atelectasis  Medical tests:  No  Test discussed with performing physician:  No  Decision to obtain old records:  No  Review and summation of old records:  Yes  Scheduled Meds: . insulin aspart  0-9 Units Subcutaneous Q6H  . sodium chloride flush  10-40 mL Intracatheter Q12H   Continuous Infusions: . sodium chloride 100 mL/hr at 10/27/18 1155  . pantoprozole (PROTONIX) infusion 8 mg/hr (10/27/18 1246)  . piperacillin-tazobactam (ZOSYN)  IV 3.375 g (10/27/18 0544)  . potassium chloride 10 mEq (10/27/18 1429)  . TPN ADULT (ION) 30 mL/hr at 10/26/18 1744  . TPN ADULT (ION)      Principal Problem:   Pneumoperitoneum Active Problems:   AKI (acute kidney injury) (HCC)   Cachexia (HCC)   PUD (peptic ulcer disease)   SBO (small bowel obstruction) (HCC)   Moderate alcohol consumption   GERD (gastroesophageal reflux disease)   Protein-calorie malnutrition, severe (HCC)   LOS: 2 days

## 2018-10-28 LAB — BASIC METABOLIC PANEL
Anion gap: 8 (ref 5–15)
BUN: 23 mg/dL (ref 8–23)
CHLORIDE: 113 mmol/L — AB (ref 98–111)
CO2: 24 mmol/L (ref 22–32)
Calcium: 7.9 mg/dL — ABNORMAL LOW (ref 8.9–10.3)
Creatinine, Ser: 0.99 mg/dL (ref 0.61–1.24)
GFR calc Af Amer: >60 mL/min (ref >60)
GFR calc non Af Amer: >60 mL/min (ref >60)
Glucose, Bld: 103 mg/dL — ABNORMAL HIGH (ref 70–99)
Potassium: 3.7 mmol/L (ref 3.5–5.1)
SODIUM: 145 mmol/L (ref 135–145)

## 2018-10-28 LAB — GLUCOSE, CAPILLARY
Glucose-Capillary: 104 mg/dL — ABNORMAL HIGH (ref 70–99)
Glucose-Capillary: 99 mg/dL (ref 70–99)
Glucose-Capillary: 90 mg/dL (ref 70–99)

## 2018-10-28 LAB — MAGNESIUM: MAGNESIUM: 2.1 mg/dL (ref 1.7–2.4)

## 2018-10-28 LAB — PHOSPHORUS: Phosphorus: 3 mg/dL (ref 2.5–4.6)

## 2018-10-28 MED ORDER — SODIUM CHLORIDE 0.9 % IV SOLN
INTRAVENOUS | Status: AC
  Administered 2018-10-28 – 2018-10-29 (×2): via INTRAVENOUS

## 2018-10-28 MED ORDER — TRAVASOL 10 % IV SOLN
INTRAVENOUS | Status: AC
  Administered 2018-10-28: 17:00:00 via INTRAVENOUS
  Filled 2018-10-28: qty 660

## 2018-10-28 MED ORDER — POTASSIUM CHLORIDE 10 MEQ/50ML IV SOLN
10.0000 meq | INTRAVENOUS | Status: AC
  Administered 2018-10-28 (×3): 10 meq via INTRAVENOUS
  Filled 2018-10-28 (×3): qty 50

## 2018-10-28 NOTE — Progress Notes (Signed)
[facility] PROGRESS NOTE   Vincent Park QPY:[id number] DOB: 09-04-1956 DOA: 10/25/2018 PCP: Patient, No Pcp Per   Narrative: 63 year old [place] male perforated ulcer 2003 s/p repair Reflux Chronic low level ethanolism Admitted 2/13 because of abdominal pain over the past year worsening over 2 weeks CT scan showed dilated thickened small bowel with transition point for obstruction with free air NG tube placed General surgery consulted   A & Plan Perforated viscus Not optimized for surgery-continue TPN and NG tube-? Planning for UGI water soluble to determine if there is a surgical need Continue Zosyn as well as Protonix GTT Continue saline 100 cc/h Presumed acute kidney injury Previous BUN/creatinine low on admission 59/1.3-on admit--resolving Severe malnutrition BMI 15 Getting TPN Continue insulin coverage of the same--sugars 90-110  DVT Lovenox code Status: Full code communication: no family today at bedside-updated son on 2/15  Disposition Plan: Inpatient at this time   Mahala Menghini, MD  [facility] Via [employer] app OR [email or web address] 7PM-7AM contact night coverage as above 10/28/2018, 12:20 PM  LOS: 3 days   Consultants:  General surgery  Procedures:  CT scan  Antimicrobials:  Zosyn  Interval history/Subjective:  No overt pain No fever No chills Some mild abd pain  Objective:  Vitals:  Vitals:   10/27/18 2317 10/28/18 0539  BP: (!) 147/72 (!) 149/81  Pulse: 90 90  Resp: 16 16  Temp: 98 F (36.7 C) 98 F (36.7 C)  SpO2: 100% 100%    Exam:  EOMI NCAT no icterus no pallor very asthenic Chest is clear no added sound Abdomen is soft scaphoid no rebound no guarding area in epigastrium is dark but non tender Neurologically intact looking very wasted however   I have personally reviewed the following:  DATA   Labs:  BUN/creatinine down from 59/1.3--->23/0.99  Mag Phos normalized  Imaging studies:  CXR 2/15 showed right basilar  atelectasis  Medical tests:  No  Test discussed with performing physician:  No  Decision to obtain old records:  No  Review and summation of old records:  Yes  Scheduled Meds: . insulin aspart  0-9 Units Subcutaneous Q6H  . sodium chloride flush  10-40 mL Intracatheter Q12H   Continuous Infusions: . sodium chloride 100 mL/hr at 10/28/18 0719  . sodium chloride    . pantoprozole (PROTONIX) infusion 8 mg/hr (10/28/18 1017)  . piperacillin-tazobactam (ZOSYN)  IV 3.375 g (10/28/18 0536)  . potassium chloride 10 mEq (10/28/18 1021)  . TPN ADULT (ION) 30 mL/hr at 10/27/18 1828  . TPN ADULT (ION)      Principal Problem:   Pneumoperitoneum Active Problems:   AKI (acute kidney injury) (HCC)   Cachexia (HCC)   PUD (peptic ulcer disease)   SBO (small bowel obstruction) (HCC)   Moderate alcohol consumption   GERD (gastroesophageal reflux disease)   Protein-calorie malnutrition, severe (HCC)   LOS: 3 days

## 2018-10-28 NOTE — Progress Notes (Signed)
PHARMACY - ADULT TOTAL PARENTERAL NUTRITION CONSULT NOTE   Pharmacy Consult for TPN Indication: bowel rest, severe malnutrition PTA  Patient Measurements: Height: '6\' 2"'$  (188 cm) Weight: 120 lb 13 oz (54.8 kg) IBW/kg (Calculated) : 82.2 TPN AdjBW (KG): 54 Body mass index is 15.51 kg/m. Usual Weight: unknown as patient is poor historian, but reports significant weight loss over the past year  HPI: 70 yoM with PMH PUD with perforated gastric ulcer s/p repair in 2003, GERD presents with worsening chronic abdominal pain and associated weight loss. Cachectic appearing. CT reveals ascites with concern for SBO vs recurrent ulcer disease. Pt is NPO. Consulted by surgery for TPN management. Pt will require nutritional support prior to any surgical intervention.  Central access: Double lumen PICC placed 2/14 TPN start date: 2/14  Significant events:   ASSESSMENT                                                                                               Current Nutrition: NPO except for ice chips  IVF: NS at 100 mL/hr  Today, 10/28/2018:  Glucose (CBG goal 100-150) - CBGs normal prior to TPN. BG 86-104 range over past 24 hours. No SSI required.  Electrolytes - WNL except Cl slightly elevated. K WNL but below goal of 4 with concern for SBO.  Na (145), K (3.7), Cl (113), Corrected calcium (9.7), Phos (3), Mg (2.1)  Renal - Mild AKI on admission - resolved. SCr now WNL  Bicarb normal, BUN trended down to WNL  LFTs, Alk Phos - WNL. Albumin (1.7) low  TGs - WNL (120)  Prealbumin - <5 (low, chronic malnutrition)  NUTRITIONAL GOALS                                                                                 RD recs (per note on 2/14): Kcal: 1900 - 2100 grams Protein: 95 - 115 g Fluid: >/= 1.9 L/day  Custom TPN at goal rate of 80 mL/hr will provide: 2070 kcal, 105 g protein, and 326 g dextrose, meeting 100% of patient needs    PLAN                                                                                                                Now:   KCl 10 mEq IV x 3 runs  At 1800 today:  Advance TPN to 50 mL/hr as electrolytes WNL. Given risk for refeeding syndrome, will advance slowly. Plan to advance as tolerated to the goal rate  TPN to contain standard MVI and trace elements  TPN to contain standard electrolyte concentrations with the exception of slightly increased phosphorus and potassium. Cl:Ac 1:2.   Reduce IVF to 80 mL/hr  Continue sensitive SSI with q6 hr CBG checks  Will recheck electrolytes with AM labs tomorrow  TPN lab panels on Mondays & Thursdays  Lenis Noon, PharmD 10/28/18 10:29 AM

## 2018-10-28 NOTE — Progress Notes (Signed)
[facility] Progress Note     Subjective: CC: pneumoperitoneum Patient currently denies abdominal pain at rest. Denies nausea. Patient reports he has lost a lot of weight and he does not know why. Had surgery for an ulcer in 2003 [place]. Takes no daily meds- he states he was prescribed Prevacid but has not been able to take this for the last year or so that's about how long he has had recurrent symptoms..   Lives with his son. He moved here from [place] 20 years ago. 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 Smokes cigars occasionally. Drinks occasionally but denies every day alcohol consumption.    Objective: Vital signs in last 24 hours: Temp:  [98 F (36.7 C)-98.3 F (36.8 C)] 98 F (36.7 C) (02/16 0539) Pulse Rate:  [88-90] 90 (02/16 0539) Resp:  [16] 16 (02/16 0539) BP: (120-149)/(57-81) 149/81 (02/16 0539) SpO2:  [98 %-100 %] 100 % (02/16 0539) Last BM Date: 10/25/18  Intake/Output from previous day: 02/15 0701 - 02/16 0700 In: 4292.9 [P.O.:720; I.V.:3348.3; IV Piggyback:224.7] Out: 1425 [Urine:1125; Emesis/NG output:300] Intake/Output this shift: Total I/O In: -  Out: 600 [Emesis/NG output:600]  PE: Gen:  Alert, NAD, severely cachectic  Card:  Regular rate and rhythm Pulm:  Normal effort, clear to auscultation bilaterally Abd: Soft, mild generalized TTP, mildly distended, bowel sounds hypoactive, midline scar with suture end eroding through the skin superiorly Skin: warm and dry, no rashes  NG tube in place with bilious output     Lab Results:  Recent Labs    10/26/18 0500 10/27/18 0542  WBC 8.5 6.1  HGB 11.8* 10.3*  HCT 36.4* 31.6*  PLT 319 267   BMET Recent Labs    10/27/18 0542 10/28/18 0436  NA 142 145  K 3.7 3.7  CL 110 113*  CO2 25 24  GLUCOSE 108* 103*  BUN 33* 23  CREATININE 1.00 0.99  CALCIUM 8.0* 7.9*   PT/INR Recent Labs    10/26/18 0500  LABPROT 14.4  INR 1.13   CMP     Component Value Date/Time   NA 145 10/28/2018 0436   K 3.7 10/28/2018 0436   CL 113 (H) 10/28/2018 0436   CO2 24 10/28/2018 0436   GLUCOSE 103 (H) 10/28/2018 0436   BUN 23 10/28/2018 0436   CREATININE 0.99 10/28/2018 0436   CALCIUM 7.9 (L) 10/28/2018 0436   PROT 5.8 (L) 10/27/2018 0542   ALBUMIN 1.7 (L) 10/27/2018 0542   AST 39 10/27/2018 0542   ALT 29 10/27/2018 0542   ALKPHOS 97 10/27/2018 0542   BILITOT 0.5 10/27/2018 0542   GFRNONAA >60 10/28/2018 0436   GFRAA >60 10/28/2018 0436   Lipase     Component Value Date/Time   LIPASE 30 10/25/2018 1721       Studies/Results: Dg Chest 2 View  Result Date: 10/27/2018 CLINICAL DATA:  Dyspnea on exertion. EXAM: CHEST - 2 VIEW COMPARISON:  One view abdomen 10/26/2018.  Abdominal CT 10/25/2018. FINDINGS: 0920 hours. Right arm PICC extends to the superior cavoatrial junction. Nasogastric tube is in place, tip not visualized on the frontal examination. On the lateral view, the NG tube extends in the proximal stomach. The heart size and mediastinal contours are normal. There is minimal right base atelectasis. The lungs are otherwise clear. There is no pleural effusion or pneumothorax. IMPRESSION: Minimal right basilar atelectasis. No other active cardiopulmonary process. Electronically Signed   By: Carey Bullocks M.D.   On: 10/27/2018 13:20  [place] Ekg Site Rite  Result Date: 10/26/2018 If Site Rite image not attached, placement could not be confirmed due to current cardiac rhythm.   Anti-infectives: Anti-infectives (From admission, onward)   Start     Dose/Rate Route Frequency Ordered Stop   10/26/18 0400  piperacillin-tazobactam (ZOSYN) IVPB 3.375 g     3.375 g 12.5 mL/hr over 240 Minutes Intravenous Every 8 hours 10/26/18 0245     10/25/18 1930  piperacillin-tazobactam (ZOSYN) IVPB 3.375 g     3.375 g 100 mL/hr over 30 Minutes Intravenous  Once 10/25/18 1928 10/25/18 2021       Assessment/Plan Hx of perforated gastric ulcer - s/p repair in 2003 Hx of  medication non-compliance AKI- improving   Free air with ascites and thickened bowel loops - Benign exam and no signs of sepsis - this is likely subacute - this could be secondary to obstruction vs recurrent ulcer disease - patient is significantly malnourished with temporal wasting - needs TPN and nutritional support prior to any operative intervention - continue PPI drip, bowel rest and IV abx for now - If he continues to remain afebrile/ no wbc/ minimal pain, he may be someone we can reimage w water soluble UGI to assess for ongoing vs sealed leak +/- Ct to assess for drainable abscess, and avoid surgical intervention in the acute setting.  Severe protein calorie malnutrition - prealbumin <5, continue TPN for now  FEN: NPO, IVF VTE: SCDs ID: [email or web address] 2/13>>   LOS: 3 days    Berna Bue , MD [facility] 10/28/2018, 8:24 AM

## 2018-10-29 LAB — DIFFERENTIAL
Abs Immature Granulocytes: 0.07 10*3/uL (ref 0.00–0.07)
Basophils Absolute: 0 10*3/uL (ref 0.0–0.1)
Basophils Relative: 0 %
EOS PCT: 1 %
Eosinophils Absolute: 0.1 10*3/uL (ref 0.0–0.5)
Immature Granulocytes: 1 %
Lymphocytes Relative: 21 %
Lymphs Abs: 1 10*3/uL (ref 0.7–4.0)
MONO ABS: 0.6 10*3/uL (ref 0.1–1.0)
Monocytes Relative: 12 %
Neutro Abs: 3.1 10*3/uL (ref 1.7–7.7)
Neutrophils Relative %: 65 %

## 2018-10-29 LAB — CBC
HCT: 30.1 % — ABNORMAL LOW (ref 39.0–52.0)
HEMOGLOBIN: 9.8 g/dL — AB (ref 13.0–17.0)
MCH: 34.1 pg — ABNORMAL HIGH (ref 26.0–34.0)
MCHC: 32.6 g/dL (ref 30.0–36.0)
MCV: 104.9 fL — ABNORMAL HIGH (ref 80.0–100.0)
Platelets: 274 10*3/uL (ref 150–400)
RBC: 2.87 MIL/uL — ABNORMAL LOW (ref 4.22–5.81)
RDW: 14 % (ref 11.5–15.5)
WBC: 4.9 10*3/uL (ref 4.0–10.5)
nRBC: 0 % (ref 0.0–0.2)

## 2018-10-29 LAB — COMPREHENSIVE METABOLIC PANEL
ALT: 27 U/L (ref 0–44)
AST: 34 U/L (ref 15–41)
Albumin: 1.7 g/dL — ABNORMAL LOW (ref 3.5–5.0)
Alkaline Phosphatase: 98 U/L (ref 38–126)
Anion gap: 6 (ref 5–15)
BUN: 20 mg/dL (ref 8–23)
CO2: 24 mmol/L (ref 22–32)
Calcium: 8.1 mg/dL — ABNORMAL LOW (ref 8.9–10.3)
Chloride: 113 mmol/L — ABNORMAL HIGH (ref 98–111)
Creatinine, Ser: 0.9 mg/dL (ref 0.61–1.24)
GFR calc Af Amer: >60 mL/min (ref >60)
GFR calc non Af Amer: >60 mL/min (ref >60)
Glucose, Bld: 112 mg/dL — ABNORMAL HIGH (ref 70–99)
Potassium: 3.9 mmol/L (ref 3.5–5.1)
SODIUM: 143 mmol/L (ref 135–145)
Total Bilirubin: 0.1 mg/dL — ABNORMAL LOW (ref 0.3–1.2)
Total Protein: 5.9 g/dL — ABNORMAL LOW (ref 6.5–8.1)

## 2018-10-29 LAB — GLUCOSE, CAPILLARY
GLUCOSE-CAPILLARY: 105 mg/dL — AB (ref 70–99)
Glucose-Capillary: 103 mg/dL — ABNORMAL HIGH (ref 70–99)
Glucose-Capillary: 106 mg/dL — ABNORMAL HIGH (ref 70–99)
Glucose-Capillary: 102 mg/dL — ABNORMAL HIGH (ref 70–99)

## 2018-10-29 LAB — PREALBUMIN: Prealbumin: 5.7 mg/dL — ABNORMAL LOW (ref 18–38)

## 2018-10-29 LAB — TRIGLYCERIDES: Triglycerides: 117 mg/dL (ref <150)

## 2018-10-29 LAB — MAGNESIUM: Magnesium: 2 mg/dL (ref 1.7–2.4)

## 2018-10-29 LAB — PHOSPHORUS: Phosphorus: 3.1 mg/dL (ref 2.5–4.6)

## 2018-10-29 MED ORDER — SODIUM CHLORIDE 0.9 % IV SOLN
8.0000 mg/h | INTRAVENOUS | Status: AC
  Administered 2018-10-29 – 2018-10-31 (×7): 8 mg/h via INTRAVENOUS
  Filled 2018-10-29 (×8): qty 80

## 2018-10-29 MED ORDER — SODIUM CHLORIDE 0.9 % IV SOLN
INTRAVENOUS | Status: AC
  Administered 2018-10-29 – 2018-10-30 (×2): via INTRAVENOUS

## 2018-10-29 MED ORDER — PANTOPRAZOLE SODIUM 40 MG IV SOLR
40.0000 mg | Freq: Once | INTRAVENOUS | Status: AC
  Administered 2018-10-29: 40 mg via INTRAVENOUS
  Filled 2018-10-29: qty 40

## 2018-10-29 MED ORDER — TRAVASOL 10 % IV SOLN
INTRAVENOUS | Status: AC
  Administered 2018-10-29: 18:00:00 via INTRAVENOUS
  Filled 2018-10-29: qty 858

## 2018-10-29 MED ORDER — LIP MEDEX EX OINT
TOPICAL_OINTMENT | CUTANEOUS | Status: AC
  Administered 2018-10-29: 12:00:00
  Filled 2018-10-29: qty 7

## 2018-10-29 NOTE — Progress Notes (Signed)
[facility] Progress Note     Subjective: CC: no new complaints Patient denies abdominal pain this AM. Denies nausea. Reports he is passing flatus, states he had a BM but is unable to tell me when.   Objective: Vital signs in last 24 hours: Temp:  [97.4 F (36.3 C)-98.8 F (37.1 C)] 98.8 F (37.1 C) (02/17 0005) Pulse Rate:  [84-85] 85 (02/17 0005) Resp:  [16-18] 16 (02/17 0005) BP: (139-159)/(70-71) 159/71 (02/17 0005) SpO2:  [100 %] 100 % (02/17 0005) Last BM Date: 10/25/18  Intake/Output from previous day: 02/16 0701 - 02/17 0700 In: 4717.9 [P.O.:880; I.V.:3537.3; IV Piggyback:300.6] Out: 2900 [Urine:1250; Emesis/NG output:1650] Intake/Output this shift: No intake/output data recorded.  PE: Gen:  Alert, NAD Card:  Regular rate and rhythm Pulm:  Normal effort, clear to auscultation bilaterally Abd: Soft, non-tender, distended, bowel sounds hypoactive, midline scar with small piece suture eroding through skin at superior aspect  Skin: warm and dry, no rashes  Psych: A&Ox3   Lab Results:  Recent Labs    10/27/18 0542 10/29/18 0406  WBC 6.1 4.9  HGB 10.3* 9.8*  HCT 31.6* 30.1*  PLT 267 274   BMET Recent Labs    10/28/18 0436 10/29/18 0406  NA 145 143  K 3.7 3.9  CL 113* 113*  CO2 24 24  GLUCOSE 103* 112*  BUN 23 20  CREATININE 0.99 0.90  CALCIUM 7.9* 8.1*   PT/INR No results for input(s): LABPROT, INR in the last 72 hours. CMP     Component Value Date/Time   NA 143 10/29/2018 0406   K 3.9 10/29/2018 0406   CL 113 (H) 10/29/2018 0406   CO2 24 10/29/2018 0406   GLUCOSE 112 (H) 10/29/2018 0406   BUN 20 10/29/2018 0406   CREATININE 0.90 10/29/2018 0406   CALCIUM 8.1 (L) 10/29/2018 0406   PROT 5.9 (L) 10/29/2018 0406   ALBUMIN 1.7 (L) 10/29/2018 0406   AST 34 10/29/2018 0406   ALT 27 10/29/2018 0406   ALKPHOS 98 10/29/2018 0406   BILITOT 0.1 (L) 10/29/2018 0406   GFRNONAA >60 10/29/2018 0406   GFRAA >60 10/29/2018 0406   Lipase      Component Value Date/Time   LIPASE 30 10/25/2018 1721     Studies/Results: Dg Chest 2 View  Result Date: 10/27/2018 CLINICAL DATA:  Dyspnea on exertion. EXAM: CHEST - 2 VIEW COMPARISON:  One view abdomen 10/26/2018.  Abdominal CT 10/25/2018. FINDINGS: 0920 hours. Right arm PICC extends to the superior cavoatrial junction. Nasogastric tube is in place, tip not visualized on the frontal examination. On the lateral view, the NG tube extends in the proximal stomach. The heart size and mediastinal contours are normal. There is minimal right base atelectasis. The lungs are otherwise clear. There is no pleural effusion or pneumothorax. IMPRESSION: Minimal right basilar atelectasis. No other active cardiopulmonary process. Electronically Signed   By: Carey Bullocks M.D.   On: 10/27/2018 13:20    Anti-infectives: Anti-infectives (From admission, onward)   Start     Dose/Rate Route Frequency Ordered Stop   10/26/18 0400  piperacillin-tazobactam (ZOSYN) IVPB 3.375 g     3.375 g 12.5 mL/hr over 240 Minutes Intravenous Every 8 hours 10/26/18 0245     10/25/18 1930  piperacillin-tazobactam (ZOSYN) IVPB 3.375 g     3.375 g 100 mL/hr over 30 Minutes Intravenous  Once 10/25/18 1928 10/25/18 2021       Assessment/Plan Hx of perforated gastric ulcer - s/p repair in 2003 Hx  of medication non-compliance AKI- improving   Free air with ascites and thickened bowel loops - Benign exam and no signs of sepsis - this is likely subacute - this could be secondary to obstruction vs recurrent ulcer disease - patient is significantly malnourished with temporal wasting - needs TPN and nutritional support prior to any operative intervention - continue PPI drip, bowel rest and IV abx for now - If he continues to remain afebrile/ no wbc/ minimal pain, he may be someone we can reimage w water soluble UGI to assess for ongoing vs sealed leak +/- Ct to assess for drainable abscess, and avoid surgical  intervention in the acute setting.  Severe protein calorie malnutrition - prealbumin 5.7 today, continue TPN for now  FEN: NPO, IVF; protonix gtt restarted VTE: SCDs ID: [email or web address] 2/13>>   Will discuss timing of UGI with attending MD today.   LOS: 4 days    Wells Guiles , [facility] 10/29/2018, 7:48 AM Pager: [phone] Consults: [phone]

## 2018-10-29 NOTE — Plan of Care (Signed)

## 2018-10-29 NOTE — Progress Notes (Signed)
[facility] PROGRESS NOTE   Imre Guerrieri CVK:[id number] DOB: 07/13/1956 DOA: 10/25/2018 PCP: Patient, No Pcp Per   Narrative: 63 year old [place] male perforated ulcer 2003 s/p repair Reflux Chronic low level ethanolism Admitted 2/13 because of abdominal pain over the past year worsening over 2 weeks CT scan showed dilated thickened small bowel with transition point for obstruction with free air NG tube placed General surgery consulted   A & Plan Perforated viscus Per Gen surgery contineTPN and NG tube ? Planning for UGI water soluble in 48-72 horus Continue Zosyn as well as Protonix GTT 8/hr Continue saline 80 today and then 65 from tomorrow as per TPN protocol Presumed acute kidney injury Previous BUN/creatinine low on admission 59/1.3-on admit--resolving Severe malnutrition BMI 15 Getting TPN Continue insulin coverage of the same--sugars 103-106  DVT Lovenox code Status: Full code communication: no family today at bedside-updated son on 2/15  Disposition Plan: Inpatient at this time   Mahala Menghini, MD  [facility] Via [employer] app OR [email or web address] 7PM-7AM contact night coverage as above 10/29/2018, 11:41 AM  LOS: 4 days   Consultants:  General surgery  Procedures:  CT scan  Antimicrobials:  Zosyn  Interval history/Subjective:  No new changes  Objective:  Vitals:  Vitals:   10/29/18 0005 10/29/18 1130  BP: (!) 159/71 (!) 148/75  Pulse: 85 91  Resp: 16 16  Temp: 98.8 F (37.1 C) 98 F (36.7 C)  SpO2: 100% 98%    Exam:  EOMI NCAT no icterus no pallor very asthenic Chest is clear no added sound Abdomen is soft scaphoid no rebound no guarding area in epigastrium is dark but non tender Neurologically intact looking very wasted however   I have personally reviewed the following:  DATA   Labs:  BUN/creatinine down from 59/1.3--->23/0.99-->20/0.9  Mag Phos normalized  Pre-albumin 5.7  Imaging studies:  CXR 2/15 showed right  basilar atelectasis  Medical tests:  No  Test discussed with performing physician:  No  Decision to obtain old records:  No  Review and summation of old records:  Yes  Scheduled Meds: . insulin aspart  0-9 Units Subcutaneous Q6H  . sodium chloride flush  10-40 mL Intracatheter Q12H   Continuous Infusions: . sodium chloride 80 mL/hr at 10/29/18 0450  . sodium chloride    . pantoprozole (PROTONIX) infusion 8 mg/hr (10/29/18 0926)  . piperacillin-tazobactam (ZOSYN)  IV 3.375 g (10/29/18 0548)  . TPN ADULT (ION) 50 mL/hr at 10/28/18 1725  . TPN ADULT (ION)      Principal Problem:   Pneumoperitoneum Active Problems:   AKI (acute kidney injury) (HCC)   Cachexia (HCC)   PUD (peptic ulcer disease)   SBO (small bowel obstruction) (HCC)   Moderate alcohol consumption   GERD (gastroesophageal reflux disease)   Protein-calorie malnutrition, severe (HCC)   LOS: 4 days

## 2018-10-29 NOTE — Progress Notes (Signed)
PHARMACY - ADULT TOTAL PARENTERAL NUTRITION CONSULT NOTE   Pharmacy Consult for TPN Indication: bowel rest, severe malnutrition PTA  Patient Measurements: Height: 6' 2"  (188 cm) Weight: 120 lb 13 oz (54.8 kg) IBW/kg (Calculated) : 82.2 TPN AdjBW (KG): 54 Body mass index is 15.51 kg/m. Usual Weight: unknown as patient is poor historian, but reports significant weight loss over the past year  HPI: 22 yoM with PMH PUD with perforated gastric ulcer s/p repair in 2003, GERD presents with worsening chronic abdominal pain and associated weight loss. Cachectic appearing. CT reveals ascites with concern for SBO vs recurrent ulcer disease. Pt is NPO. Consulted by surgery for TPN management. Pt will require nutritional support prior to any surgical intervention.  Central access: Double lumen PICC placed 2/14 TPN start date: 2/14  Significant events:   ASSESSMENT                                                                                               Current Nutrition: NPO except for ice chips  IVF: NS at 80 mL/hr  Today, 10/29/2018:  Glucose (CBG goal 100-150) - CBGs normal prior to TPN. BG 90-105 range over past 24 hours. No SSI required.  Electrolytes - WNL except Cl slightly elevated. K WNL but below goal of 4 with concern for SBO.  Na (143), K (3.9), Cl (113), Corrected calcium (9.9), Phos (3.1), Mg (2)  Renal - Mild AKI on admission - resolved. SCr now WNL  Bicarb normal, BUN trended down to WNL  LFTs, Alk Phos - WNL. Albumin (1.7) low  TGs - WNL (120, 2/15), (117, 2/17)   Prealbumin - <5 (low, chronic malnutrition)  NUTRITIONAL GOALS                                                                                 RD recs (per note on 2/14): Kcal: 1900 - 2100 grams Protein: 95 - 115 g Fluid: >/= 1.9 L/day  Custom TPN at goal rate of 80 mL/hr will provide: 2070 kcal, 105 g protein, and 326 g dextrose, meeting 100% of patient needs    PLAN                                                                                                                 At 1800 today:  Advance  TPN to 65 mL/hr as electrolytes WNL. Given risk for refeeding syndrome, will advance slowly. Plan to advance as tolerated to the goal rate  TPN to contain standard MVI and trace elements  TPN to contain standard electrolyte concentrations with the exception of slightly increased phosphorus and potassium. Cl:Ac 1:2.   Reduce IVF to 65 mL/hr  Continue sensitive SSI with q6 hr CBG checks  BMP, Magnesium, phosphorus with AM labs   TPN lab panels on Mondays & Thursdays   Royetta Asal, PharmD, [place] Pager [phone] 10/29/2018 11:20 AM

## 2018-10-30 LAB — BASIC METABOLIC PANEL
Anion gap: 7 (ref 5–15)
BUN: 18 mg/dL (ref 8–23)
CO2: 24 mmol/L (ref 22–32)
Calcium: 7.9 mg/dL — ABNORMAL LOW (ref 8.9–10.3)
Chloride: 110 mmol/L (ref 98–111)
Creatinine, Ser: 0.83 mg/dL (ref 0.61–1.24)
GFR calc Af Amer: >60 mL/min (ref >60)
GFR calc non Af Amer: >60 mL/min (ref >60)
Glucose, Bld: 107 mg/dL — ABNORMAL HIGH (ref 70–99)
Potassium: 3.7 mmol/L (ref 3.5–5.1)
Sodium: 141 mmol/L (ref 135–145)

## 2018-10-30 LAB — GLUCOSE, CAPILLARY
GLUCOSE-CAPILLARY: 105 mg/dL — AB (ref 70–99)
Glucose-Capillary: 108 mg/dL — ABNORMAL HIGH (ref 70–99)
Glucose-Capillary: 108 mg/dL — ABNORMAL HIGH (ref 70–99)
Glucose-Capillary: 118 mg/dL — ABNORMAL HIGH (ref 70–99)
Glucose-Capillary: 119 mg/dL — ABNORMAL HIGH (ref 70–99)
Glucose-Capillary: 94 mg/dL (ref 70–99)

## 2018-10-30 LAB — MAGNESIUM: MAGNESIUM: 1.8 mg/dL (ref 1.7–2.4)

## 2018-10-30 LAB — PHOSPHORUS: Phosphorus: 3.4 mg/dL (ref 2.5–4.6)

## 2018-10-30 MED ORDER — MAGNESIUM SULFATE IN D5W 1-5 GM/100ML-% IV SOLN
1.0000 g | Freq: Once | INTRAVENOUS | Status: AC
  Administered 2018-10-30: 1 g via INTRAVENOUS
  Filled 2018-10-30: qty 100

## 2018-10-30 MED ORDER — ENOXAPARIN SODIUM 40 MG/0.4ML ~~LOC~~ SOLN: 40.0000 mg | SUBCUTANEOUS | Status: DC

## 2018-10-30 MED ORDER — POTASSIUM CHLORIDE 10 MEQ/100ML IV SOLN
10.0000 meq | INTRAVENOUS | Status: AC
  Administered 2018-10-30 (×2): 10 meq via INTRAVENOUS
  Filled 2018-10-30: qty 100

## 2018-10-30 MED ORDER — SODIUM CHLORIDE 0.9 % IV SOLN
INTRAVENOUS | Status: DC
  Administered 2018-10-30 – 2018-11-24 (×25): via INTRAVENOUS

## 2018-10-30 MED ORDER — TRAVASOL 10 % IV SOLN
INTRAVENOUS | Status: AC
  Administered 2018-10-30: 18:00:00 via INTRAVENOUS
  Filled 2018-10-30: qty 1056

## 2018-10-30 MED ORDER — HEPARIN SODIUM (PORCINE) 5000 UNIT/ML IJ SOLN
5000.0000 [IU] | Freq: Three times a day (TID) | INTRAMUSCULAR | Status: DC
  Administered 2018-10-30 – 2018-11-08 (×28): 5000 [IU] via SUBCUTANEOUS
  Filled 2018-10-30 (×28): qty 1

## 2018-10-30 NOTE — Progress Notes (Signed)
   Subjective/Chief Complaint: No complaints Passing flatus Large amount of NG output but taking in a lot of ice   Objective: Vital signs in last 24 hours: Temp:  [98 F (36.7 C)-98.8 F (37.1 C)] 98.8 F (37.1 C) (02/18 0649) Pulse Rate:  [86-94] 94 (02/18 0649) Resp:  [16-24] 20 (02/18 0649) BP: (140-148)/(69-75) 140/69 (02/18 0649) SpO2:  [97 %-100 %] 97 % (02/18 0649) Last BM Date: 10/29/18  Intake/Output from previous day: 02/17 0701 - 02/18 0700 In: 2958.9 [I.V.:2859.5; IV Piggyback:99.4] Out: 3175 [Urine:1500; Emesis/NG output:1675] Intake/Output this shift: No intake/output data recorded.  Exam: Awake and alert Looks comfortable Abdomen soft, NT  Lab Results:  Recent Labs    10/29/18 0406  WBC 4.9  HGB 9.8*  HCT 30.1*  PLT 274   BMET Recent Labs    10/29/18 0406 10/30/18 0500  NA 143 141  K 3.9 3.7  CL 113* 110  CO2 24 24  GLUCOSE 112* 107*  BUN 20 18  CREATININE 0.90 0.83  CALCIUM 8.1* 7.9*   PT/INR No results for input(s): LABPROT, INR in the last 72 hours. ABG No results for input(s): PHART, HCO3 in the last 72 hours.  Invalid input(s): PCO2, PO2  Studies/Results: No results found.  Anti-infectives: Anti-infectives (From admission, onward)   Start     Dose/Rate Route Frequency Ordered Stop   10/26/18 0400  piperacillin-tazobactam (ZOSYN) IVPB 3.375 g     3.375 g 12.5 mL/hr over 240 Minutes Intravenous Every 8 hours 10/26/18 0245     10/25/18 1930  piperacillin-tazobactam (ZOSYN) IVPB 3.375 g     3.375 g 100 mL/hr over 30 Minutes Intravenous  Once 10/25/18 1928 10/25/18 2021      Assessment/Plan:  Patient with free air, suspected perforation from possible gastric/ulcer source  Will check an upper GI series tomorrow to look for a leak or obstruction  LOS: 5 days    Abigail Miyamoto 10/30/2018

## 2018-10-30 NOTE — Progress Notes (Signed)
[facility] PROGRESS NOTE   Chayson Grall OVA:[id number] DOB: [birth year] DOA: 10/25/2018 PCP: Patient, No Pcp Per   Narrative: 63 year old [place] male perforated ulcer 2003 s/p repair Reflux Chronic low level ethanolism Admitted 2/13 because of abdominal pain over the past year worsening over 2 weeks CT scan showed dilated thickened small bowel with transition point for obstruction with free air NG tube placed General surgery consulted   A & Plan Perforated viscus Per Gen surgery contine TPN and NG tube For UGI 2/19 Continue Zosyn as well as Protonix GTT 8/hr Continue saline 65 cc/h per TPN protocol Presumed acute kidney injury Previous BUN/creatinine low on admission 59/1.3-on admit--resolving periodic labs Severe malnutrition BMI 15 Getting TPN Continue insulin coverage of the same--sugars  94-118  DVT Lovenox code Status: Full code communication: updated sone on 2/17 Will request that general surgery weigh in post procedure to explain further planning to patient and family  Disposition Plan: Inpatient at this time   Mahala Menghini, MD  [facility] Via amion app OR [email or web address] 7PM-7AM contact night coverage as above 10/30/2018, 9:21 AM  LOS: 5 days   Consultants:  General surgery  Procedures:  CT scan  Antimicrobials:  Zosyn  Interval history/Subjective:  Eating Ice tells me had a stool 2/17 watery NG OP increased 2/2 ice chips  Objective:  Vitals:  Vitals:   10/29/18 2153 10/30/18 0649  BP: (!) 146/69 140/69  Pulse: 86 94  Resp: (!) 24 20  Temp: 98 F (36.7 C) 98.8 F (37.1 C)  SpO2: 100% 97%    Exam:  coherent asthenic pleasant Chest clear and benign Abdomen is soft scaphoid  Neurologically intact  Overall very malnoursihed   I have personally reviewed the following:  DATA   Labs:  BUN/creatinine down from 59/1.3--->23/0.99-->20/0.9-->18/0.83  Mag Phos normalized  Pre-albumin 5.7  NG op ~ 1.7 liters, he is net +  7.2 liters  Imaging studies:  CXR 2/15 showed right basilar atelectasis  Medical tests:  No  Test discussed with performing physician:  No  Decision to obtain old records:  No  Review and summation of old records:  Yes  Scheduled Meds: . insulin aspart  0-9 Units Subcutaneous Q6H  . sodium chloride flush  10-40 mL Intracatheter Q12H   Continuous Infusions: . sodium chloride 65 mL/hr at 10/30/18 0738  . magnesium sulfate 1 - 4 g bolus IVPB    . pantoprozole (PROTONIX) infusion 8 mg/hr (10/30/18 0420)  . piperacillin-tazobactam (ZOSYN)  IV 3.375 g (10/30/18 0539)  . potassium chloride    . TPN ADULT (ION) 65 mL/hr at 10/29/18 1752  . TPN ADULT (ION)      Principal Problem:   Pneumoperitoneum Active Problems:   AKI (acute kidney injury) (HCC)   Cachexia (HCC)   PUD (peptic ulcer disease)   SBO (small bowel obstruction) (HCC)   Moderate alcohol consumption   GERD (gastroesophageal reflux disease)   Protein-calorie malnutrition, severe (HCC)   LOS: 5 days

## 2018-10-30 NOTE — Progress Notes (Signed)
ANTICOAGULATION CONSULT NOTE - Initial Consult  Pharmacy Consult for SQ heparin Indication: VTE prophylaxis  No Known Allergies  Patient Measurements: Height: 6\' 2"  (188 cm) Weight: 120 lb 13 oz (54.8 kg) IBW/kg (Calculated) : 82.2   Vital Signs: Temp: 98.2 F (36.8 C) (02/18 1509) Temp Source: Oral (02/18 1509) BP: 145/74 (02/18 1509) Pulse Rate: 92 (02/18 1509)  Labs: Recent Labs    10/28/18 0436 10/29/18 0406 10/30/18 0500  HGB  --  9.8*  --   HCT  --  30.1*  --   PLT  --  274  --   CREATININE 0.99 0.90 0.83    Estimated Creatinine Clearance: 69.7 mL/min (by C-G formula based on SCr of 0.83 mg/dL).   Medical History: Past Medical History:  Diagnosis Date  . GERD (gastroesophageal reflux disease)   . Gonorrhea 2009  . Hematuria 2009  . Peptic ulcer 2003   ?ulcer surgery in [place]?  Marland Kitchen UTI (urinary tract infection) 2009    Medications:  Scheduled:  . enoxaparin (LOVENOX) injection  40 mg Subcutaneous Q24H  . insulin aspart  0-9 Units Subcutaneous Q6H  . sodium chloride flush  10-40 mL Intracatheter Q12H    Assessment: 63 yo man with suspected perforation from possible gastric/ulcer source. Pharmacy consulted to dose SQ heparin for VTE px.   Plan:  Heparin 5000 units sq q8h Pharmacy to sign off  Herby Abraham, Pharm.D [phone] 10/30/2018 3:19 PM

## 2018-10-30 NOTE — Progress Notes (Signed)
PHARMACY - ADULT TOTAL PARENTERAL NUTRITION CONSULT NOTE   Pharmacy Consult for TPN Indication: bowel rest, severe malnutrition PTA  Patient Measurements: Height: 6' 2" (188 cm) Weight: 120 lb 13 oz (54.8 kg) IBW/kg (Calculated) : 82.2 TPN AdjBW (KG): 54 Body mass index is 15.51 kg/m. Usual Weight: unknown as patient is poor historian, but reports significant weight loss over the past year  HPI: 62 yoM with PMH PUD with perforated gastric ulcer s/p repair in 2003, GERD presents with worsening chronic abdominal pain and associated weight loss. Cachectic appearing. CT reveals ascites with concern for SBO vs recurrent ulcer disease. Pt is NPO. Consulted by surgery for TPN management. Pt will require nutritional support prior to any surgical intervention.  Central access: Double lumen PICC placed 2/14 TPN start date: 2/14  Significant events:   ASSESSMENT                                                                                               Current Nutrition: NPO except for ice chips  IVF: NS at 65 mL/hr  Today, 10/30/2018:  Glucose (CBG goal 100-150) - CBGs normal prior to TPN. BG 94-118 range over past 24 hours. No SSI required.  Electrolytes - WNL . K WNL but below goal of 4 with concern for SBO, Mg < 2.  Renal - Mild AKI on admission - resolved. SCr now WNL  Bicarb normal  LFTs, Alk Phos - WNL. Albumin (1.7) low  TGs - WNL (120, 2/15), (117, 2/17)   Prealbumin - <5 (low, chronic malnutrition)  NUTRITIONAL GOALS                                                                                 RD recs (per note on 2/14): Kcal: 1900 - 2100 grams Protein: 95 - 115 g Fluid: >/= 1.9 L/day  Custom TPN at goal rate of 80 mL/hr will provide: 2070 kcal, 105 g protein, and 326 g dextrose, meeting 100% of patient needs    PLAN      Mg sulfate 1 g iv once  KCl 10 meq IV x 2 runs                             At 1800 today:  Advance TPN to goal rate of 80 mL/hr  providing 100 % of patient needs with 2070 kcal and 105 g protein  Continue with standard electrolytes but increase Mg slightly to 7.5 meq/L. Cl:Ac 1:2.   Reduce IVF to 50 mL/hr  Continue sensitive SSI with q6 hr CBG checks  BMP, Magnesium, phosphorus with AM labs   TPN lab panels on Mondays & Thursdays   Scott Christy, PharmD Clinical Pharmacist Pager # [phone]  10/30/2018 8:36 AM    

## 2018-10-31 ENCOUNTER — Inpatient Hospital Stay (HOSPITAL_COMMUNITY): Payer: Self-pay

## 2018-10-31 DIAGNOSIS — N179 Acute kidney failure, unspecified: Secondary | ICD-10-CM

## 2018-10-31 DIAGNOSIS — K56609 Unspecified intestinal obstruction, unspecified as to partial versus complete obstruction: Secondary | ICD-10-CM

## 2018-10-31 DIAGNOSIS — E43 Unspecified severe protein-calorie malnutrition: Secondary | ICD-10-CM

## 2018-10-31 DIAGNOSIS — R64 Cachexia: Secondary | ICD-10-CM

## 2018-10-31 DIAGNOSIS — Z789 Other specified health status: Secondary | ICD-10-CM

## 2018-10-31 DIAGNOSIS — K219 Gastro-esophageal reflux disease without esophagitis: Secondary | ICD-10-CM

## 2018-10-31 LAB — BASIC METABOLIC PANEL
Anion gap: 5 (ref 5–15)
BUN: 17 mg/dL (ref 8–23)
CO2: 26 mmol/L (ref 22–32)
Calcium: 7.8 mg/dL — ABNORMAL LOW (ref 8.9–10.3)
Chloride: 107 mmol/L (ref 98–111)
Creatinine, Ser: 0.72 mg/dL (ref 0.61–1.24)
GFR calc Af Amer: >60 mL/min (ref >60)
GFR calc non Af Amer: >60 mL/min (ref >60)
Glucose, Bld: 107 mg/dL — ABNORMAL HIGH (ref 70–99)
Potassium: 4.1 mmol/L (ref 3.5–5.1)
Sodium: 138 mmol/L (ref 135–145)

## 2018-10-31 LAB — PHOSPHORUS: Phosphorus: 3.4 mg/dL (ref 2.5–4.6)

## 2018-10-31 LAB — GLUCOSE, CAPILLARY
GLUCOSE-CAPILLARY: 113 mg/dL — AB (ref 70–99)
Glucose-Capillary: 106 mg/dL — ABNORMAL HIGH (ref 70–99)
Glucose-Capillary: 114 mg/dL — ABNORMAL HIGH (ref 70–99)
Glucose-Capillary: 122 mg/dL — ABNORMAL HIGH (ref 70–99)

## 2018-10-31 LAB — MAGNESIUM: Magnesium: 1.9 mg/dL (ref 1.7–2.4)

## 2018-10-31 MED ORDER — IOHEXOL 300 MG/ML  SOLN
150.0000 mL | Freq: Once | INTRAMUSCULAR | Status: AC | PRN
  Administered 2018-10-31: 150 mL via ORAL

## 2018-10-31 MED ORDER — TRAVASOL 10 % IV SOLN
INTRAVENOUS | Status: AC
  Administered 2018-10-31: 18:00:00 via INTRAVENOUS
  Filled 2018-10-31: qty 1056

## 2018-10-31 MED ORDER — MAGNESIUM SULFATE IN D5W 1-5 GM/100ML-% IV SOLN
1.0000 g | Freq: Once | INTRAVENOUS | Status: AC
  Administered 2018-10-31: 1 g via INTRAVENOUS
  Filled 2018-10-31: qty 100

## 2018-10-31 NOTE — Progress Notes (Signed)
PROGRESS NOTE  Vincent PellantCharles Park ZOX:[id number] DOB: 08-Feb-1956 DOA: 10/25/2018 PCP: Patient, No Pcp Per   LOS: 6 days   Brief Narrative / Interim history: 63 year old [place] male with history of perforated ulcer in 2003 status post repair, reflux, severe protein calorie malnutrition admitted 2/13 with abdominal pain for 2 weeks.  CT abdomen showed dilated thickened small bowel with transition point for obstruction with free air concerning for pneumoperitoneum.  NG tube placed.  General surgery consulted.  Subjective: No major events overnight of this morning.  No complaints this morning.  G-tube in place.  With significant NG output.  Passing flatus but no bowel movement.  Assessment & Plan: Principal Problem:   Pneumoperitoneum Active Problems:   AKI (acute kidney injury) (HCC)   Cachexia (HCC)   PUD (peptic ulcer disease)   SBO (small bowel obstruction) (HCC)   Moderate alcohol consumption   GERD (gastroesophageal reflux disease)   Protein-calorie malnutrition, severe (HCC)  SBO/pneumoperitoneum: Noted on CT abdomen. -General surgery managing conservatively for now -UGI on 2/19 showed large ulcer without definite leak -Continue TPN  AKI: Resolved. -Avoid nephrotoxic meds.  Severe protein calorie malnutrition: BMI 15. ?Alcohol abuse.  -Needs work-up for malignancy. -Appreciate guidance by nutritionist -Continue TPN -Monitor electrolytes for refeeding syndrome  Scheduled Meds: . heparin injection (subcutaneous)  5,000 Units Subcutaneous Q8H  . insulin aspart  0-9 Units Subcutaneous Q6H  . sodium chloride flush  10-40 mL Intracatheter Q12H   Continuous Infusions: . sodium chloride 50 mL/hr at 10/30/18 2206  . pantoprozole (PROTONIX) infusion 8 mg/hr (10/31/18 1311)  . piperacillin-tazobactam (ZOSYN)  IV 3.375 g (10/31/18 1340)  . TPN ADULT (ION) 80 mL/hr at 10/30/18 1735  . TPN ADULT (ION)     PRN Meds:.sodium chloride flush  DVT prophylaxis: Subcu Lovenox Code  Status: Full code Family Communication: None at bedside Disposition Plan: Remains inpatient  Consultants:   None at surgery  Procedures:   NG tube  Antimicrobials:  None  Objective: Vitals:   10/30/18 1509 10/30/18 2151 10/31/18 0509 10/31/18 1405  BP: (!) 145/74 (!) 142/71 123/61 132/76  Pulse: 92 92 93 92  Resp: 20 18 18 15   Temp: 98.2 F (36.8 C) 99.2 F (37.3 C) 98.7 F (37.1 C) 98.9 F (37.2 C)  TempSrc: Oral Oral Oral Oral  SpO2: 100% 99% 99% 99%  Weight:      Height:        Intake/Output Summary (Last 24 hours) at 10/31/2018 1450 Last data filed at 10/31/2018 1405 Gross per 24 hour  Intake 2244.35 ml  Output 3975 ml  Net -1730.65 ml   Filed Weights   10/25/18 1449 10/25/18 2218 10/26/18 1258  Weight: 48 kg 54 kg 54.8 kg    Examination:  GENERAL: Significant muscle wasting EYES - vision grossly intact. Sclera anicteric.  NOSE- no gross deformity or drainage MOUTH - no oral lesions noted THROAT- no swelling or erythema LUNGS:  No IWOB. Good air movement. CTAB.  HEART: RRR. Heart sounds normal. ABD: Bowel sounds present. Soft. Non tender.  MSK/EXT: Moves extremities. No obvious deformity.  Significant muscle mass wasting. SKIN: no apparent skin lesion.  NEURO: Awake, alert and oriented appropriately.  No gross deficit.  PSYCH: Calm. Normal affect.   Data Reviewed: I have independently reviewed following labs and imaging studies   CBC: Recent Labs  Lab 10/25/18 1721 10/26/18 0500 10/27/18 0542 10/29/18 0406  WBC 10.5 8.5 6.1 4.9  NEUTROABS 9.1*  --  4.8 3.1  HGB 12.5* 11.8* 10.3*  9.8*  HCT 38.0* 36.4* 31.6* 30.1*  MCV 101.6* 101.7* 105.0* 104.9*  PLT 276 319 267 274   Basic Metabolic Panel: Recent Labs  Lab 10/27/18 0542 10/28/18 0436 10/29/18 0406 10/30/18 0500 10/31/18 0443  NA 142 145 143 141 138  K 3.7 3.7 3.9 3.7 4.1  CL 110 113* 113* 110 107  CO2 25 24 24 24 26   GLUCOSE 108* 103* 112* 107* 107*  BUN 33* 23 20 18 17     CREATININE 1.00 0.99 0.90 0.83 0.72  CALCIUM 8.0* 7.9* 8.1* 7.9* 7.8*  MG 2.2 2.1 2.0 1.8 1.9  PHOS 3.4 3.0 3.1 3.4 3.4   GFR: Estimated Creatinine Clearance: 72.3 mL/min (by C-G formula based on SCr of 0.72 mg/dL). Liver Function Tests: Recent Labs  Lab 10/25/18 1721 10/27/18 0542 10/29/18 0406  AST 45* 39 34  ALT 34 29 27  ALKPHOS 134* 97 98  BILITOT 0.8 0.5 0.1*  PROT 7.0 5.8* 5.9*  ALBUMIN 2.2* 1.7* 1.7*   Recent Labs  Lab 10/25/18 1721  LIPASE 30   No results for input(s): AMMONIA in the last 168 hours. Coagulation Profile: Recent Labs  Lab 10/26/18 0500  INR 1.13   Cardiac Enzymes: No results for input(s): CKTOTAL, CKMB, CKMBINDEX, TROPONINI in the last 168 hours. BNP (last 3 results) No results for input(s): PROBNP in the last 8760 hours. HbA1C: No results for input(s): HGBA1C in the last 72 hours. CBG: Recent Labs  Lab 10/30/18 1753 10/30/18 2001 10/30/18 2340 10/31/18 0606 10/31/18 1233  GLUCAP 105* 108* 108* 114* 122*   Lipid Profile: Recent Labs    10/29/18 0405  TRIG 117   Thyroid Function Tests: No results for input(s): TSH, T4TOTAL, FREET4, T3FREE, THYROIDAB in the last 72 hours. Anemia Panel: No results for input(s): VITAMINB12, FOLATE, FERRITIN, TIBC, IRON, RETICCTPCT in the last 72 hours. Urine analysis:    Component Value Date/Time   COLORURINE AMBER (A) 10/25/2018 1816   APPEARANCEUR CLEAR 10/25/2018 1816   LABSPEC 1.020 10/25/2018 1816   PHURINE 5.5 10/25/2018 1816   GLUCOSEU NEGATIVE 10/25/2018 1816   HGBUR NEGATIVE 10/25/2018 1816   BILIRUBINUR NEGATIVE 10/25/2018 1816   KETONESUR NEGATIVE 10/25/2018 1816   PROTEINUR NEGATIVE 10/25/2018 1816   NITRITE NEGATIVE 10/25/2018 1816   LEUKOCYTESUR NEGATIVE 10/25/2018 1816   Sepsis Labs: Invalid input(s): PROCALCITONIN, LACTICIDVEN  No results found for this or any previous visit (from the past 240 hour(s)).    Radiology Studies: Dg Ugi W Single Cm (sol Or Thin  Ba)  Result Date: 10/31/2018 CLINICAL DATA:  63 year old male inpatient with remote history of repair of perforated gastric ulcer, presents with pneumoperitoneum and clinical concern for perforated gastric ulcer or gastric outlet obstruction. EXAM: WATER SOLUBLE UPPER GI SERIES TECHNIQUE: Single-column upper GI series was performed using water soluble contrast. CONTRAST:  Water-soluble oral contrast. COMPARISON:  10/26/2018 abdominal radiographs and 10/25/2018 CT abdomen/pelvis. FLUOROSCOPY TIME:  Fluoroscopy Time:  2 minutes 6 seconds Radiation Exposure Index (if provided by the fluoroscopic device): 33.9 mGy Number of Acquired Spot Images: 6 FINDINGS: Enteric tube terminates in proximal stomach with side port at the esophagogastric junction. Free intraperitoneal air is noted in the left upper quadrant on the scout radiograph. Mildly dilated small bowel loops are noted in the central abdomen, similar to decreased from prior abdominal radiograph. Grossly normal esophagus on limited views. Oral contrast transits the stomach into the duodenum and proximal small bowel without delay. An ulcer is identified in the anterior proximal body of the stomach.  No contrast leak from the stomach into the peritoneal cavity was elicited. There is irregular wall thickening in the anterior proximal gastric wall surrounding the ulcer site. There is narrowing of the body of the stomach. Incidentally noted small bowel malrotation, with the duodenal jejunal junction to the right of the spine. Proximal small bowel loops are mildly dilated. IMPRESSION: 1. Gastric ulcer identified in the anterior proximal body of the stomach, with surrounding irregular gastric wall thickening, cannot exclude a malignant ulcer. Persistent free intraperitoneal air in the left upper quadrant surrounding the ulcer site. No water-soluble contrast leak from the stomach into the peritoneal cavity detected. 2. No evidence of gastric outlet obstruction. Nonspecific  narrowing of the body of the stomach. 3. Incidental small bowel malrotation. Proximal small bowel dilatation is unchanged from recent CT and abdominal radiograph, probably due to ileus due to peritonitis. These results were called by telephone at the time of interpretation on 10/31/2018 at 11:41 am to Dr. Abigail Miyamoto , who verbally acknowledged these results. Electronically Signed   By: Delbert Phenix M.D.   On: 10/31/2018 12:00     T. [facility] Pager [phone]  If 7PM-7AM, please contact night-coverage [email or web address] Password [email or web address] 10/31/2018, 2:50 PM

## 2018-10-31 NOTE — Progress Notes (Signed)
PHARMACY - ADULT TOTAL PARENTERAL NUTRITION CONSULT NOTE   Pharmacy Consult for TPN Indication: bowel rest, severe malnutrition PTA  Patient Measurements: Height: _0  (188 cm) Weight: 120 lb 13 oz (54.8 kg) IBW/kg (Calculated) : 82.2 TPN AdjBW (KG): 54 Body mass index is 15.51 kg/m. Usual Weight: unknown as patient is poor historian, but reports significant weight loss over the past year  HPI: 35 yoM with PMH PUD with perforated gastric ulcer s/p repair in 2003, GERD presents with worsening chronic abdominal pain and associated weight loss. Cachectic appearing. CT reveals ascites with concern for SBO vs recurrent ulcer disease. Pt is NPO. Consulted by surgery for TPN management. Pt will require nutritional support prior to any surgical intervention.  Central access: Double lumen PICC placed 2/14 TPN start date: 2/14  Significant events:   ASSESSMENT                                                                                               Current Nutrition: NPO except for ice chips  IVF: NS at 50 mL/hr  Today, 10/31/2018:  Glucose (CBG goal 100-150) - CBGs normal prior to TPN. BG 108-114 range over past 24 hours. No SSI required.  Electrolytes - WNL . K 4.1 goal of 4 with concern for SBO, Mg < 2.  Renal - Mild AKI on admission - resolved. SCr now WNL  Bicarb normal  LFTs, Alk Phos - WNL. Albumin (1.7) low  TGs - WNL (120, 2/15), (117, 2/17)   Prealbumin - <5 (low, chronic malnutrition)  NUTRITIONAL GOALS                                                                                 RD recs (per note on 2/14): Kcal: 1900 - 2100 grams Protein: 95 - 115 g Fluid: >/= 1.9 L/day  Custom TPN at goal rate of 80 mL/hr will provide: 2070 kcal, 105 g protein, and 326 g dextrose, meeting 100% of patient needs    PLAN    Mg sulfate 1 g iv once                         At 1800 today:  Continue TPN at goal rate of 80 mL/hr providing 100 % of patient needs with 2070  kcal and 105 g protein  Continue with standard electrolytes with increase Mg slightly to 7.5 meq/L. Cl:Ac 1:2.   continue IVF to 50 mL/hr  Continue sensitive SSI with q6 hr CBG checks  TPN lab panels on Mondays & Thursdays   Dolly Rias RPh 10/31/2018, 8:07 AM Pager [phone]

## 2018-10-31 NOTE — Progress Notes (Signed)
Patient ID: Vincent Park, male   DOB: 1956-06-24, 63 y.o.   MRN: [id number]   UGI shows large ulcer without definite leak.  Free air is still seen.  Clinically, he does not have abdominal pain or peritonitis. I want to leave the NG in several more days. Will check tumor markers.  He may need an endoscopy to r/o cancer sooner than later.  I discussed the findings with the patient and his son by phone

## 2018-11-01 ENCOUNTER — Inpatient Hospital Stay (HOSPITAL_COMMUNITY): Payer: Self-pay

## 2018-11-01 DIAGNOSIS — K279 Peptic ulcer, site unspecified, unspecified as acute or chronic, without hemorrhage or perforation: Secondary | ICD-10-CM

## 2018-11-01 LAB — PHOSPHORUS: Phosphorus: 3.4 mg/dL (ref 2.5–4.6)

## 2018-11-01 LAB — CANCER ANTIGEN 19-9: CA 19-9: 202 U/mL — ABNORMAL HIGH (ref 0–35)

## 2018-11-01 LAB — COMPREHENSIVE METABOLIC PANEL
ALT: 24 U/L (ref 0–44)
AST: 29 U/L (ref 15–41)
Albumin: 1.7 g/dL — ABNORMAL LOW (ref 3.5–5.0)
Alkaline Phosphatase: 124 U/L (ref 38–126)
Anion gap: 4 — ABNORMAL LOW (ref 5–15)
BUN: 18 mg/dL (ref 8–23)
CO2: 27 mmol/L (ref 22–32)
Calcium: 7.8 mg/dL — ABNORMAL LOW (ref 8.9–10.3)
Chloride: 104 mmol/L (ref 98–111)
Creatinine, Ser: 0.74 mg/dL (ref 0.61–1.24)
GFR calc Af Amer: >60 mL/min (ref >60)
GFR calc non Af Amer: >60 mL/min (ref >60)
GLUCOSE: 110 mg/dL — AB (ref 70–99)
Potassium: 4.2 mmol/L (ref 3.5–5.1)
Sodium: 135 mmol/L (ref 135–145)
Total Bilirubin: 0.8 mg/dL (ref 0.3–1.2)
Total Protein: 6.5 g/dL (ref 6.5–8.1)

## 2018-11-01 LAB — GLUCOSE, CAPILLARY
GLUCOSE-CAPILLARY: 109 mg/dL — AB (ref 70–99)
GLUCOSE-CAPILLARY: 124 mg/dL — AB (ref 70–99)
Glucose-Capillary: 105 mg/dL — ABNORMAL HIGH (ref 70–99)
Glucose-Capillary: 129 mg/dL — ABNORMAL HIGH (ref 70–99)

## 2018-11-01 LAB — CEA: CEA: 2.9 ng/mL (ref 0.0–4.7)

## 2018-11-01 LAB — MAGNESIUM: Magnesium: 2 mg/dL (ref 1.7–2.4)

## 2018-11-01 LAB — CA 125: Cancer Antigen (CA) 125: 54.4 U/mL

## 2018-11-01 MED ORDER — PANTOPRAZOLE SODIUM 40 MG IV SOLR
40.0000 mg | Freq: Two times a day (BID) | INTRAVENOUS | Status: DC
  Administered 2018-11-01 – 2018-11-20 (×38): 40 mg via INTRAVENOUS
  Filled 2018-11-01 (×38): qty 40

## 2018-11-01 MED ORDER — TRAVASOL 10 % IV SOLN
INTRAVENOUS | Status: AC
  Administered 2018-11-01: 17:00:00 via INTRAVENOUS
  Filled 2018-11-01: qty 1056

## 2018-11-01 NOTE — Progress Notes (Signed)
Nutrition Follow-up  DOCUMENTATION CODES:   Underweight, Severe malnutrition in context of chronic illness  INTERVENTION:    TPN per pharmacy  Check daily weights   Monitor for diet advancement/toleration  NUTRITION DIAGNOSIS:   Severe Malnutrition related to chronic illness(PUD with surgery) as evidenced by energy intake < or equal to 75% for > or equal to 1 month, severe fat depletion, severe muscle depletion, percent weight loss.  Ongoing  GOAL:   Patient will meet greater than or equal to 90% of their needs  Met with TPN  MONITOR:   PO intake, Supplement acceptance, Weight trends, Labs, Diet advancement, I & O's  REASON FOR ASSESSMENT:   Consult New TPN/TNA  ASSESSMENT:   Patient with PMH significant for GERD, and perforated peptic ulcer disease (possible surgery?). Presents this admission with free air ascites with thicken bowel loops from unclear etiology.    2/14- TPN started   Free air still seen with no definitive leak. Plan GI consult for endoscopy to rule out malignancy.   Pt remains NPO. Tolerating TPN @ goal rate of 80 ml/hr (provides 2070 kcal and 105 g protein). Meets 100% of needs.   A wt has not been obtained since 2/14. Recommend checking daily weights to monitor trends.   NGT: 1450 ml x 24 hrs  Medications reviewed and include: SS novolog, NS @ 50 ml/hr Labs reviewed: CBG 105-122   Diet Order:   Diet Order            Diet NPO time specified Except for: Ice Chips  Diet effective now              EDUCATION NEEDS:   Not appropriate for education at this time  Skin:  Skin Assessment: Reviewed RN Assessment  Last BM:  2/19  Height:   Ht Readings from Last 1 Encounters:  10/25/18 _0  (1.88 m)    Weight:   Wt Readings from Last 1 Encounters:  10/26/18 54.8 kg    Ideal Body Weight:  86.4 kg  BMI:  Body mass index is 15.51 kg/m.  Estimated Nutritional Needs:   Kcal:  1900-2100 grams  Protein:  95-115  grams  Fluid:  >/= 1.9 L/day   Mariana Single RD, LDN Clinical Nutrition Pager # - [phone]

## 2018-11-01 NOTE — Progress Notes (Signed)
PHARMACY - ADULT TOTAL PARENTERAL NUTRITION CONSULT NOTE   Pharmacy Consult for TPN Indication: bowel rest, severe malnutrition PTA  Patient Measurements: Height: 6' 2"  (188 cm) Weight: 120 lb 13 oz (54.8 kg) IBW/kg (Calculated) : 82.2 TPN AdjBW (KG): 54 Body mass index is 15.51 kg/m. Usual Weight: unknown as patient is poor historian, but reports significant weight loss over the past year  HPI: 86 yoM with PMH PUD with perforated gastric ulcer s/p repair in 2003, GERD presents with worsening chronic abdominal pain and associated weight loss. Cachectic appearing. CT reveals ascites with concern for SBO vs recurrent ulcer disease. Pt is NPO. Consulted by surgery for TPN management. Pt will require nutritional support prior to any surgical intervention.  Central access: Double lumen PICC placed 2/14 TPN start date: 2/14  Significant events:   ASSESSMENT                                                                                               Current Nutrition: NPO except for ice chips  IVF: NS at 50 mL/hr  Today, 11/01/2018:  Glucose (CBG goal 100-150) - CBGs normal prior to TPN. BG 105-110 range over past 24 hours. No SSI required.  Electrolytes - WNL . K 4.2 goal of 4 with concern for SBO, Mg 2.  Renal - Mild AKI on admission - resolved. SCr now WNL  Bicarb normal  LFTs, Alk Phos - WNL. Albumin (1.7) low  TGs - WNL (120, 2/15), (117, 2/17)   Prealbumin - <5 (low, chronic malnutrition)  NUTRITIONAL GOALS                                                                                 RD recs (per note on 2/14): Kcal: 1900 - 2100 grams Protein: 95 - 115 g Fluid: >/= 1.9 L/day  Custom TPN at goal rate of 80 mL/hr will provide: 2070 kcal, 105 g protein, and 326 g dextrose, meeting 100% of patient needs    PLAN                           At 1800 today:  Continue TPN at goal rate of 80 mL/hr providing 100 % of patient needs with 2070 kcal and 105 g  protein  Continue with standard electrolytes with increase Mg slightly to 7.5 meq/L. Cl:Ac 1:2.   continue IVF to 50 mL/hr  Continue sensitive SSI with q6 hr CBG checks  TPN lab panels on Mondays & Thursdays   Dolly Rias RPh 11/01/2018, 7:30 AM Pager [phone]

## 2018-11-01 NOTE — Progress Notes (Signed)
[facility] Progress Note     Subjective: CC: no complaints Patient denies abdominal pain, nausea. Had a BM this AM. 1450 cc out of NGT in last 24h.   Objective: Vital signs in last 24 hours: Temp:  [98.2 F (36.8 C)-99.2 F (37.3 C)] 98.2 F (36.8 C) (02/20 0621) Pulse Rate:  [88-93] 88 (02/20 0621) Resp:  [15-18] 18 (02/20 0621) BP: (93-150)/(30-95) 150/46 (02/20 0621) SpO2:  [98 %-100 %] 100 % (02/20 0621) Last BM Date: 10/31/18  Intake/Output from previous day: 02/19 0701 - 02/20 0700 In: 3927.3 [P.O.:180; I.V.:3650.7; IV Piggyback:96.6] Out: 3276 [Urine:1825; Emesis/NG output:1450; Stool:1] Intake/Output this shift: Total I/O In: -  Out: 175 [Urine:175]  PE: Gen:  Alert, NAD Card:  Regular rate and rhythm Pulm:  Normal effort, clear to auscultation bilaterally Abd: Soft, non-tender, distended, bowel sounds hypoactive, midline scar with small piece suture eroding through skin at superior aspect  Skin: warm and dry, no rashes  Psych: A&Ox3   Lab Results:  No results for input(s): WBC, HGB, HCT, PLT in the last 72 hours. BMET Recent Labs    10/31/18 0443 11/01/18 0400  NA 138 135  K 4.1 4.2  CL 107 104  CO2 26 27  GLUCOSE 107* 110*  BUN 17 18  CREATININE 0.72 0.74  CALCIUM 7.8* 7.8*   PT/INR No results for input(s): LABPROT, INR in the last 72 hours. CMP     Component Value Date/Time   NA 135 11/01/2018 0400   K 4.2 11/01/2018 0400   CL 104 11/01/2018 0400   CO2 27 11/01/2018 0400   GLUCOSE 110 (H) 11/01/2018 0400   BUN 18 11/01/2018 0400   CREATININE 0.74 11/01/2018 0400   CALCIUM 7.8 (L) 11/01/2018 0400   PROT 6.5 11/01/2018 0400   ALBUMIN 1.7 (L) 11/01/2018 0400   AST 29 11/01/2018 0400   ALT 24 11/01/2018 0400   ALKPHOS 124 11/01/2018 0400   BILITOT 0.8 11/01/2018 0400   GFRNONAA >60 11/01/2018 0400   GFRAA >60 11/01/2018 0400   Lipase     Component Value Date/Time   LIPASE 30 10/25/2018 1721        Studies/Results: Dg Ugi W Single Cm (sol Or Thin Ba)  Result Date: 10/31/2018 CLINICAL DATA:  63 year old male inpatient with remote history of repair of perforated gastric ulcer, presents with pneumoperitoneum and clinical concern for perforated gastric ulcer or gastric outlet obstruction. EXAM: WATER SOLUBLE UPPER GI SERIES TECHNIQUE: Single-column upper GI series was performed using water soluble contrast. CONTRAST:  Water-soluble oral contrast. COMPARISON:  10/26/2018 abdominal radiographs and 10/25/2018 CT abdomen/pelvis. FLUOROSCOPY TIME:  Fluoroscopy Time:  2 minutes 6 seconds Radiation Exposure Index (if provided by the fluoroscopic device): 33.9 mGy Number of Acquired Spot Images: 6 FINDINGS: Enteric tube terminates in proximal stomach with side port at the esophagogastric junction. Free intraperitoneal air is noted in the left upper quadrant on the scout radiograph. Mildly dilated small bowel loops are noted in the central abdomen, similar to decreased from prior abdominal radiograph. Grossly normal esophagus on limited views. Oral contrast transits the stomach into the duodenum and proximal small bowel without delay. An ulcer is identified in the anterior proximal body of the stomach. No contrast leak from the stomach into the peritoneal cavity was elicited. There is irregular wall thickening in the anterior proximal gastric wall surrounding the ulcer site. There is narrowing of the body of the stomach. Incidentally noted small bowel malrotation, with the duodenal jejunal junction to the right  of the spine. Proximal small bowel loops are mildly dilated. IMPRESSION: 1. Gastric ulcer identified in the anterior proximal body of the stomach, with surrounding irregular gastric wall thickening, cannot exclude a malignant ulcer. Persistent free intraperitoneal air in the left upper quadrant surrounding the ulcer site. No water-soluble contrast leak from the stomach into the peritoneal cavity  detected. 2. No evidence of gastric outlet obstruction. Nonspecific narrowing of the body of the stomach. 3. Incidental small bowel malrotation. Proximal small bowel dilatation is unchanged from recent CT and abdominal radiograph, probably due to ileus due to peritonitis. These results were called by telephone at the time of interpretation on 10/31/2018 at 11:41 am to Dr. Abigail Miyamoto , who verbally acknowledged these results. Electronically Signed   By: Delbert Phenix M.D.   On: 10/31/2018 12:00    Anti-infectives: Anti-infectives (From admission, onward)   Start     Dose/Rate Route Frequency Ordered Stop   10/26/18 0400  piperacillin-tazobactam (ZOSYN) IVPB 3.375 g     3.375 g 12.5 mL/hr over 240 Minutes Intravenous Every 8 hours 10/26/18 0245     10/25/18 1930  piperacillin-tazobactam (ZOSYN) IVPB 3.375 g     3.375 g 100 mL/hr over 30 Minutes Intravenous  Once 10/25/18 1928 10/25/18 2021       Assessment/Plan Hx of perforated gastric ulcer - s/p repair in 2003 Hx of medication non-compliance AKI- improving   Free air with ascites and thickened bowel loops -Benign exam and no signs of sepsis - this is likely subacute - this could be secondary to obstruction vs recurrent ulcer disease vs perforated gastric cancer? - CA 19-9 elevated - patient is significantly malnourished with temporal wasting - needs TPN and nutritional support prior to any operative intervention - continue PPI BID, TPN and NPO - study yesterday showed no definitive leak, free air still seen - may need GI consult to consider endoscopy to r/o malignancy  Severe protein calorie malnutrition- prealbumin 5.7 2/17, continue TPN for now  FEN: NPO, IVF; protonix BID VTE: SCDs ID: [email or web address] 2/13>>  CA 19-9 elevated, recommend GI consult. Continue NGT and TPN for now. Protonix BID.   LOS: 7 days    Wells Guiles , [facility] 11/01/2018, 10:04 AM Pager: [phone] Consults:  [phone]

## 2018-11-01 NOTE — Progress Notes (Signed)
PROGRESS NOTE  Vincent Park OMA:[id number] DOB: Dec 07, 1955 DOA: 10/25/2018 PCP: Patient, No Pcp Per   LOS: 7 days   Brief Narrative / Interim history: 64 year old [place] male with history of perforated ulcer in 2003 status post repair, reflux, severe protein calorie malnutrition admitted 2/13 with abdominal pain for 2 weeks.  CT abdomen showed dilated thickened small bowel with transition point for obstruction with free air concerning for pneumoperitoneum.  NG tube placed.  General surgery consulted.  Subjective: No major events overnight of this morning.  No complaints this morning.  NG tube in place.  Denies chest pain, dyspnea or abdominal pain.  Reports bowel movement this morning.  Reports melanotic stool.  Assessment & Plan: Principal Problem:   SBO (small bowel obstruction) (HCC) Active Problems:   AKI (acute kidney injury) (HCC)   Cachexia (HCC)   PUD (peptic ulcer disease)   Moderate alcohol consumption   GERD (gastroesophageal reflux disease)   Pneumoperitoneum   Protein-calorie malnutrition, severe (HCC)  SBO/pneumoperitoneum: Noted on CT abdomen. -General surgery managing conservatively for now -UGI on 2/19 showed large ulcer without definite leak -CA 19-9 elevated. -Follow abdominal MRI -Continue TPN-appreciate dietitian and pharmacy help  AKI: Resolved. -Avoid nephrotoxic meds.  Severe protein calorie malnutrition: BMI 15. ?Alcohol abuse.  -Needs work-up for malignancy. -Appreciate dietitian and pharmacy help -Continue TPN -Monitor electrolytes for refeeding syndrome  Scheduled Meds: . heparin injection (subcutaneous)  5,000 Units Subcutaneous Q8H  . insulin aspart  0-9 Units Subcutaneous Q6H  . pantoprazole (PROTONIX) IV  40 mg Intravenous Q12H  . sodium chloride flush  10-40 mL Intracatheter Q12H   Continuous Infusions: . sodium chloride 50 mL/hr at 11/01/18 1233  . piperacillin-tazobactam (ZOSYN)  IV 3.375 g (11/01/18 1307)  . TPN ADULT (ION) 80  mL/hr at 11/01/18 1000  . TPN ADULT (ION)     PRN Meds:.sodium chloride flush  DVT prophylaxis: Subcu Lovenox Code Status: Full code Family Communication: None at bedside Disposition Plan: Remains inpatient  Consultants:   None at surgery  Procedures:   NG tube  Antimicrobials:  None  Objective: Vitals:   10/31/18 2031 10/31/18 2033 11/01/18 0155 11/01/18 0621  BP: (!) 93/30 (!) 111/50 (!) 114/95 (!) 150/46  Pulse: 93 91 93 88  Resp: 18 18 18 18   Temp: 98.6 F (37 C)  99.2 F (37.3 C) 98.2 F (36.8 C)  TempSrc: Oral  Oral Oral  SpO2: 100% 100% 98% 100%  Weight:      Height:        Intake/Output Summary (Last 24 hours) at 11/01/2018 1323 Last data filed at 11/01/2018 1235 Gross per 24 hour  Intake 3480.32 ml  Output 2701 ml  Net 779.32 ml   Filed Weights   10/25/18 1449 10/25/18 2218 10/26/18 1258  Weight: 48 kg 54 kg 54.8 kg    Examination:  GENERAL: Appears frail and chronically ill HEENT: MMM.  Vision and Hearing grossly intact.  Significant temporal muscle wasting NECK: Supple.  No JVD.  LUNGS:  No IWOB. Good air movement. CTAB.  HEART:  RRR. Heart sounds normal.  ABD: Bowel sounds present. Soft. Non tender.  EXT: Significant muscle mass wasting SKIN: no apparent skin lesion.  NEURO: Awake, alert and oriented appropriately.  No gross deficit.  PSYCH: Calm. Normal affect.  Data Reviewed: I have independently reviewed following labs and imaging studies   CBC: Recent Labs  Lab 10/25/18 1721 10/26/18 0500 10/27/18 0542 10/29/18 0406  WBC 10.5 8.5 6.1 4.9  NEUTROABS 9.1*  --  4.8 3.1  HGB 12.5* 11.8* 10.3* 9.8*  HCT 38.0* 36.4* 31.6* 30.1*  MCV 101.6* 101.7* 105.0* 104.9*  PLT 276 319 267 274   Basic Metabolic Panel: Recent Labs  Lab 10/28/18 0436 10/29/18 0406 10/30/18 0500 10/31/18 0443 11/01/18 0400  NA 145 143 141 138 135  K 3.7 3.9 3.7 4.1 4.2  CL 113* 113* 110 107 104  CO2 24 24 24 26 27   GLUCOSE 103* 112* 107* 107* 110*    BUN 23 20 18 17 18   CREATININE 0.99 0.90 0.83 0.72 0.74  CALCIUM 7.9* 8.1* 7.9* 7.8* 7.8*  MG 2.1 2.0 1.8 1.9 2.0  PHOS 3.0 3.1 3.4 3.4 3.4   GFR: Estimated Creatinine Clearance: 72.3 mL/min (by C-G formula based on SCr of 0.74 mg/dL). Liver Function Tests: Recent Labs  Lab 10/25/18 1721 10/27/18 0542 10/29/18 0406 11/01/18 0400  AST 45* 39 34 29  ALT 34 29 27 24   ALKPHOS 134* 97 98 124  BILITOT 0.8 0.5 0.1* 0.8  PROT 7.0 5.8* 5.9* 6.5  ALBUMIN 2.2* 1.7* 1.7* 1.7*   Recent Labs  Lab 10/25/18 1721  LIPASE 30   No results for input(s): AMMONIA in the last 168 hours. Coagulation Profile: Recent Labs  Lab 10/26/18 0500  INR 1.13   Cardiac Enzymes: No results for input(s): CKTOTAL, CKMB, CKMBINDEX, TROPONINI in the last 168 hours. BNP (last 3 results) No results for input(s): PROBNP in the last 8760 hours. HbA1C: No results for input(s): HGBA1C in the last 72 hours. CBG: Recent Labs  Lab 10/31/18 1233 10/31/18 1750 10/31/18 2353 11/01/18 0612 11/01/18 1224  GLUCAP 122* 113* 106* 105* 124*   Lipid Profile: No results for input(s): CHOL, HDL, LDLCALC, TRIG, CHOLHDL, LDLDIRECT in the last 72 hours. Thyroid Function Tests: No results for input(s): TSH, T4TOTAL, FREET4, T3FREE, THYROIDAB in the last 72 hours. Anemia Panel: No results for input(s): VITAMINB12, FOLATE, FERRITIN, TIBC, IRON, RETICCTPCT in the last 72 hours. Urine analysis:    Component Value Date/Time   COLORURINE AMBER (A) 10/25/2018 1816   APPEARANCEUR CLEAR 10/25/2018 1816   LABSPEC 1.020 10/25/2018 1816   PHURINE 5.5 10/25/2018 1816   GLUCOSEU NEGATIVE 10/25/2018 1816   HGBUR NEGATIVE 10/25/2018 1816   BILIRUBINUR NEGATIVE 10/25/2018 1816   KETONESUR NEGATIVE 10/25/2018 1816   PROTEINUR NEGATIVE 10/25/2018 1816   NITRITE NEGATIVE 10/25/2018 1816   LEUKOCYTESUR NEGATIVE 10/25/2018 1816   Sepsis Labs: Invalid input(s): PROCALCITONIN, LACTICIDVEN  No results found for this or any  previous visit (from the past 240 hour(s)).    Radiology Studies: No results found.  Taye T. [facility] Pager [phone]  If 7PM-7AM, please contact night-coverage [email or web address] Password [email or web address] 11/01/2018, 1:23 PM

## 2018-11-02 ENCOUNTER — Inpatient Hospital Stay (HOSPITAL_COMMUNITY): Payer: Self-pay

## 2018-11-02 ENCOUNTER — Encounter (HOSPITAL_COMMUNITY): Payer: Self-pay | Admitting: Interventional Radiology

## 2018-11-02 HISTORY — PX: IR US GUIDE BX ASP/DRAIN: IMG2392

## 2018-11-02 LAB — GLUCOSE, CAPILLARY
GLUCOSE-CAPILLARY: 100 mg/dL — AB (ref 70–99)
Glucose-Capillary: 82 mg/dL (ref 70–99)
Glucose-Capillary: 76 mg/dL (ref 70–99)

## 2018-11-02 MED ORDER — FENTANYL CITRATE (PF) 100 MCG/2ML IJ SOLN
INTRAMUSCULAR | Status: AC
  Filled 2018-11-02: qty 4

## 2018-11-02 MED ORDER — MIDAZOLAM HCL 2 MG/2ML IJ SOLN
INTRAMUSCULAR | Status: AC
  Filled 2018-11-02: qty 4

## 2018-11-02 MED ORDER — MIDAZOLAM HCL 2 MG/2ML IJ SOLN
INTRAMUSCULAR | Status: AC | PRN
  Administered 2018-11-02 (×2): 1 mg via INTRAVENOUS

## 2018-11-02 MED ORDER — LIDOCAINE HCL 1 % IJ SOLN
INTRAMUSCULAR | Status: AC
  Filled 2018-11-02: qty 40

## 2018-11-02 MED ORDER — LIDOCAINE HCL (PF) 1 % IJ SOLN
INTRAMUSCULAR | Status: AC | PRN
  Administered 2018-11-02 (×2): 10 mL

## 2018-11-02 MED ORDER — TRAVASOL 10 % IV SOLN
INTRAVENOUS | Status: AC
  Administered 2018-11-02: 19:00:00 via INTRAVENOUS
  Filled 2018-11-02: qty 1056

## 2018-11-02 MED ORDER — SODIUM CHLORIDE 0.9% FLUSH
5.0000 mL | Freq: Three times a day (TID) | INTRAVENOUS | Status: DC
  Administered 2018-11-03 – 2018-11-22 (×39): 5 mL

## 2018-11-02 MED ORDER — GADOBUTROL 1 MMOL/ML IV SOLN
5.0000 mL | Freq: Once | INTRAVENOUS | Status: AC | PRN
  Administered 2018-11-02: 5 mL via INTRAVENOUS

## 2018-11-02 MED ORDER — FENTANYL CITRATE (PF) 100 MCG/2ML IJ SOLN
INTRAMUSCULAR | Status: AC | PRN
  Administered 2018-11-02 (×2): 50 ug via INTRAVENOUS

## 2018-11-02 NOTE — Progress Notes (Signed)
PROGRESS NOTE  Vincent Park JXB:[id number] DOB: [birth year] DOA: 10/25/2018 PCP: Patient, No Pcp Per   LOS: 8 days   Brief Narrative / Interim history: 63 year old [place] male with history of perforated ulcer in 2003 status post repair, reflux, severe protein calorie malnutrition admitted 2/13 with abdominal pain for 2 weeks.  CT abdomen showed dilated thickened small bowel with transition point for obstruction with free air concerning for pneumoperitoneum. General surgery consulted manage conservatively with NG tube decompression and empiric antibiotic (Zosyn 2/14-->).  Patient had a single column upper GI series performed on 2/19 that showed large ulcer without definite leak.   Bowel obstruction and he is significant muscle mass wasting raised concern for undiagnosed malignancy.  Cancer markers including CA-125, CEA and CA 19-9 ordered.  The later elevated to 202.  MRI abdomen ordered.  In regards to malnutrition, started on TPN with the guidance of nutrition and pharmacy.  NG tube retained per general surgery recommendation.  Subjective: No major events overnight of this morning.  No complaint this morning.  Denies nausea, vomiting or abdominal pain.  Denies chest pain or dyspnea.  Last bowel movement 2/19.  Assessment & Plan: Principal Problem:   SBO (small bowel obstruction) (HCC) Active Problems:   AKI (acute kidney injury) (HCC)   Cachexia (HCC)   PUD (peptic ulcer disease)   Moderate alcohol consumption   GERD (gastroesophageal reflux disease)   Pneumoperitoneum   Protein-calorie malnutrition, severe (HCC)  SBO/pneumoperitoneum: noted on CT abdomen.  Patient having bowel movements.  -Management per general surgery -UGI on 2/19 showed large ulcer without definite leak -CEA and CEA from 125 within normal range.  CA 19-9 elevated to 202. -Follow abdominal MRI-will consider GI consult after MRI -Empirically on Zosyn 2/14--> -Continue TPN-appreciate dietitian and pharmacy  help -Continue PPI. -OOB -PT/OT  AKI: Resolved. -Avoid nephrotoxic meds.  Severe protein calorie malnutrition: BMI 15. ?Alcohol abuse.  -Work-up for malignancy as above -Appreciate dietitian and pharmacy help -Continue TPN -Monitor electrolytes for refeeding syndrome  Scheduled Meds: . heparin injection (subcutaneous)  5,000 Units Subcutaneous Q8H  . insulin aspart  0-9 Units Subcutaneous Q6H  . pantoprazole (PROTONIX) IV  40 mg Intravenous Q12H  . sodium chloride flush  10-40 mL Intracatheter Q12H   Continuous Infusions: . sodium chloride 50 mL/hr at 11/02/18 1002  . piperacillin-tazobactam (ZOSYN)  IV 12.5 mL/hr at 11/02/18 0600  . TPN ADULT (ION) 80 mL/hr at 11/02/18 0600  . TPN ADULT (ION)     PRN Meds:.sodium chloride flush  DVT prophylaxis: Subcu Lovenox Code Status: Full code Family Communication: None at bedside Disposition Plan: Remains inpatient  Consultants:   None at surgery  Procedures:   NG tube  Antimicrobials:  None  Objective: Vitals:   11/01/18 1400 11/01/18 2050 11/02/18 0505 11/02/18 1006  BP: 131/84 133/61 (!) 101/51   Pulse: 93 95 95   Resp: 15 18 18    Temp: 98.6 F (37 C) 98.7 F (37.1 C) 99 F (37.2 C)   TempSrc: Oral Oral Oral   SpO2: 100% 95% 100%   Weight:    57.1 kg  Height:        Intake/Output Summary (Last 24 hours) at 11/02/2018 1305 Last data filed at 11/02/2018 1156 Gross per 24 hour  Intake 2836.4 ml  Output 1800 ml  Net 1036.4 ml   Filed Weights   10/25/18 2218 10/26/18 1258 11/02/18 1006  Weight: 54 kg 54.8 kg 57.1 kg    Examination:  GENERAL: Appears frail and chronically  ill. HEENT: MMM.  Vision and Hearing grossly intact. Temporal muscle wasting. NECK: Supple.  No LAD LUNGS:  No IWOB. Good air movement. CTAB.  HEART:  RRR. Heart sounds normal.  ABD: Bowel sounds present. Soft. Non tender.  EXT: Significant muscle mass wasting. SKIN: no apparent skin lesion.  NEURO: Awake, alert and oriented  appropriately.  No gross deficit.  PSYCH: Calm. Normal affect. Data Reviewed: I have independently reviewed following labs and imaging studies   CBC: Recent Labs  Lab 10/27/18 0542 10/29/18 0406  WBC 6.1 4.9  NEUTROABS 4.8 3.1  HGB 10.3* 9.8*  HCT 31.6* 30.1*  MCV 105.0* 104.9*  PLT 267 274   Basic Metabolic Panel: Recent Labs  Lab 10/28/18 0436 10/29/18 0406 10/30/18 0500 10/31/18 0443 11/01/18 0400  NA 145 143 141 138 135  K 3.7 3.9 3.7 4.1 4.2  CL 113* 113* 110 107 104  CO2 24 24 24 26 27   GLUCOSE 103* 112* 107* 107* 110*  BUN 23 20 18 17 18   CREATININE 0.99 0.90 0.83 0.72 0.74  CALCIUM 7.9* 8.1* 7.9* 7.8* 7.8*  MG 2.1 2.0 1.8 1.9 2.0  PHOS 3.0 3.1 3.4 3.4 3.4   GFR: Estimated Creatinine Clearance: 75.3 mL/min (by C-G formula based on SCr of 0.74 mg/dL). Liver Function Tests: Recent Labs  Lab 10/27/18 0542 10/29/18 0406 11/01/18 0400  AST 39 34 29  ALT 29 27 24   ALKPHOS 97 98 124  BILITOT 0.5 0.1* 0.8  PROT 5.8* 5.9* 6.5  ALBUMIN 1.7* 1.7* 1.7*   No results for input(s): LIPASE, AMYLASE in the last 168 hours. No results for input(s): AMMONIA in the last 168 hours. Coagulation Profile: No results for input(s): INR, PROTIME in the last 168 hours. Cardiac Enzymes: No results for input(s): CKTOTAL, CKMB, CKMBINDEX, TROPONINI in the last 168 hours. BNP (last 3 results) No results for input(s): PROBNP in the last 8760 hours. HbA1C: No results for input(s): HGBA1C in the last 72 hours. CBG: Recent Labs  Lab 11/01/18 1224 11/01/18 1900 11/01/18 2348 11/02/18 0609 11/02/18 1148  GLUCAP 124* 129* 109* 100* 82   Lipid Profile: No results for input(s): CHOL, HDL, LDLCALC, TRIG, CHOLHDL, LDLDIRECT in the last 72 hours. Thyroid Function Tests: No results for input(s): TSH, T4TOTAL, FREET4, T3FREE, THYROIDAB in the last 72 hours. Anemia Panel: No results for input(s): VITAMINB12, FOLATE, FERRITIN, TIBC, IRON, RETICCTPCT in the last 72 hours. Urine  analysis:    Component Value Date/Time   COLORURINE AMBER (A) 10/25/2018 1816   APPEARANCEUR CLEAR 10/25/2018 1816   LABSPEC 1.020 10/25/2018 1816   PHURINE 5.5 10/25/2018 1816   GLUCOSEU NEGATIVE 10/25/2018 1816   HGBUR NEGATIVE 10/25/2018 1816   BILIRUBINUR NEGATIVE 10/25/2018 1816   KETONESUR NEGATIVE 10/25/2018 1816   PROTEINUR NEGATIVE 10/25/2018 1816   NITRITE NEGATIVE 10/25/2018 1816   LEUKOCYTESUR NEGATIVE 10/25/2018 1816   Sepsis Labs: Invalid input(s): PROCALCITONIN, LACTICIDVEN  No results found for this or any previous visit (from the past 240 hour(s)).    Radiology Studies: No results found.  Aerabella Galasso T. [facility] Pager [phone]  If 7PM-7AM, please contact night-coverage [email or web address] Password [email or web address] 11/02/2018, 1:05 PM

## 2018-11-02 NOTE — Progress Notes (Signed)
PHARMACY - ADULT TOTAL PARENTERAL NUTRITION CONSULT NOTE   Pharmacy Consult for TPN Indication: bowel rest, severe malnutrition PTA  Patient Measurements: Height: _0  (188 cm) Weight: 120 lb 13 oz (54.8 kg) IBW/kg (Calculated) : 82.2 TPN AdjBW (KG): 54 Body mass index is 15.51 kg/m. Usual Weight: unknown as patient is poor historian, but reports significant weight loss over the past year  HPI: 18 yoM with PMH PUD with perforated gastric ulcer s/p repair in 2003, GERD presents with worsening chronic abdominal pain and associated weight loss. Cachectic appearing. CT reveals ascites with concern for SBO vs recurrent ulcer disease. Pt is NPO. Consulted by surgery for TPN management. Pt will require nutritional support prior to any surgical intervention.  Central access: Double lumen PICC placed 2/14 TPN start date: 2/14  Significant events:   ASSESSMENT                                                                                               Current Nutrition: NPO except for ice chips  IVF: NS at 50 mL/hr  Today, 11/02/2018:  Glucose (CBG goal 100-150) - CBGs normal prior to TPN. BG 100-109 range over past 24 hours. No SSI required.   -Last labs 2/19   Electrolytes - WNL . K 4.2 goal of 4 with concern for SBO, Mg 2.  Renal - Mild AKI on admission - resolved. SCr now WNL  Bicarb normal  LFTs, Alk Phos - WNL. Albumin (1.7) low  TGs - WNL (120, 2/15), (117, 2/17)   Prealbumin - <5 (low, chronic malnutrition)  NUTRITIONAL GOALS                                                                                 RD recs (per note on 2/14): Kcal: 1900 - 2100 grams Protein: 95 - 115 g Fluid: >/= 1.9 L/day  Custom TPN at goal rate of 80 mL/hr will provide: 2070 kcal, 105 g protein, and 326 g dextrose, meeting 100% of patient needs    PLAN                           At 1800 today:  Continue TPN at goal rate of 80 mL/hr providing 100 % of patient needs with 2070 kcal and  105 g protein  Continue with standard electrolytes with increase Mg slightly to 7.5 meq/L. Cl:Ac 1:2.   continue IVF to 50 mL/hr  Continue sensitive SSI with q6 hr CBG checks  Check BMet with mag and phos in am  TPN lab panels on Mondays & Thursdays   Dolly Rias RPh 11/02/2018, 7:32 AM Pager [phone]

## 2018-11-02 NOTE — Progress Notes (Signed)
[facility] Progress Note     Subjective: CC: no complaints Patient denies abdominal pain or nausea. Having bowel function. NGT output bilious.   Objective: Vital signs in last 24 hours: Temp:  [98.6 F (37 C)-99 F (37.2 C)] 99 F (37.2 C) (02/21 0505) Pulse Rate:  [93-95] 95 (02/21 0505) Resp:  [15-18] 18 (02/21 0505) BP: (101-133)/(51-84) 101/51 (02/21 0505) SpO2:  [95 %-100 %] 100 % (02/21 0505) Last BM Date: 10/31/18  Intake/Output from previous day: 02/20 0701 - 02/21 0700 In: 3346.3 [I.V.:3204.7; IV Piggyback:141.6] Out: 2000 [Urine:1650; Emesis/NG output:350] Intake/Output this shift: Total I/O In: -  Out: 300 [Urine:300]  PE: Gen: Alert, NAD Card: Regular rate and rhythm Pulm: Normal effort, clear to auscultation bilaterally Abd: Soft, non-tender, mildly distended, +BS,midline scar with small piece suture eroding through skin at superior aspect Skin: warm and dry, no rashes  Psych: A&Ox3   Lab Results:  No results for input(s): WBC, HGB, HCT, PLT in the last 72 hours. BMET Recent Labs    10/31/18 0443 11/01/18 0400  NA 138 135  K 4.1 4.2  CL 107 104  CO2 26 27  GLUCOSE 107* 110*  BUN 17 18  CREATININE 0.72 0.74  CALCIUM 7.8* 7.8*   PT/INR No results for input(s): LABPROT, INR in the last 72 hours. CMP     Component Value Date/Time   NA 135 11/01/2018 0400   K 4.2 11/01/2018 0400   CL 104 11/01/2018 0400   CO2 27 11/01/2018 0400   GLUCOSE 110 (H) 11/01/2018 0400   BUN 18 11/01/2018 0400   CREATININE 0.74 11/01/2018 0400   CALCIUM 7.8 (L) 11/01/2018 0400   PROT 6.5 11/01/2018 0400   ALBUMIN 1.7 (L) 11/01/2018 0400   AST 29 11/01/2018 0400   ALT 24 11/01/2018 0400   ALKPHOS 124 11/01/2018 0400   BILITOT 0.8 11/01/2018 0400   GFRNONAA >60 11/01/2018 0400   GFRAA >60 11/01/2018 0400   Lipase     Component Value Date/Time   LIPASE 30 10/25/2018 1721       Studies/Results: Dg Ugi W Single Cm (sol Or Thin  Ba)  Result Date: 10/31/2018 CLINICAL DATA:  63 year old male inpatient with remote history of repair of perforated gastric ulcer, presents with pneumoperitoneum and clinical concern for perforated gastric ulcer or gastric outlet obstruction. EXAM: WATER SOLUBLE UPPER GI SERIES TECHNIQUE: Single-column upper GI series was performed using water soluble contrast. CONTRAST:  Water-soluble oral contrast. COMPARISON:  10/26/2018 abdominal radiographs and 10/25/2018 CT abdomen/pelvis. FLUOROSCOPY TIME:  Fluoroscopy Time:  2 minutes 6 seconds Radiation Exposure Index (if provided by the fluoroscopic device): 33.9 mGy Number of Acquired Spot Images: 6 FINDINGS: Enteric tube terminates in proximal stomach with side port at the esophagogastric junction. Free intraperitoneal air is noted in the left upper quadrant on the scout radiograph. Mildly dilated small bowel loops are noted in the central abdomen, similar to decreased from prior abdominal radiograph. Grossly normal esophagus on limited views. Oral contrast transits the stomach into the duodenum and proximal small bowel without delay. An ulcer is identified in the anterior proximal body of the stomach. No contrast leak from the stomach into the peritoneal cavity was elicited. There is irregular wall thickening in the anterior proximal gastric wall surrounding the ulcer site. There is narrowing of the body of the stomach. Incidentally noted small bowel malrotation, with the duodenal jejunal junction to the right of the spine. Proximal small bowel loops are mildly dilated. IMPRESSION: 1. Gastric ulcer  identified in the anterior proximal body of the stomach, with surrounding irregular gastric wall thickening, cannot exclude a malignant ulcer. Persistent free intraperitoneal air in the left upper quadrant surrounding the ulcer site. No water-soluble contrast leak from the stomach into the peritoneal cavity detected. 2. No evidence of gastric outlet obstruction. Nonspecific  narrowing of the body of the stomach. 3. Incidental small bowel malrotation. Proximal small bowel dilatation is unchanged from recent CT and abdominal radiograph, probably due to ileus due to peritonitis. These results were called by telephone at the time of interpretation on 10/31/2018 at 11:41 am to Dr. Abigail Miyamoto , who verbally acknowledged these results. Electronically Signed   By: Delbert Phenix M.D.   On: 10/31/2018 12:00    Anti-infectives: Anti-infectives (From admission, onward)   Start     Dose/Rate Route Frequency Ordered Stop   10/26/18 0400  piperacillin-tazobactam (ZOSYN) IVPB 3.375 g     3.375 g 12.5 mL/hr over 240 Minutes Intravenous Every 8 hours 10/26/18 0245     10/25/18 1930  piperacillin-tazobactam (ZOSYN) IVPB 3.375 g     3.375 g 100 mL/hr over 30 Minutes Intravenous  Once 10/25/18 1928 10/25/18 2021       Assessment/Plan Hx of perforated gastric ulcer - s/p repair in 2003 Hx of medication non-compliance AKI- improving   Free air with ascites and thickened bowel loops -Benign exam and no signs of sepsis - this is likely subacute - this could be secondary to obstruction vs recurrent ulcer disease vs perforated gastric cancer? - CA 19-9 elevated - patient is significantly malnourished with temporal wasting - needs TPN and nutritional support prior to any operative intervention - continue PPI BID, TPN and NPO - study yesterday showed no definitive leak, free air still seen - MR abdomen ordered  - may need GI consult to consider endoscopy to r/o malignancy  Severe protein calorie malnutrition- prealbumin5.7 2/17, continue TPN for now  FEN: NPO, IVF; protonix BID VTE: SCDs ID: [email or web address] 2/13>>  CA 19-9 elevated, recommend MRI and likely eventual GI consult. Continue NGT and TPN for now. Protonix BID.   LOS: 8 days    Wells Guiles , [facility] 11/02/2018, 10:02 AM Pager: [phone] Consults: [phone]

## 2018-11-02 NOTE — Progress Notes (Signed)
Pharmacy notified of discontinued of TPN by IV team

## 2018-11-02 NOTE — Progress Notes (Signed)
MRI called to scheduled  patient today at 2pm. Needed patient to be NPO 4hrs prior to that. Writer consulted IV team

## 2018-11-02 NOTE — Procedures (Signed)
Bilateral abd fld collections  S/p [place] BILATERAL FLANK ABDOMINAL DRAINS  PUS ASPIRATED BILATERALLY CX AND CYTO SENT  No comp Stable ebl min Drains to graivty Full report in pacs

## 2018-11-02 NOTE — Consult Note (Signed)
Chief Complaint: Patient was seen in consultation today for insertion of bilateral flank fluid drains Chief Complaint  Patient presents with  . Abdominal Pain    Referring Physician(s): Candelaria Stagers  Supervising Physician: Ruel Favors  Patient Status: [facility] - In-pt  History of Present Illness: Vincent Park is a 63 y.o. male who presents for placement of bilateral flank drains. He was admitted to the hospital on 2/13 with abdominal pain that had been occurring for last 2 weeks. MRI was ordered on 2/21 that showed large bilateral abdominal fluid collections, suspect multiple intra-abdominal abscesses, masslike wall thickening of proximal stomach, and abnormal distension of small bowel loops. Patient was referred to IR for drainage of the fluid.   Past Medical History:  Diagnosis Date  . GERD (gastroesophageal reflux disease)   . Gonorrhea 2009  . Hematuria 2009  . Peptic ulcer 2003   ?ulcer surgery in [place]?  Marland Kitchen UTI (urinary tract infection) 2009    Past Surgical History:  Procedure Laterality Date  . REPAIR OF PERFORATED ULCER  [place], [place] ?    Allergies: Patient has no known allergies.  Medications: Prior to Admission medications   Medication Sig Start Date End Date Taking? Authorizing Provider  Alum & Mag Hydroxide-Simeth (GI COCKTAIL) SUSP suspension Take 30 mLs by mouth 2 (two) times daily. Shake well. 10/10/18  Yes Gwyneth Sprout, MD  omeprazole (PRILOSEC) 20 MG capsule Take 1 capsule (20 mg total) by mouth daily. 10/10/18  Yes Gwyneth Sprout, MD     History reviewed. No pertinent family history.  Social History   Socioeconomic History  . Marital status: Single    Spouse name: Not on file  . Number of children: Not on file  . Years of education: Not on file  . Highest education level: Not on file  Occupational History  . Not on file  Social Needs  . Financial resource strain: Hard  . Food insecurity:    Worry: Not on file    Inability:  Not on file  . Transportation needs:    Medical: Yes    Non-medical: Not on file  Tobacco Use  . Smoking status: Current Every Day Smoker    Packs/day: 0.50    Types: Cigars, Cigarettes  . Smokeless tobacco: Never Used  Substance and Sexual Activity  . Alcohol use: Yes    Comment: 3-6 cans/week  . Drug use: Yes    Types: Marijuana  . Sexual activity: Not on file  Lifestyle  . Physical activity:    Days per week: Not on file    Minutes per session: Not on file  . Stress: Not on file  Relationships  . Social connections:    Talks on phone: Not on file    Gets together: Not on file    Attends religious service: Not on file    Active member of club or organization: Not on file    Attends meetings of clubs or organizations: Not on file    Relationship status: Not on file  Other Topics Concern  . Not on file  Social History Narrative   Originally from [place]   Lived in [place] ~ 2003   Lived in [place] since 2007      Review of Systems  No headache, fever, or recent illness present. Patient denies chest pain, bleeding, and back pain. Patient reports mild abdominal tenderness diffusely over abdomen.  Vital Signs: BP (!) 159/81 (BP Location: Left Arm)   Pulse  86   Temp 97.9 F (36.6 C) (Oral)   Resp 16   Ht 6\' 2"  (1.88 m)   Wt 125 lb 14.1 oz (57.1 kg)   SpO2 99%   BMI 16.16 kg/m   Physical Exam  Patient is awake and alert, appears cachectic but is resting comfortably in bed. Breathing is non-labored and lungs are clear to auscultation bilaterally. Normal rate and rhythm without murmurs/rubs. Bowel sounds are present, abdomen is mildly tender to palpation. No lower extremity edema present.  Imaging: Dg Chest 2 View  Result Date: 10/27/2018 CLINICAL DATA:  Dyspnea on exertion. EXAM: CHEST - 2 VIEW COMPARISON:  One view abdomen 10/26/2018.  Abdominal CT 10/25/2018. FINDINGS: 0920 hours. Right arm PICC extends to the superior cavoatrial junction.  Nasogastric tube is in place, tip not visualized on the frontal examination. On the lateral view, the NG tube extends in the proximal stomach. The heart size and mediastinal contours are normal. There is minimal right base atelectasis. The lungs are otherwise clear. There is no pleural effusion or pneumothorax. IMPRESSION: Minimal right basilar atelectasis. No other active cardiopulmonary process. Electronically Signed   By: Carey Bullocks M.D.   On: 10/27/2018 13:20   Mr Abdomen W Wo Contrast  Result Date: 11/02/2018 CLINICAL DATA:  Evaluate for malignancy. EXAM: MRI ABDOMEN WITHOUT AND WITH CONTRAST TECHNIQUE: Multiplanar multisequence MR imaging of the abdomen was performed both before and after the administration of intravenous contrast. CONTRAST:  5 cc of Gadavist. COMPARISON:  10/25/2018 FINDINGS: Lower chest: Small right pleural effusion is new from previous exam. Hepatobiliary: Mild hepatic steatosis. No focal enhancing liver lesions identified. The gallbladder appears normal. The common bile duct measures 6 mm in maximum dimension. Pancreas: Normal appearance of the pancreas. No inflammation mass or main duct dilatation. Spleen:  Within normal limits in size and appearance. Adrenals/Urinary Tract: Normal appearance of the adrenal glands. Small 8 mm cyst noted within the left kidney. No enhancing mass or hydronephrosis. Stomach/Bowel: Masslike thickening of the proximal wall of stomach is identified. This measures approximately 8.5 cm in length and has a thickness of 3.1 cm. This is nonspecific and although this may be due to gastritis underlying mass is not excluded. Persistent abnormal dilatation of small-bowel loops is again identified measuring up to 3.8 cm in diameter. Gaseous distension of the colon is noted as well. Vascular/Lymphatic: Normal appearance of the abdominal aorta. No aneurysm. No definitive adenopathy noted at this time. Other: There are multiple large peripherally enhancing fluid  collections identified within both sides of the abdomen. Within the left abdomen fluid collection measures 15.1 by 8.3 by 21.1 cm. In the right hemiabdomen there is a large fluid collection measuring 8.3 x 14.8 by 21.0 cm. These appear more well defined with progressive mural enhancement compatible with mature fluid collections. There is progressive mass effect along the dome of liver, image 19/903. Continued foci of pneumoperitoneum also noted, for example overlying anterior left lobe of liver, image 32/901. Musculoskeletal: No suspicious bone lesions identified. IMPRESSION: 1. Large bilateral abdominal fluid collections are identified on today's exam. These appear increased in size with peripheral enhancement compatible with maturing fluid collections. There is also continued pneumoperitoneum. Findings are compatible with perforated viscus. Suspect multiple intra-abdominal abscesses. 2. There is nonspecific, masslike wall thickening involving the proximal stomach. Findings may be due to gastritis. Underlying mass would be difficult to exclude and correlation with endoscopic inspection should be considered. 3. Persistent abnormal distension of small bowel loops. There is also gaseous distension of  the colon. In the setting of perforated viscus with multiple intra-abdominal fluid collections assist a nonspecific finding and may reflect inflammatory ileus. Bowel obstruction can up be excluded. 4. New small right pleural effusion. Electronically Signed   By: Signa Kellaylor  Stroud M.D.   On: 11/02/2018 15:03   Ct Abdomen Pelvis W Contrast  Result Date: 10/25/2018 CLINICAL DATA:  63 y/o male with abd 'burning' x3 days. EXAM: CT ABDOMEN AND PELVIS WITH CONTRAST TECHNIQUE: Multidetector CT imaging of the abdomen and pelvis was performed using the standard protocol following bolus administration of intravenous contrast. CONTRAST:  100mL ISOVUE-300 IOPAMIDOL (ISOVUE-300) INJECTION 61% COMPARISON:  None. FINDINGS: Lower chest:  No acute abnormality. Hepatobiliary: There hepatic fluid. No focal liver lesions. Gallbladder is present. Pancreas: Unremarkable. No pancreatic ductal dilatation or surrounding inflammatory changes. Spleen: Normal in size without focal abnormality. Adrenals/Urinary Tract: Adrenal glands are normal. Small low-attenuation lesion within the midpole of the LEFT kidney is less than 1 centimeter not fully characterize. There is no hydronephrosis. The bladder and visualized portion of the urethra are normal. Stomach/Bowel: The stomach is normal in appearance. 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 There is marked dilatation of small bowel loops. The most distal ileal loops are not dilated. There is significant wall thickening associated with central bowel loops and multiple angular changes in caliber, raising the question of adhesions. Within a mid to distal small bowel loop in the RIGHT central pelvis, there is radiopaque intraluminal structure measuring 10 millimeters and possibly representing a small ingested bone fragment. The colon is decompressed and otherwise normal in appearance. There is significant ascites and free intraperitoneal air with air-fluid level in the LEFT UPPER QUADRANT. There is enhancement of the peritoneum. Vascular/Lymphatic: No significant vascular findings are present. No enlarged abdominal or pelvic lymph nodes. Reproductive: Prostate is unremarkable. Other: Large volume ascites, free intraperitoneal air, and peritoneal enhancement. Cachexia. Musculoskeletal: No acute or significant osseous findings. IMPRESSION: 1. High-grade small bowel obstruction.  Thickened small bowel loops. 2. Question of ingested foreign body within a distal small bowel loop in the RIGHT hemipelvis. See above. 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 4. Significant ascites, free intraperitoneal air, and peritoneal enhancement. Critical Value/emergent results were  called by telephone at the time of interpretation on 10/25/2018 at 7:13 pm to provider [facility] CamelbackKELLY GEKAS , who verbally acknowledged these results. Electronically Signed   By: Norva PavlovElizabeth  Brown M.D.   On: 10/25/2018 19:18   Dg Abd Portable 1v  Result Date: 10/26/2018 CLINICAL DATA:  NG tube placement EXAM: PORTABLE ABDOMEN - 1 VIEW COMPARISON:  CT 10/25/2018 FINDINGS: NG tube tip is in the mid stomach. Dilated centralized small bowel loops compatible with small bowel obstruction ascites as seen on prior CT. IMPRESSION: NG tube tip in the mid stomach. Electronically Signed   By: Charlett NoseKevin  Dover M.D.   On: 10/26/2018 02:51   Dg Kayleen MemosUgi W Single Cm (sol Or Thin Ba)  Result Date: 10/31/2018 CLINICAL DATA:  63 year old male inpatient with remote history of repair of perforated gastric ulcer, presents with pneumoperitoneum and clinical concern for perforated gastric ulcer or gastric outlet obstruction. EXAM: WATER SOLUBLE UPPER GI SERIES TECHNIQUE: Single-column upper GI series was performed using water soluble contrast. CONTRAST:  Water-soluble oral contrast. COMPARISON:  10/26/2018 abdominal radiographs and 10/25/2018 CT abdomen/pelvis. FLUOROSCOPY TIME:  Fluoroscopy Time:  2 minutes 6 seconds Radiation Exposure Index (if provided by the fluoroscopic device): 33.9 mGy Number of Acquired Spot Images: 6 FINDINGS: Enteric  tube terminates in proximal stomach with side port at the esophagogastric junction. Free intraperitoneal air is noted in the left upper quadrant on the scout radiograph. Mildly dilated small bowel loops are noted in the central abdomen, similar to decreased from prior abdominal radiograph. Grossly normal esophagus on limited views. Oral contrast transits the stomach into the duodenum and proximal small bowel without delay. An ulcer is identified in the anterior proximal body of the stomach. No contrast leak from the stomach into the peritoneal cavity was elicited. There is irregular wall thickening in the  anterior proximal gastric wall surrounding the ulcer site. There is narrowing of the body of the stomach. Incidentally noted small bowel malrotation, with the duodenal jejunal junction to the right of the spine. Proximal small bowel loops are mildly dilated. IMPRESSION: 1. Gastric ulcer identified in the anterior proximal body of the stomach, with surrounding irregular gastric wall thickening, cannot exclude a malignant ulcer. Persistent free intraperitoneal air in the left upper quadrant surrounding the ulcer site. No water-soluble contrast leak from the stomach into the peritoneal cavity detected. 2. No evidence of gastric outlet obstruction. Nonspecific narrowing of the body of the stomach. 3. Incidental small bowel malrotation. Proximal small bowel dilatation is unchanged from recent CT and abdominal radiograph, probably due to ileus due to peritonitis. These results were called by telephone at the time of interpretation on 10/31/2018 at 11:41 am to Dr. Abigail Miyamoto , who verbally acknowledged these results. Electronically Signed   By: Delbert Phenix M.D.   On: 10/31/2018 12:00   [place] Ekg Site Rite  Result Date: 10/26/2018 If Site Rite image not attached, placement could not be confirmed due to current cardiac rhythm.   Labs:  CBC: Recent Labs    10/25/18 1721 10/26/18 0500 10/27/18 0542 10/29/18 0406  WBC 10.5 8.5 6.1 4.9  HGB 12.5* 11.8* 10.3* 9.8*  HCT 38.0* 36.4* 31.6* 30.1*  PLT 276 319 267 274    COAGS: Recent Labs    10/26/18 0500  INR 1.13    BMP: Recent Labs    10/29/18 0406 10/30/18 0500 10/31/18 0443 11/01/18 0400  NA 143 141 138 135  K 3.9 3.7 4.1 4.2  CL 113* 110 107 104  CO2 24 24 26 27   GLUCOSE 112* 107* 107* 110*  BUN 20 18 17 18   CALCIUM 8.1* 7.9* 7.8* 7.8*  CREATININE 0.90 0.83 0.72 0.74  GFRNONAA >60 >60 >60 >60  GFRAA >60 >60 >60 >60    LIVER FUNCTION TESTS: Recent Labs    10/25/18 1721 10/27/18 0542 10/29/18 0406 11/01/18 0400  BILITOT  0.8 0.5 0.1* 0.8  AST 45* 39 34 29  ALT 34 29 27 24   ALKPHOS 134* 97 98 124  PROT 7.0 5.8* 5.9* 6.5  ALBUMIN 2.2* 1.7* 1.7* 1.7*    TUMOR MARKERS: No results for input(s): AFPTM, CEA, CA199, CHROMGRNA in the last 8760 hours.  Assessment and Plan: Bilateral abdominal fluid collections, protein-calorie malnutrition, and possible intra abdominal abscesses. Patient is to have bilateral flank fluid aspiration with possible placement of drains this afternoon. Imaging has been reviewed by Dr. Miles Costain. Risks and benefits discussed with the patient including bleeding, infection, damage to adjacent structures, bowel perforation/fistula connection, and sepsis.  All of the patient's questions were answered, patient is agreeable to proceed. Consent signed and in chart.   Thank you for this interesting consult.  I greatly enjoyed meeting Amadeus Oyama and look forward to participating in their care.  A copy of this report  was sent to the requesting provider on this date.  Electronically Signed: D. Jeananne Rama, PA-C/Maxwell Dennie Bible, PA-S 11/02/2018, 3:54 PM   I spent a total of 20 minutes in face to face in clinical consultation, greater than 50% of which was counseling/coordinating care for bilateral abdominal fluid drain placement consult.

## 2018-11-03 ENCOUNTER — Inpatient Hospital Stay (HOSPITAL_COMMUNITY): Payer: Self-pay

## 2018-11-03 DIAGNOSIS — K651 Peritoneal abscess: Secondary | ICD-10-CM

## 2018-11-03 LAB — BASIC METABOLIC PANEL
Anion gap: 6 (ref 5–15)
BUN: 20 mg/dL (ref 8–23)
CO2: 28 mmol/L (ref 22–32)
Calcium: 7.9 mg/dL — ABNORMAL LOW (ref 8.9–10.3)
Chloride: 101 mmol/L (ref 98–111)
Creatinine, Ser: 0.81 mg/dL (ref 0.61–1.24)
GFR calc Af Amer: >60 mL/min (ref >60)
Glucose, Bld: 116 mg/dL — ABNORMAL HIGH (ref 70–99)
Potassium: 4.1 mmol/L (ref 3.5–5.1)
Sodium: 135 mmol/L (ref 135–145)

## 2018-11-03 LAB — CBC
HCT: 29.8 % — ABNORMAL LOW (ref 39.0–52.0)
Hemoglobin: 9.5 g/dL — ABNORMAL LOW (ref 13.0–17.0)
MCH: 33.1 pg (ref 26.0–34.0)
MCHC: 31.9 g/dL (ref 30.0–36.0)
MCV: 103.8 fL — ABNORMAL HIGH (ref 80.0–100.0)
Platelets: 308 10*3/uL (ref 150–400)
RBC: 2.87 MIL/uL — ABNORMAL LOW (ref 4.22–5.81)
RDW: 13.4 % (ref 11.5–15.5)
WBC: 3.4 10*3/uL — ABNORMAL LOW (ref 4.0–10.5)
nRBC: 0 % (ref 0.0–0.2)

## 2018-11-03 LAB — GLUCOSE, CAPILLARY
GLUCOSE-CAPILLARY: 133 mg/dL — AB (ref 70–99)
Glucose-Capillary: 109 mg/dL — ABNORMAL HIGH (ref 70–99)
Glucose-Capillary: 117 mg/dL — ABNORMAL HIGH (ref 70–99)
Glucose-Capillary: 117 mg/dL — ABNORMAL HIGH (ref 70–99)

## 2018-11-03 LAB — PHOSPHORUS: Phosphorus: 3.9 mg/dL (ref 2.5–4.6)

## 2018-11-03 LAB — MAGNESIUM: Magnesium: 1.9 mg/dL (ref 1.7–2.4)

## 2018-11-03 MED ORDER — TRAVASOL 10 % IV SOLN
INTRAVENOUS | Status: AC
  Administered 2018-11-03: 18:00:00 via INTRAVENOUS
  Filled 2018-11-03: qty 1056

## 2018-11-03 MED ORDER — MELATONIN 3 MG PO TABS
6.0000 mg | ORAL_TABLET | Freq: Every evening | ORAL | Status: DC | PRN
  Administered 2018-11-03: 6 mg via ORAL
  Filled 2018-11-03 (×2): qty 2

## 2018-11-03 NOTE — Progress Notes (Signed)
PROGRESS NOTE  [place] ZOX:[id number] DOB: December 21, 1955 DOA: 10/25/2018 PCP: Patient, No Pcp Per   LOS: 9 days   Brief Narrative / Interim history: 63 year old [place] male with history of perforated ulcer in 2003 status post repair, reflux, severe protein calorie malnutrition admitted 2/13 with abdominal pain for 2 weeks.  Patient recently moved from [place] to [place].  Has no PCP.  Has not seen a physician in years.  In ED, CT abdomen showed dilated thickened small bowel with transition point for obstruction with free air concerning for pneumoperitoneum. General surgery consulted manage conservatively with NG tube decompression and empiric antibiotic (Zosyn 2/14-->).  Patient had a single column upper GI series performed on 2/19 that showed large ulcer without definite leak.   Bowel obstruction, history of gastric ulcer and significant muscle mass wasting raised concern for undiagnosed malignancy.  Cancer markers including CA-125, CEA and CA 19-9 ordered.  The later elevated to 202.  MRI abdomen on 2/21 showed multiple intra-abdominal abscesses, 15.1 x 8.3 x 21 cm on the left abdomen and 8.3 x 14.8 x 21 cm in the right abdomen.  There was also a masslike nonspecific wall thickening involving the proximal stomach.  IR consulted and drained the abscesses and placed drains.  [facility] consulted for possible EGD on 2/22.  In regards to malnutrition, started on TPN with the guidance of nutrition and pharmacy.  NG tube retained per general surgery recommendation.  Subjective: No major events overnight of this morning.  No complaints this morning.  Denies nausea, vomiting or abdominal pain.  Denies chest pain or dyspnea  Assessment & Plan: Principal Problem:   SBO (small bowel obstruction) (HCC) Active Problems:   AKI (acute kidney injury) (HCC)   Cachexia (HCC)   PUD (peptic ulcer disease)   Moderate alcohol consumption   GERD (gastroesophageal reflux disease)   Pneumoperitoneum  Protein-calorie malnutrition, severe (HCC)  SBO/pneumoperitoneum/intra-abdominal abscesses/ascites:  -CT abdomen and pelvis on 10/25/2018 concerning for high-grade SBO, partial malrotation of small bowel, significant ascites, free intraperitoneal air and peritoneal enhancement. -Abdominal x-ray on 2/14 showed SBO with ascites again. -SBO resolved with NGT decompression. Patient has been having BM's but surgery keeping NGT -UGI on 2/19 showed large ulcer without definite leak -CEA and CEA from 125 within normal range.  CA 19-9 elevated to 202. -MRI abdomen on 2/21 showed multiple intra-abdominal abscesses and nonspecific proximal stomach wall thickening -IR consulted and drained the abscesses and placed 2 drains on 2/21 -[facility] consulted for nonspecific proximal stomach wall thickening/possible EGD -Zosyn 2/14--> -Continue TPN-appreciate dietitian and pharmacy help -Continue PPI. -OOB -PT/OT  AKI: Resolved. -Avoid nephrotoxic meds.  Severe protein calorie malnutrition: BMI 15. ?Alcohol abuse.  -Work-up for malignancy as above -Appreciate dietitian and pharmacy help -Continue TPN -Monitor electrolytes for refeeding syndrome  Scheduled Meds: . heparin injection (subcutaneous)  5,000 Units Subcutaneous Q8H  . insulin aspart  0-9 Units Subcutaneous Q6H  . pantoprazole (PROTONIX) IV  40 mg Intravenous Q12H  . sodium chloride flush  10-40 mL Intracatheter Q12H  . sodium chloride flush  5 mL Intracatheter Q8H   Continuous Infusions: . sodium chloride 50 mL/hr at 11/03/18 0727  . piperacillin-tazobactam (ZOSYN)  IV 3.375 g (11/03/18 0511)  . TPN ADULT (ION) 80 mL/hr at 11/02/18 1849  . TPN ADULT (ION)     PRN Meds:.sodium chloride flush  DVT prophylaxis: Subcu Lovenox Code Status: Full code Family Communication: None at bedside Disposition Plan: Remains inpatient  Consultants:   None at surgery  Procedures:   NG tube  Antimicrobials:  None  Objective: Vitals:    11/02/18 1804 11/02/18 1834 11/02/18 2114 11/03/18 0559  BP: (!) 152/76 (!) 150/88 124/74 138/68  Pulse: 91 88 87 79  Resp: 16 17 (!) 22 20  Temp: 97.8 F (36.6 C) 98 F (36.7 C) 97.8 F (36.6 C) 98.2 F (36.8 C)  TempSrc: Oral Oral Oral Oral  SpO2: 100% 99% 100% 99%  Weight:      Height:        Intake/Output Summary (Last 24 hours) at 11/03/2018 0944 Last data filed at 11/03/2018 0900 Gross per 24 hour  Intake 2399.28 ml  Output 2460 ml  Net -60.72 ml   Filed Weights   10/25/18 2218 10/26/18 1258 11/02/18 1006  Weight: 54 kg 54.8 kg 57.1 kg    Examination:  GENERAL: Appears chronically ill HEENT: MMM.  Vision and Hearing grossly intact.  Significant temporal muscle wasting. NECK: Supple.  No LAD. LUNGS:  No IWOB. Good air movement. CTAB.  HEART:  RRR. Heart sounds normal.  ABD: Bowel sounds present. Soft. Non tender.  Drains in place and draining some purulent material EXT:   no edema bilaterally.  Significant muscle mass wasting SKIN: no apparent skin lesion.  NEURO: Awake, alert and oriented appropriately.  No gross deficit.  PSYCH: Calm. Normal affect.  Data Reviewed: I have independently reviewed following labs and imaging studies   CBC: Recent Labs  Lab 10/29/18 0406 11/03/18 0408  WBC 4.9 3.4*  NEUTROABS 3.1  --   HGB 9.8* 9.5*  HCT 30.1* 29.8*  MCV 104.9* 103.8*  PLT 274 308   Basic Metabolic Panel: Recent Labs  Lab 10/29/18 0406 10/30/18 0500 10/31/18 0443 11/01/18 0400 11/03/18 0408  NA 143 141 138 135 135  K 3.9 3.7 4.1 4.2 4.1  CL 113* 110 107 104 101  CO2 24 24 26 27 28   GLUCOSE 112* 107* 107* 110* 116*  BUN 20 18 17 18 20   CREATININE 0.90 0.83 0.72 0.74 0.81  CALCIUM 8.1* 7.9* 7.8* 7.8* 7.9*  MG 2.0 1.8 1.9 2.0 1.9  PHOS 3.1 3.4 3.4 3.4 3.9   GFR: Estimated Creatinine Clearance: 74.4 mL/min (by C-G formula based on SCr of 0.81 mg/dL). Liver Function Tests: Recent Labs  Lab 10/29/18 0406 11/01/18 0400  AST 34 29  ALT 27 24    ALKPHOS 98 124  BILITOT 0.1* 0.8  PROT 5.9* 6.5  ALBUMIN 1.7* 1.7*   No results for input(s): LIPASE, AMYLASE in the last 168 hours. No results for input(s): AMMONIA in the last 168 hours. Coagulation Profile: No results for input(s): INR, PROTIME in the last 168 hours. Cardiac Enzymes: No results for input(s): CKTOTAL, CKMB, CKMBINDEX, TROPONINI in the last 168 hours. BNP (last 3 results) No results for input(s): PROBNP in the last 8760 hours. HbA1C: No results for input(s): HGBA1C in the last 72 hours. CBG: Recent Labs  Lab 11/02/18 0609 11/02/18 1148 11/02/18 1801 11/03/18 0000 11/03/18 0609  GLUCAP 100* 82 76 117* 117*   Lipid Profile: No results for input(s): CHOL, HDL, LDLCALC, TRIG, CHOLHDL, LDLDIRECT in the last 72 hours. Thyroid Function Tests: No results for input(s): TSH, T4TOTAL, FREET4, T3FREE, THYROIDAB in the last 72 hours. Anemia Panel: No results for input(s): VITAMINB12, FOLATE, FERRITIN, TIBC, IRON, RETICCTPCT in the last 72 hours. Urine analysis:    Component Value Date/Time   COLORURINE AMBER (A) 10/25/2018 1816   APPEARANCEUR CLEAR 10/25/2018 1816   LABSPEC 1.020 10/25/2018 1816  PHURINE 5.5 10/25/2018 1816   GLUCOSEU NEGATIVE 10/25/2018 1816   HGBUR NEGATIVE 10/25/2018 1816   BILIRUBINUR NEGATIVE 10/25/2018 1816   KETONESUR NEGATIVE 10/25/2018 1816   PROTEINUR NEGATIVE 10/25/2018 1816   NITRITE NEGATIVE 10/25/2018 1816   LEUKOCYTESUR NEGATIVE 10/25/2018 1816   Sepsis Labs: Invalid input(s): PROCALCITONIN, LACTICIDVEN  Recent Results (from the past 240 hour(s))  Aerobic/Anaerobic Culture (surgical/deep wound)     Status: None (Preliminary result)   Collection Time: 11/02/18  4:42 PM  Result Value Ref Range Status   Specimen Description   Final    ABSCESS Performed at [facility], [address] [place], [place] [address]    Special Requests INTRA ABDOMINAL  Final   Gram Stain   Final    ABUNDANT WBC PRESENT,  PREDOMINANTLY PMN ABUNDANT GRAM POSITIVE COCCI Performed at [facility], [address], [place], [place] [address]    Culture PENDING  Incomplete   Report Status PENDING  Incomplete      Radiology Studies: Mr Abdomen W Wo Contrast  Result Date: 11/02/2018 CLINICAL DATA:  Evaluate for malignancy. EXAM: MRI ABDOMEN WITHOUT AND WITH CONTRAST TECHNIQUE: Multiplanar multisequence MR imaging of the abdomen was performed both before and after the administration of intravenous contrast. CONTRAST:  5 cc of Gadavist. COMPARISON:  10/25/2018 FINDINGS: Lower chest: Small right pleural effusion is new from previous exam. Hepatobiliary: Mild hepatic steatosis. No focal enhancing liver lesions identified. The gallbladder appears normal. The common bile duct measures 6 mm in maximum dimension. Pancreas: Normal appearance of the pancreas. No inflammation mass or main duct dilatation. Spleen:  Within normal limits in size and appearance. Adrenals/Urinary Tract: Normal appearance of the adrenal glands. Small 8 mm cyst noted within the left kidney. No enhancing mass or hydronephrosis. Stomach/Bowel: Masslike thickening of the proximal wall of stomach is identified. This measures approximately 8.5 cm in length and has a thickness of 3.1 cm. This is nonspecific and although this may be due to gastritis underlying mass is not excluded. Persistent abnormal dilatation of small-bowel loops is again identified measuring up to 3.8 cm in diameter. Gaseous distension of the colon is noted as well. Vascular/Lymphatic: Normal appearance of the abdominal aorta. No aneurysm. No definitive adenopathy noted at this time. Other: There are multiple large peripherally enhancing fluid collections identified within both sides of the abdomen. Within the left abdomen fluid collection measures 15.1 by 8.3 by 21.1 cm. In the right hemiabdomen there is a large fluid collection measuring 8.3 x 14.8 by 21.0 cm. These appear more well defined  with progressive mural enhancement compatible with mature fluid collections. There is progressive mass effect along the dome of liver, image 19/903. Continued foci of pneumoperitoneum also noted, for example overlying anterior left lobe of liver, image 32/901. Musculoskeletal: No suspicious bone lesions identified. IMPRESSION: 1. Large bilateral abdominal fluid collections are identified on today's exam. These appear increased in size with peripheral enhancement compatible with maturing fluid collections. There is also continued pneumoperitoneum. Findings are compatible with perforated viscus. Suspect multiple intra-abdominal abscesses. 2. There is nonspecific, masslike wall thickening involving the proximal stomach. Findings may be due to gastritis. Underlying mass would be difficult to exclude and correlation with endoscopic inspection should be considered. 3. Persistent abnormal distension of small bowel loops. There is also gaseous distension of the colon. In the setting of perforated viscus with multiple intra-abdominal fluid collections assist a nonspecific finding and may reflect inflammatory ileus. Bowel obstruction can up be excluded. 4. New small  right pleural effusion. Electronically Signed   By: Signa Kell M.D.   On: 11/02/2018 15:03   Ir US Guide Bx Asp/drain  Result Date: 11/02/2018 INDICATION: Bilateral intra-abdominal peritoneal fluid collections EXAM: Ultrasound bilateral abdominal flank abscess drains MEDICATIONS: The patient is currently admitted to the hospital and receiving intravenous antibiotics. The antibiotics were administered within an appropriate time frame prior to the initiation of the procedure. ANESTHESIA/SEDATION: Fentanyl 100 mcg IV; Versed 2.0 mg IV Moderate Sedation Time:  13 minutes The patient was continuously monitored during the procedure by the interventional radiology nurse under my direct supervision. COMPLICATIONS: None. PROCEDURE: Informed written consent was  obtained from the patient after a thorough discussion of the procedural risks, benefits and alternatives. All questions were addressed. Maximal Sterile Barrier Technique was utilized including caps, mask, sterile gowns, sterile gloves, sterile drape, hand hygiene and skin antiseptic. A timeout was performed prior to the initiation of the procedure. Previous imaging reviewed. Ultrasound performed. The complex bilateral flank abdominal fluid collection or localized and marked. Under sterile conditions and local anesthesia, ultrasound needle access performed of the bilateral flank abdominal fluid collections with 18 gauge 15 cm needles. Needle positions confirmed with ultrasound. Images obtained for documentation. Amplatz guidewires inserted followed by bilateral 10 French drains. Drain catheter positions confirmed with ultrasound. Syringe aspiration yielded purulent fluid bilaterally compatible with peritonitis and intra-abdominal pus. Catheter secured with Prolene sutures and connected to external gravity drainage bags. Approximately 1 L of purulent fluid drained from each catheter. IMPRESSION: Successful ultrasound bilateral abdominal flank abscess drain placements. Purulent fluid removed. Sample sent for culture and cytology. Electronically Signed   By: Judie Petit.  Shick M.D.   On: 11/02/2018 16:52   Ir US Guide Bx Asp/drain  Result Date: 11/02/2018 INDICATION: Bilateral intra-abdominal peritoneal fluid collections EXAM: Ultrasound bilateral abdominal flank abscess drains MEDICATIONS: The patient is currently admitted to the hospital and receiving intravenous antibiotics. The antibiotics were administered within an appropriate time frame prior to the initiation of the procedure. ANESTHESIA/SEDATION: Fentanyl 100 mcg IV; Versed 2.0 mg IV Moderate Sedation Time:  13 minutes The patient was continuously monitored during the procedure by the interventional radiology nurse under my direct supervision. COMPLICATIONS: None.  PROCEDURE: Informed written consent was obtained from the patient after a thorough discussion of the procedural risks, benefits and alternatives. All questions were addressed. Maximal Sterile Barrier Technique was utilized including caps, mask, sterile gowns, sterile gloves, sterile drape, hand hygiene and skin antiseptic. A timeout was performed prior to the initiation of the procedure. Previous imaging reviewed. Ultrasound performed. The complex bilateral flank abdominal fluid collection or localized and marked. Under sterile conditions and local anesthesia, ultrasound needle access performed of the bilateral flank abdominal fluid collections with 18 gauge 15 cm needles. Needle positions confirmed with ultrasound. Images obtained for documentation. Amplatz guidewires inserted followed by bilateral 10 French drains. Drain catheter positions confirmed with ultrasound. Syringe aspiration yielded purulent fluid bilaterally compatible with peritonitis and intra-abdominal pus. Catheter secured with Prolene sutures and connected to external gravity drainage bags. Approximately 1 L of purulent fluid drained from each catheter. IMPRESSION: Successful ultrasound bilateral abdominal flank abscess drain placements. Purulent fluid removed. Sample sent for culture and cytology. Electronically Signed   By: Judie Petit.  Shick M.D.   On: 11/02/2018 16:52    Rutha Melgoza T. [facility] Pager [phone]  If 7PM-7AM, please contact night-coverage [email or web address] Password [facility] 11/03/2018, 9:44 AM

## 2018-11-03 NOTE — Consult Note (Addendum)
Referring Provider: Dr. Alanda Slim Primary Care Physician:  Patient, No Pcp Per Primary Gastroenterologist:  Gentry Fitz  Reason for Consultation:   Gastric ulcer  HPI: Vincent Park is a 63 y.o. male with a history of a perforated ulcer in 2003 who has been managed this admission for pneumoperitoneum and intra-abdominal abscesses with an SBO. NGT decompression resolved SBO. UGIS showed a proximal gastric ulcer with irregular gastric wall thickening and persistent free air surrounding the ulcer site. No leak seen on UGIS. Abd MRI yesterday showed proximal stomach wall thickening and large bilateral abdominal fluid collections. S/P drain placement of abdominal flank abscesses by IR yesterday. Patient denies abdominal pain, nausea, or vomiting. 400 cc of clear bilious fluid from NG in last 12 hours. Denies previous alcohol abuse. Reports losing 15 pounds since 2018.  Past Medical History:  Diagnosis Date  . GERD (gastroesophageal reflux disease)   . Gonorrhea 2009  . Hematuria 2009  . Peptic ulcer 2003   ?ul[place] surgery in [place]?  Marland Kitchen UTI (urinary tract infection) 2009    Past Surgical History:  Procedure Laterality Date  . IR US GUIDE BX ASP/DRAIN  11/02/2018  . IR US GUIDE BX ASP/DRAIN  11/02/2018  . REPAIR OF PERFORATED ULCER  [place], [place] ?    Prior to Admission medications   Medication Sig Start Date End Date Taking? Authorizing Provider  Alum & Mag Hydroxide-Simeth (GI COCKTAIL) SUSP suspension Take 30 mLs by mouth 2 (two) times daily. Shake well. 10/10/18  Yes Gwyneth Sprout, MD  omeprazole (PRILOSEC) 20 MG capsule Take 1 capsule (20 mg total) by mouth daily. 10/10/18  Yes Gwyneth Sprout, MD    Scheduled Meds: . heparin injection (subcutaneous)  5,000 Units Subcutaneous Q8H  . insulin aspart  0-9 Units Subcutaneous Q6H  . pantoprazole (PROTONIX) IV  40 mg Intravenous Q12H  . sodium chloride flush  10-40 mL Intracatheter Q12H  . sodium chloride flush  5 mL Intracatheter Q8H    Continuous Infusions: . sodium chloride 50 mL/hr at 11/03/18 0727  . piperacillin-tazobactam (ZOSYN)  IV 3.375 g (11/03/18 0511)  . TPN ADULT (ION) 80 mL/hr at 11/02/18 1849  . TPN ADULT (ION)     PRN Meds:.sodium chloride flush  Allergies as of 10/25/2018  . (No Known Allergies)    History reviewed. No pertinent family history.  Social History   Socioeconomic History  . Marital status: Single    Spouse name: Not on file  . Number of children: Not on file  . Years of education: Not on file  . Highest education level: Not on file  Occupational History  . Not on file  Social Needs  . Financial resource strain: Hard  . Food insecurity:    Worry: Not on file    Inability: Not on file  . Transportation needs:    Medical: Yes    Non-medical: Not on file  Tobacco Use  . Smoking status: Current Every Day Smoker    Packs/day: 0.50    Types: Cigars, Cigarettes  . Smokeless tobacco: Never Used  Substance and Sexual Activity  . Alcohol use: Yes    Comment: 3-6 cans/week  . Drug use: Yes    Types: Marijuana  . Sexual activity: Not on file  Lifestyle  . Physical activity:    Days per week: Not on file    Minutes per session: Not on file  . Stress: Not on file  Relationships  . Social connections:    Talks on phone: Not  on file    Gets together: Not on file    Attends religious service: Not on file    Active member of club or organization: Not on file    Attends meetings of clubs or organizations: Not on file    Relationship status: Not on file  . Intimate partner violence:    Fear of current or ex partner: Not on file    Emotionally abused: Not on file    Physically abused: Not on file    Forced sexual activity: Not on file  Other Topics Concern  . Not on file  Social History Narrative   Originally from [place]   Lived in [place] ~ 2003   Lived in [place] since 2007    Review of Systems: All negative except as stated above in HPI.  Physical  Exam: Vital signs: Vitals:   11/03/18 0559 11/03/18 1018  BP: 138/68 (!) 135/94  Pulse: 79 79  Resp: 20 (!) 28  Temp: 98.2 F (36.8 C) 98 F (36.7 C)  SpO2: 99% 97%   Last BM Date: 11/01/18 General:   Cachetic, chronically ill-appearing, lethargic, no acute distress, NG in place Head: normocephalic, atraumatic Eyes: anicteric sclera ENT: NG in place, oropharynx clear Neck: thin, nontender Lungs:  Clear throughout to auscultation.   No wheezes, crackles, or rhonchi. No acute distress. Heart:  Regular rate and rhythm; no murmurs, clicks, rubs,  or gallops. Abdomen: flat, nontender, nondistended, drain sites bandaged, +BS  Rectal:  Deferred Ext: no edema  GI:  Lab Results: Recent Labs    11/03/18 0408  WBC 3.4*  HGB 9.5*  HCT 29.8*  PLT 308   BMET Recent Labs    11/01/18 0400 11/03/18 0408  NA 135 135  K 4.2 4.1  CL 104 101  CO2 27 28  GLUCOSE 110* 116*  BUN 18 20  CREATININE 0.74 0.81  CALCIUM 7.8* 7.9*   LFT Recent Labs    11/01/18 0400  PROT 6.5  ALBUMIN 1.7*  AST 29  ALT 24  ALKPHOS 124  BILITOT 0.8   PT/INR No results for input(s): LABPROT, INR in the last 72 hours.   Studies/Results: Dg Abd 1 View  Result Date: 11/03/2018 CLINICAL DATA:  Encounter for nasogastric tube placement. EXAM: ABDOMEN - 1 VIEW COMPARISON:  10/26/2018 FINDINGS: Nasogastric tube in the upper abdomen. Catheter tip is likely in the proximal stomach. The tube side hole is at the GE junction. Bilateral abdominal pigtail drains. Pockets of gas in the left upper abdomen appear to represent persistent free air. Elevation of the right hemidiaphragm with probable right basilar atelectasis. IMPRESSION: Tip of the nasogastric tube is in the proximal stomach region. The side-hole is near the GE junction. Bilateral abdominal pigtail drains. Persistent free air in the left upper quadrant of the abdomen. Electronically Signed   By: Richarda Overlie M.D.   On: 11/03/2018 11:20   Mr Abdomen W Wo  Contrast  Result Date: 11/02/2018 CLINICAL DATA:  Evaluate for malignancy. EXAM: MRI ABDOMEN WITHOUT AND WITH CONTRAST TECHNIQUE: Multiplanar multisequence MR imaging of the abdomen was performed both before and after the administration of intravenous contrast. CONTRAST:  5 cc of Gadavist. COMPARISON:  10/25/2018 FINDINGS: Lower chest: Small right pleural effusion is new from previous exam. Hepatobiliary: Mild hepatic steatosis. No focal enhancing liver lesions identified. The gallbladder appears normal. The common bile duct measures 6 mm in maximum dimension. Pancreas: Normal appearance of the pancreas. No inflammation mass or main duct dilatation.  Spleen:  Within normal limits in size and appearance. Adrenals/Urinary Tract: Normal appearance of the adrenal glands. Small 8 mm cyst noted within the left kidney. No enhancing mass or hydronephrosis. Stomach/Bowel: Masslike thickening of the proximal wall of stomach is identified. This measures approximately 8.5 cm in length and has a thickness of 3.1 cm. This is nonspecific and although this may be due to gastritis underlying mass is not excluded. Persistent abnormal dilatation of small-bowel loops is again identified measuring up to 3.8 cm in diameter. Gaseous distension of the colon is noted as well. Vascular/Lymphatic: Normal appearance of the abdominal aorta. No aneurysm. No definitive adenopathy noted at this time. Other: There are multiple large peripherally enhancing fluid collections identified within both sides of the abdomen. Within the left abdomen fluid collection measures 15.1 by 8.3 by 21.1 cm. In the right hemiabdomen there is a large fluid collection measuring 8.3 x 14.8 by 21.0 cm. These appear more well defined with progressive mural enhancement compatible with mature fluid collections. There is progressive mass effect along the dome of liver, image 19/903. Continued foci of pneumoperitoneum also noted, for example overlying anterior left lobe of  liver, image 32/901. Musculoskeletal: No suspicious bone lesions identified. IMPRESSION: 1. Large bilateral abdominal fluid collections are identified on today's exam. These appear increased in size with peripheral enhancement compatible with maturing fluid collections. There is also continued pneumoperitoneum. Findings are compatible with perforated viscus. Suspect multiple intra-abdominal abscesses. 2. There is nonspecific, masslike wall thickening involving the proximal stomach. Findings may be due to gastritis. Underlying mass would be difficult to exclude and correlation with endoscopic inspection should be considered. 3. Persistent abnormal distension of small bowel loops. There is also gaseous distension of the colon. In the setting of perforated viscus with multiple intra-abdominal fluid collections assist a nonspecific finding and may reflect inflammatory ileus. Bowel obstruction can up be excluded. 4. New small right pleural effusion. Electronically Signed   By: Signa Kell M.D.   On: 11/02/2018 15:03   Ir US Guide Bx Asp/drain  Result Date: 11/02/2018 INDICATION: Bilateral intra-abdominal peritoneal fluid collections EXAM: Ultrasound bilateral abdominal flank abscess drains MEDICATIONS: The patient is currently admitted to the hospital and receiving intravenous antibiotics. The antibiotics were administered within an appropriate time frame prior to the initiation of the procedure. ANESTHESIA/SEDATION: Fentanyl 100 mcg IV; Versed 2.0 mg IV Moderate Sedation Time:  13 minutes The patient was continuously monitored during the procedure by the interventional radiology nurse under my direct supervision. COMPLICATIONS: None. PROCEDURE: Informed written consent was obtained from the patient after a thorough discussion of the procedural risks, benefits and alternatives. All questions were addressed. Maximal Sterile Barrier Technique was utilized including caps, mask, sterile gowns, sterile gloves, sterile  drape, hand hygiene and skin antiseptic. A timeout was performed prior to the initiation of the procedure. Previous imaging reviewed. Ultrasound performed. The complex bilateral flank abdominal fluid collection or localized and marked. Under sterile conditions and local anesthesia, ultrasound needle access performed of the bilateral flank abdominal fluid collections with 18 gauge 15 cm needles. Needle positions confirmed with ultrasound. Images obtained for documentation. Amplatz guidewires inserted followed by bilateral 10 French drains. Drain catheter positions confirmed with ultrasound. Syringe aspiration yielded purulent fluid bilaterally compatible with peritonitis and intra-abdominal pus. Catheter secured with Prolene sutures and connected to external gravity drainage bags. Approximately 1 L of purulent fluid drained from each catheter. IMPRESSION: Successful ultrasound bilateral abdominal flank abscess drain placements. Purulent fluid removed. Sample sent for culture and cytology.  Electronically Signed   By: Judie Petit.  Shick M.D.   On: 11/02/2018 16:52   Ir US Guide Bx Asp/drain  Result Date: 11/02/2018 INDICATION: Bilateral intra-abdominal peritoneal fluid collections EXAM: Ultrasound bilateral abdominal flank abscess drains MEDICATIONS: The patient is currently admitted to the hospital and receiving intravenous antibiotics. The antibiotics were administered within an appropriate time frame prior to the initiation of the procedure. ANESTHESIA/SEDATION: Fentanyl 100 mcg IV; Versed 2.0 mg IV Moderate Sedation Time:  13 minutes The patient was continuously monitored during the procedure by the interventional radiology nurse under my direct supervision. COMPLICATIONS: None. PROCEDURE: Informed written consent was obtained from the patient after a thorough discussion of the procedural risks, benefits and alternatives. All questions were addressed. Maximal Sterile Barrier Technique was utilized including caps, mask,  sterile gowns, sterile gloves, sterile drape, hand hygiene and skin antiseptic. A timeout was performed prior to the initiation of the procedure. Previous imaging reviewed. Ultrasound performed. The complex bilateral flank abdominal fluid collection or localized and marked. Under sterile conditions and local anesthesia, ultrasound needle access performed of the bilateral flank abdominal fluid collections with 18 gauge 15 cm needles. Needle positions confirmed with ultrasound. Images obtained for documentation. Amplatz guidewires inserted followed by bilateral 10 French drains. Drain catheter positions confirmed with ultrasound. Syringe aspiration yielded purulent fluid bilaterally compatible with peritonitis and intra-abdominal pus. Catheter secured with Prolene sutures and connected to external gravity drainage bags. Approximately 1 L of purulent fluid drained from each catheter. IMPRESSION: Successful ultrasound bilateral abdominal flank abscess drain placements. Purulent fluid removed. Sample sent for culture and cytology. Electronically Signed   By: Judie Petit.  Shick M.D.   On: 11/02/2018 16:52    Impression/Plan: Several imaging studies suggesting a proximal stomach ulcerated mass with evidence of perforation from this area. 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 I would NOT recommend an EGD due to risk of extending the perforation from insufflation and based on the imaging studies I have seen and reviewed and think he needs exploratory surgery. Continue IV Abx, percutaneous drains, and supportive care. Will follow.    LOS: 9 days   [name]  11/03/2018, 12:02 PM  Questions please call [phone]

## 2018-11-03 NOTE — Progress Notes (Signed)
Subjective/Chief Complaint: No complaints   Objective: Vital signs in last 24 hours: Temp:  [97.8 F (36.6 C)-98.7 F (37.1 C)] 98.2 F (36.8 C) (02/22 0559) Pulse Rate:  [79-96] 79 (02/22 0559) Resp:  [11-29] 20 (02/22 0559) BP: (121-160)/(68-88) 138/68 (02/22 0559) SpO2:  [93 %-100 %] 99 % (02/22 0559) Weight:  [57.1 kg] 57.1 kg (02/21 1006) Last BM Date: 11/01/18  Intake/Output from previous day: 02/21 0701 - 02/22 0700 In: 2399.3 [P.O.:240; I.V.:2118.1; IV Piggyback:41.2] Out: 2270 [Urine:1325; Emesis/NG output:660; Drains:285] Intake/Output this shift: Total I/O In: -  Out: 190 [Urine:190]  General appearance: alert, cooperative and cachectic Resp: clear to auscultation bilaterally Cardio: regular rate and rhythm GI: soft, nontender. drain output pus  Lab Results:  Recent Labs    11/03/18 0408  WBC 3.4*  HGB 9.5*  HCT 29.8*  PLT 308   BMET Recent Labs    11/01/18 0400 11/03/18 0408  NA 135 135  K 4.2 4.1  CL 104 101  CO2 27 28  GLUCOSE 110* 116*  BUN 18 20  CREATININE 0.74 0.81  CALCIUM 7.8* 7.9*   PT/INR No results for input(s): LABPROT, INR in the last 72 hours. ABG No results for input(s): PHART, HCO3 in the last 72 hours.  Invalid input(s): PCO2, PO2  Studies/Results: Mr Abdomen W Wo Contrast  Result Date: 11/02/2018 CLINICAL DATA:  Evaluate for malignancy. EXAM: MRI ABDOMEN WITHOUT AND WITH CONTRAST TECHNIQUE: Multiplanar multisequence MR imaging of the abdomen was performed both before and after the administration of intravenous contrast. CONTRAST:  5 cc of Gadavist. COMPARISON:  10/25/2018 FINDINGS: Lower chest: Small right pleural effusion is new from previous exam. Hepatobiliary: Mild hepatic steatosis. No focal enhancing liver lesions identified. The gallbladder appears normal. The common bile duct measures 6 mm in maximum dimension. Pancreas: Normal appearance of the pancreas. No inflammation mass or main duct dilatation. Spleen:   Within normal limits in size and appearance. Adrenals/Urinary Tract: Normal appearance of the adrenal glands. Small 8 mm cyst noted within the left kidney. No enhancing mass or hydronephrosis. Stomach/Bowel: Masslike thickening of the proximal wall of stomach is identified. This measures approximately 8.5 cm in length and has a thickness of 3.1 cm. This is nonspecific and although this may be due to gastritis underlying mass is not excluded. Persistent abnormal dilatation of small-bowel loops is again identified measuring up to 3.8 cm in diameter. Gaseous distension of the colon is noted as well. Vascular/Lymphatic: Normal appearance of the abdominal aorta. No aneurysm. No definitive adenopathy noted at this time. Other: There are multiple large peripherally enhancing fluid collections identified within both sides of the abdomen. Within the left abdomen fluid collection measures 15.1 by 8.3 by 21.1 cm. In the right hemiabdomen there is a large fluid collection measuring 8.3 x 14.8 by 21.0 cm. These appear more well defined with progressive mural enhancement compatible with mature fluid collections. There is progressive mass effect along the dome of liver, image 19/903. Continued foci of pneumoperitoneum also noted, for example overlying anterior left lobe of liver, image 32/901. Musculoskeletal: No suspicious bone lesions identified. IMPRESSION: 1. Large bilateral abdominal fluid collections are identified on today's exam. These appear increased in size with peripheral enhancement compatible with maturing fluid collections. There is also continued pneumoperitoneum. Findings are compatible with perforated viscus. Suspect multiple intra-abdominal abscesses. 2. There is nonspecific, masslike wall thickening involving the proximal stomach. Findings may be due to gastritis. Underlying mass would be difficult to exclude and correlation with endoscopic inspection  should be considered. 3. Persistent abnormal distension of  small bowel loops. There is also gaseous distension of the colon. In the setting of perforated viscus with multiple intra-abdominal fluid collections assist a nonspecific finding and may reflect inflammatory ileus. Bowel obstruction can up be excluded. 4. New small right pleural effusion. Electronically Signed   By: Signa Kell M.D.   On: 11/02/2018 15:03   Ir US Guide Bx Asp/drain  Result Date: 11/02/2018 INDICATION: Bilateral intra-abdominal peritoneal fluid collections EXAM: Ultrasound bilateral abdominal flank abscess drains MEDICATIONS: The patient is currently admitted to the hospital and receiving intravenous antibiotics. The antibiotics were administered within an appropriate time frame prior to the initiation of the procedure. ANESTHESIA/SEDATION: Fentanyl 100 mcg IV; Versed 2.0 mg IV Moderate Sedation Time:  13 minutes The patient was continuously monitored during the procedure by the interventional radiology nurse under my direct supervision. COMPLICATIONS: None. PROCEDURE: Informed written consent was obtained from the patient after a thorough discussion of the procedural risks, benefits and alternatives. All questions were addressed. Maximal Sterile Barrier Technique was utilized including caps, mask, sterile gowns, sterile gloves, sterile drape, hand hygiene and skin antiseptic. A timeout was performed prior to the initiation of the procedure. Previous imaging reviewed. Ultrasound performed. The complex bilateral flank abdominal fluid collection or localized and marked. Under sterile conditions and local anesthesia, ultrasound needle access performed of the bilateral flank abdominal fluid collections with 18 gauge 15 cm needles. Needle positions confirmed with ultrasound. Images obtained for documentation. Amplatz guidewires inserted followed by bilateral 10 French drains. Drain catheter positions confirmed with ultrasound. Syringe aspiration yielded purulent fluid bilaterally compatible with  peritonitis and intra-abdominal pus. Catheter secured with Prolene sutures and connected to external gravity drainage bags. Approximately 1 L of purulent fluid drained from each catheter. IMPRESSION: Successful ultrasound bilateral abdominal flank abscess drain placements. Purulent fluid removed. Sample sent for culture and cytology. Electronically Signed   By: Judie Petit.  Shick M.D.   On: 11/02/2018 16:52   Ir US Guide Bx Asp/drain  Result Date: 11/02/2018 INDICATION: Bilateral intra-abdominal peritoneal fluid collections EXAM: Ultrasound bilateral abdominal flank abscess drains MEDICATIONS: The patient is currently admitted to the hospital and receiving intravenous antibiotics. The antibiotics were administered within an appropriate time frame prior to the initiation of the procedure. ANESTHESIA/SEDATION: Fentanyl 100 mcg IV; Versed 2.0 mg IV Moderate Sedation Time:  13 minutes The patient was continuously monitored during the procedure by the interventional radiology nurse under my direct supervision. COMPLICATIONS: None. PROCEDURE: Informed written consent was obtained from the patient after a thorough discussion of the procedural risks, benefits and alternatives. All questions were addressed. Maximal Sterile Barrier Technique was utilized including caps, mask, sterile gowns, sterile gloves, sterile drape, hand hygiene and skin antiseptic. A timeout was performed prior to the initiation of the procedure. Previous imaging reviewed. Ultrasound performed. The complex bilateral flank abdominal fluid collection or localized and marked. Under sterile conditions and local anesthesia, ultrasound needle access performed of the bilateral flank abdominal fluid collections with 18 gauge 15 cm needles. Needle positions confirmed with ultrasound. Images obtained for documentation. Amplatz guidewires inserted followed by bilateral 10 French drains. Drain catheter positions confirmed with ultrasound. Syringe aspiration yielded  purulent fluid bilaterally compatible with peritonitis and intra-abdominal pus. Catheter secured with Prolene sutures and connected to external gravity drainage bags. Approximately 1 L of purulent fluid drained from each catheter. IMPRESSION: Successful ultrasound bilateral abdominal flank abscess drain placements. Purulent fluid removed. Sample sent for culture and cytology. Electronically Signed  By: Osvaldo Shipper M.D.   On: 11/02/2018 16:52    Anti-infectives: Anti-infectives (From admission, onward)   Start     Dose/Rate Route Frequency Ordered Stop   10/26/18 0400  piperacillin-tazobactam (ZOSYN) IVPB 3.375 g     3.375 g 12.5 mL/hr over 240 Minutes Intravenous Every 8 hours 10/26/18 0245     10/25/18 1930  piperacillin-tazobactam (ZOSYN) IVPB 3.375 g     3.375 g 100 mL/hr over 30 Minutes Intravenous  Once 10/25/18 1928 10/25/18 2021      Assessment/Plan: s/p Procedure(s): EXPLORATORY  LAPAROSCOPY POSSIBLE LAPAROTOMY (N/A) Continue perc drains. Gram + cocci Continue IV Zosyn Continue ng and bowel rest. Check KUB for ng placement TPN for nutrition support  LOS: 9 days    Vincent Park 11/03/2018

## 2018-11-03 NOTE — Progress Notes (Signed)
PHARMACY - ADULT TOTAL PARENTERAL NUTRITION CONSULT NOTE   Pharmacy Consult for TPN Indication: bowel rest, severe malnutrition PTA  Patient Measurements: Height: 6' 2"  (188 cm) Weight: 125 lb 14.1 oz (57.1 kg) IBW/kg (Calculated) : 82.2 TPN AdjBW (KG): 54 Body mass index is 16.16 kg/m. Usual Weight: unknown as patient is poor historian, but reports significant weight loss over the past year  HPI: 42 yoM with PMH PUD with perforated gastric ulcer s/p repair in 2003, GERD presents with worsening chronic abdominal pain and associated weight loss. Cachectic appearing. CT reveals ascites with concern for SBO vs recurrent ulcer disease. Pt is NPO. Consulted by surgery for TPN management. Pt will require nutritional support prior to any surgical intervention.  Central access: Double lumen PICC placed 2/14 TPN start date: 2/14  Significant events:   ASSESSMENT                                                                                               Insuline requirements:   Current Nutrition: NPO except for ice chips  IVF: NS at 50 mL/hr  Today, 11/03/2018:  Glucose (CBG goal 100-150) - CBGs continue be at goal and stable  Electrolytes - WNL stable  Renal - Mild AKI on admission - resolved. SCr now WNL  Bicarb normal  LFTs, Alk Phos - WNL. Albumin (1.7) low  TGs - WNL (120, 2/15), (117, 2/17)   Prealbumin - <5 (low, chronic malnutrition)  NUTRITIONAL GOALS                                                                                 RD recs (per note on 2/14): Kcal: 1900 - 2100 grams Protein: 95 - 115 g Fluid: >/= 1.9 L/day  Custom TPN at goal rate of 80 mL/hr will provide: 2070 kcal, 105 g protein, and 326 g dextrose, meeting 100% of patient needs    PLAN                       At 1800 today:  Continue TPN at goal rate of 80 mL/hr providing 100 % of patient needs with 2070 kcal and 105 g protein  Continue with standard electrolytes with increase Mg slightly to  7.5 meq/L. Cl:Ac 1:2.   continue IVF to 50 mL/hr  Continue sensitive SSI with q6 hr CBG checks  Check BMet with mag and phos in am  TPN lab panels on Mondays & Thursdays    Adrian Saran, PharmD, BCPS 11/03/2018 9:10 AM

## 2018-11-03 NOTE — Progress Notes (Signed)
I have reviewed and concur with this student's documentation.   

## 2018-11-03 NOTE — Progress Notes (Signed)
Referring Physician(s): Dr Ludwig Lean  Supervising Physician: Jolaine Click  Patient Status:  [facility] - In-pt  Chief Complaint:  Bilat intra abdominal abscess drains Placed 2/21  Subjective:  Some better today Both drains OP of yellow milky fluid Gr+Cocci   Allergies: Patient has no known allergies.  Medications: Prior to Admission medications   Medication Sig Start Date End Date Taking? Authorizing Provider  Alum & Mag Hydroxide-Simeth (GI COCKTAIL) SUSP suspension Take 30 mLs by mouth 2 (two) times daily. Shake well. 10/10/18  Yes Gwyneth Sprout, MD  omeprazole (PRILOSEC) 20 MG capsule Take 1 capsule (20 mg total) by mouth daily. 10/10/18  Yes Gwyneth Sprout, MD     Vital Signs: BP (!) 135/94 (BP Location: Right Arm)   Pulse 79   Temp 98 F (36.7 C) (Oral)   Resp (!) 28   Ht 6\' 2"  (1.88 m)   Wt 125 lb 14.1 oz (57.1 kg)   SpO2 97%   BMI 16.16 kg/m   Physical Exam Vitals signs reviewed.  Abdominal:     General: Bowel sounds are normal.  Skin:    General: Skin is warm and dry.     Comments: Sites are clean and dry NT no bleeding OP milky yellow Left 125 cc so far R 250 cc so far  Gr+ cocci   Neurological:     Mental Status: He is alert.     Imaging: Mr Abdomen W Wo Contrast  Result Date: 11/02/2018 CLINICAL DATA:  Evaluate for malignancy. EXAM: MRI ABDOMEN WITHOUT AND WITH CONTRAST TECHNIQUE: Multiplanar multisequence MR imaging of the abdomen was performed both before and after the administration of intravenous contrast. CONTRAST:  5 cc of Gadavist. COMPARISON:  10/25/2018 FINDINGS: Lower chest: Small right pleural effusion is new from previous exam. Hepatobiliary: Mild hepatic steatosis. No focal enhancing liver lesions identified. The gallbladder appears normal. The common bile duct measures 6 mm in maximum dimension. Pancreas: Normal appearance of the pancreas. No inflammation mass or main duct dilatation. Spleen:  Within normal limits in size and  appearance. Adrenals/Urinary Tract: Normal appearance of the adrenal glands. Small 8 mm cyst noted within the left kidney. No enhancing mass or hydronephrosis. Stomach/Bowel: Masslike thickening of the proximal wall of stomach is identified. This measures approximately 8.5 cm in length and has a thickness of 3.1 cm. This is nonspecific and although this may be due to gastritis underlying mass is not excluded. Persistent abnormal dilatation of small-bowel loops is again identified measuring up to 3.8 cm in diameter. Gaseous distension of the colon is noted as well. Vascular/Lymphatic: Normal appearance of the abdominal aorta. No aneurysm. No definitive adenopathy noted at this time. Other: There are multiple large peripherally enhancing fluid collections identified within both sides of the abdomen. Within the left abdomen fluid collection measures 15.1 by 8.3 by 21.1 cm. In the right hemiabdomen there is a large fluid collection measuring 8.3 x 14.8 by 21.0 cm. These appear more well defined with progressive mural enhancement compatible with mature fluid collections. There is progressive mass effect along the dome of liver, image 19/903. Continued foci of pneumoperitoneum also noted, for example overlying anterior left lobe of liver, image 32/901. Musculoskeletal: No suspicious bone lesions identified. IMPRESSION: 1. Large bilateral abdominal fluid collections are identified on today's exam. These appear increased in size with peripheral enhancement compatible with maturing fluid collections. There is also continued pneumoperitoneum. Findings are compatible with perforated viscus. Suspect multiple intra-abdominal abscesses. 2. There is nonspecific, masslike wall thickening  involving the proximal stomach. Findings may be due to gastritis. Underlying mass would be difficult to exclude and correlation with endoscopic inspection should be considered. 3. Persistent abnormal distension of small bowel loops. There is also  gaseous distension of the colon. In the setting of perforated viscus with multiple intra-abdominal fluid collections assist a nonspecific finding and may reflect inflammatory ileus. Bowel obstruction can up be excluded. 4. New small right pleural effusion. Electronically Signed   By: Signa Kell M.D.   On: 11/02/2018 15:03   Ir US Guide Bx Asp/drain  Result Date: 11/02/2018 INDICATION: Bilateral intra-abdominal peritoneal fluid collections EXAM: Ultrasound bilateral abdominal flank abscess drains MEDICATIONS: The patient is currently admitted to the hospital and receiving intravenous antibiotics. The antibiotics were administered within an appropriate time frame prior to the initiation of the procedure. ANESTHESIA/SEDATION: Fentanyl 100 mcg IV; Versed 2.0 mg IV Moderate Sedation Time:  13 minutes The patient was continuously monitored during the procedure by the interventional radiology nurse under my direct supervision. COMPLICATIONS: None. PROCEDURE: Informed written consent was obtained from the patient after a thorough discussion of the procedural risks, benefits and alternatives. All questions were addressed. Maximal Sterile Barrier Technique was utilized including caps, mask, sterile gowns, sterile gloves, sterile drape, hand hygiene and skin antiseptic. A timeout was performed prior to the initiation of the procedure. Previous imaging reviewed. Ultrasound performed. The complex bilateral flank abdominal fluid collection or localized and marked. Under sterile conditions and local anesthesia, ultrasound needle access performed of the bilateral flank abdominal fluid collections with 18 gauge 15 cm needles. Needle positions confirmed with ultrasound. Images obtained for documentation. Amplatz guidewires inserted followed by bilateral 10 French drains. Drain catheter positions confirmed with ultrasound. Syringe aspiration yielded purulent fluid bilaterally compatible with peritonitis and intra-abdominal  pus. Catheter secured with Prolene sutures and connected to external gravity drainage bags. Approximately 1 L of purulent fluid drained from each catheter. IMPRESSION: Successful ultrasound bilateral abdominal flank abscess drain placements. Purulent fluid removed. Sample sent for culture and cytology. Electronically Signed   By: Judie Petit.  Shick M.D.   On: 11/02/2018 16:52   Ir US Guide Bx Asp/drain  Result Date: 11/02/2018 INDICATION: Bilateral intra-abdominal peritoneal fluid collections EXAM: Ultrasound bilateral abdominal flank abscess drains MEDICATIONS: The patient is currently admitted to the hospital and receiving intravenous antibiotics. The antibiotics were administered within an appropriate time frame prior to the initiation of the procedure. ANESTHESIA/SEDATION: Fentanyl 100 mcg IV; Versed 2.0 mg IV Moderate Sedation Time:  13 minutes The patient was continuously monitored during the procedure by the interventional radiology nurse under my direct supervision. COMPLICATIONS: None. PROCEDURE: Informed written consent was obtained from the patient after a thorough discussion of the procedural risks, benefits and alternatives. All questions were addressed. Maximal Sterile Barrier Technique was utilized including caps, mask, sterile gowns, sterile gloves, sterile drape, hand hygiene and skin antiseptic. A timeout was performed prior to the initiation of the procedure. Previous imaging reviewed. Ultrasound performed. The complex bilateral flank abdominal fluid collection or localized and marked. Under sterile conditions and local anesthesia, ultrasound needle access performed of the bilateral flank abdominal fluid collections with 18 gauge 15 cm needles. Needle positions confirmed with ultrasound. Images obtained for documentation. Amplatz guidewires inserted followed by bilateral 10 French drains. Drain catheter positions confirmed with ultrasound. Syringe aspiration yielded purulent fluid bilaterally  compatible with peritonitis and intra-abdominal pus. Catheter secured with Prolene sutures and connected to external gravity drainage bags. Approximately 1 L of purulent fluid drained from each catheter.  IMPRESSION: Successful ultrasound bilateral abdominal flank abscess drain placements. Purulent fluid removed. Sample sent for culture and cytology. Electronically Signed   By: Judie Petit.  Shick M.D.   On: 11/02/2018 16:52   Dg Kayleen Memos W Single Cm (sol Or Thin Ba)  Result Date: 10/31/2018 CLINICAL DATA:  63 year old male inpatient with remote history of repair of perforated gastric ulcer, presents with pneumoperitoneum and clinical concern for perforated gastric ulcer or gastric outlet obstruction. EXAM: WATER SOLUBLE UPPER GI SERIES TECHNIQUE: Single-column upper GI series was performed using water soluble contrast. CONTRAST:  Water-soluble oral contrast. COMPARISON:  10/26/2018 abdominal radiographs and 10/25/2018 CT abdomen/pelvis. FLUOROSCOPY TIME:  Fluoroscopy Time:  2 minutes 6 seconds Radiation Exposure Index (if provided by the fluoroscopic device): 33.9 mGy Number of Acquired Spot Images: 6 FINDINGS: Enteric tube terminates in proximal stomach with side port at the esophagogastric junction. Free intraperitoneal air is noted in the left upper quadrant on the scout radiograph. Mildly dilated small bowel loops are noted in the central abdomen, similar to decreased from prior abdominal radiograph. Grossly normal esophagus on limited views. Oral contrast transits the stomach into the duodenum and proximal small bowel without delay. An ulcer is identified in the anterior proximal body of the stomach. No contrast leak from the stomach into the peritoneal cavity was elicited. There is irregular wall thickening in the anterior proximal gastric wall surrounding the ulcer site. There is narrowing of the body of the stomach. Incidentally noted small bowel malrotation, with the duodenal jejunal junction to the right of the  spine. Proximal small bowel loops are mildly dilated. IMPRESSION: 1. Gastric ulcer identified in the anterior proximal body of the stomach, with surrounding irregular gastric wall thickening, cannot exclude a malignant ulcer. Persistent free intraperitoneal air in the left upper quadrant surrounding the ulcer site. No water-soluble contrast leak from the stomach into the peritoneal cavity detected. 2. No evidence of gastric outlet obstruction. Nonspecific narrowing of the body of the stomach. 3. Incidental small bowel malrotation. Proximal small bowel dilatation is unchanged from recent CT and abdominal radiograph, probably due to ileus due to peritonitis. These results were called by telephone at the time of interpretation on 10/31/2018 at 11:41 am to Dr. Abigail Miyamoto , who verbally acknowledged these results. Electronically Signed   By: Delbert Phenix M.D.   On: 10/31/2018 12:00    Labs:  CBC: Recent Labs    10/26/18 0500 10/27/18 0542 10/29/18 0406 11/03/18 0408  WBC 8.5 6.1 4.9 3.4*  HGB 11.8* 10.3* 9.8* 9.5*  HCT 36.4* 31.6* 30.1* 29.8*  PLT 319 267 274 308    COAGS: Recent Labs    10/26/18 0500  INR 1.13    BMP: Recent Labs    10/30/18 0500 10/31/18 0443 11/01/18 0400 11/03/18 0408  NA 141 138 135 135  K 3.7 4.1 4.2 4.1  CL 110 107 104 101  CO2 24 26 27 28   GLUCOSE 107* 107* 110* 116*  BUN 18 17 18 20   CALCIUM 7.9* 7.8* 7.8* 7.9*  CREATININE 0.83 0.72 0.74 0.81  GFRNONAA >60 >60 >60 >60  GFRAA >60 >60 >60 >60    LIVER FUNCTION TESTS: Recent Labs    10/25/18 1721 10/27/18 0542 10/29/18 0406 11/01/18 0400  BILITOT 0.8 0.5 0.1* 0.8  AST 45* 39 34 29  ALT 34 29 27 24   ALKPHOS 134* 97 98 124  PROT 7.0 5.8* 5.9* 6.5  ALBUMIN 2.2* 1.7* 1.7* 1.7*    Assessment and Plan:  Bilat intra abd abscess  drains Intact Will follow  Electronically Signed: Robet Leu, PA-C 11/03/2018, 10:53 AM   I spent a total of 15 Minutes at the the patient's bedside AND  on the patient's hospital floor or unit, greater than 50% of which was counseling/coordinating care for Bila intra abd absc drains

## 2018-11-04 LAB — BASIC METABOLIC PANEL
Anion gap: 5 (ref 5–15)
BUN: 19 mg/dL (ref 8–23)
CO2: 28 mmol/L (ref 22–32)
Calcium: 8 mg/dL — ABNORMAL LOW (ref 8.9–10.3)
Chloride: 104 mmol/L (ref 98–111)
Creatinine, Ser: 0.67 mg/dL (ref 0.61–1.24)
Glucose, Bld: 117 mg/dL — ABNORMAL HIGH (ref 70–99)
POTASSIUM: 4 mmol/L (ref 3.5–5.1)
Sodium: 137 mmol/L (ref 135–145)

## 2018-11-04 LAB — CBC
HCT: 28.9 % — ABNORMAL LOW (ref 39.0–52.0)
Hemoglobin: 9.4 g/dL — ABNORMAL LOW (ref 13.0–17.0)
MCH: 33.8 pg (ref 26.0–34.0)
MCHC: 32.5 g/dL (ref 30.0–36.0)
MCV: 104 fL — ABNORMAL HIGH (ref 80.0–100.0)
Platelets: 303 10*3/uL (ref 150–400)
RBC: 2.78 MIL/uL — ABNORMAL LOW (ref 4.22–5.81)
RDW: 13.7 % (ref 11.5–15.5)
WBC: 4.5 10*3/uL (ref 4.0–10.5)
nRBC: 0 % (ref 0.0–0.2)

## 2018-11-04 LAB — GLUCOSE, CAPILLARY
Glucose-Capillary: 105 mg/dL — ABNORMAL HIGH (ref 70–99)
Glucose-Capillary: 107 mg/dL — ABNORMAL HIGH (ref 70–99)
Glucose-Capillary: 110 mg/dL — ABNORMAL HIGH (ref 70–99)
Glucose-Capillary: 116 mg/dL — ABNORMAL HIGH (ref 70–99)

## 2018-11-04 LAB — MAGNESIUM: Magnesium: 2 mg/dL (ref 1.7–2.4)

## 2018-11-04 LAB — PHOSPHORUS: PHOSPHORUS: 3.4 mg/dL (ref 2.5–4.6)

## 2018-11-04 MED ORDER — TRAVASOL 10 % IV SOLN
INTRAVENOUS | Status: AC
  Administered 2018-11-04: 17:00:00 via INTRAVENOUS
  Filled 2018-11-04: qty 1056

## 2018-11-04 NOTE — Progress Notes (Signed)
[facility] Gastroenterology Progress Note  [place] 63 y.o. Feb 04, 1956   Subjective: Denies abd pain. Lying in bed. No complaints. NGT in place.  Objective: Vital signs: Vitals:   11/03/18 2115 11/04/18 0632  BP: 137/80 (!) 147/71  Pulse: 83 86  Resp: 20 16  Temp: 98.5 F (36.9 C) 98.1 F (36.7 C)  SpO2: 100% 99%   NGT output 400 cc in last 12 hours  Physical Exam: Gen: cachetic, no acute distress, chronically ill-appearing HEENT: anicteric sclera CV: RRR Chest: CTA B Abd: flat, nontender, nondistended, +BS Ext: no edema  Lab Results: Recent Labs    11/03/18 0408 11/04/18 0447  NA 135 137  K 4.1 4.0  CL 101 104  CO2 28 28  GLUCOSE 116* 117*  BUN 20 19  CREATININE 0.81 0.67  CALCIUM 7.9* 8.0*  MG 1.9 2.0  PHOS 3.9 3.4   No results for input(s): AST, ALT, ALKPHOS, BILITOT, PROT, ALBUMIN in the last 72 hours. 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 I would favor exploratory surgery instead of endoscopic evaluation unless surgery feels that doing an EGD would change the timing of surgery. Agree with TPN to improve his malnourished state. Dr. Jon Billings will re-evaluate tomorrow.   Vincent Park 11/04/2018, 10:58 AM  Questions please call [phone] ID: Vincent Park, male   DOB: 31-May-1956, 63 y.o.   MRN: [id number]

## 2018-11-04 NOTE — Progress Notes (Signed)
PHARMACY - ADULT TOTAL PARENTERAL NUTRITION CONSULT NOTE   Pharmacy Consult for TPN Indication: bowel rest, severe malnutrition PTA  Patient Measurements: Height: _0  (188 cm) Weight: 125 lb 14.1 oz (57.1 kg) IBW/kg (Calculated) : 82.2 TPN AdjBW (KG): 54 Body mass index is 16.16 kg/m. Usual Weight: unknown as patient is poor historian, but reports significant weight loss over the past year  HPI: 44 yoM with PMH PUD with perforated gastric ulcer s/p repair in 2003, GERD presents with worsening chronic abdominal pain and associated weight loss. Cachectic appearing. CT reveals ascites with concern for SBO vs recurrent ulcer disease. Pt is NPO. Consulted by surgery for TPN management. Pt will require nutritional support prior to any surgical intervention.  Central access: Double lumen PICC placed 2/14 TPN start date: 2/14  Significant events:  2/23: per [facility], continue NGT, NPO and TPN today  ASSESSMENT                                                                                               Insulin requirements: 1 unit SSI given last 24hr  Current Nutrition: NPO except for ice chips  IVF: NS at 50 mL/hr  Today, 11/04/2018:  Glucose (CBG goal 100-150) - CBGs continue be at goal and stable  Electrolytes - WNL stable  Renal - Mild AKI on admission - resolved. SCr now WNL. UOP 1.365 mL 71m/kg/hr last 24hr  Bicarb normal  LFTs, Alk Phos - WNL. Albumin (1.7) low  TGs - WNL (120, 2/15), (117, 2/17)   Prealbumin - <5 (low, chronic malnutrition)  NUTRITIONAL GOALS                                                                                 RD recs (per note on 2/14): Kcal: 1900 - 2100 grams Protein: 95 - 115 g Fluid: >/= 1.9 L/day  Custom TPN at goal rate of 80 mL/hr will provide: 2070 kcal, 105 g protein, and 326 g dextrose, meeting 100% of patient needs    PLAN                                                                                                                At 1800 today:  Continue TPN at goal rate of 80 mL/hr providing 100 % of patient needs with 2070 kcal and 105  g protein  Continue with standard electrolytes with increase Mg slightly to 7.5 meq/L. Cl:Ac 1:2.   continue IVF to 50 mL/hr  Continue sensitive SSI but will change to q8h CBG monitoring  Check BMet with mag and phos in am  TPN lab panels on Mondays & Thursdays    Adrian Saran, PharmD, BCPS 11/04/2018 9:04 AM

## 2018-11-04 NOTE — Progress Notes (Signed)
PROGRESS NOTE  Vincent Park PQA:[id number] DOB: 1956-06-08 DOA: 10/25/2018 PCP: Patient, No Pcp Per   LOS: 10 days   Brief Narrative / Interim history: 63 year old [place] male with history of perforated ulcer in 2003 status post repair, reflux, severe protein calorie malnutrition admitted 2/13 with abdominal pain for 2 weeks.  Patient recently moved from [place] to [place].  Has no PCP.  Has not seen a physician in years.  In ED, CT abdomen showed dilated thickened small bowel with transition point for obstruction with free air concerning for pneumoperitoneum. General surgery consulted manage conservatively with NG tube decompression and empiric antibiotic (Zosyn 2/14-->).  Patient had a single column upper GI series performed on 2/19 that showed large ulcer without definite leak.   Bowel obstruction, history of gastric ulcer and significant muscle mass wasting raised concern for undiagnosed malignancy.  Cancer markers including CA-125, CEA and CA 19-9 ordered.  The later elevated to 202.  MRI abdomen on 2/21 showed multiple intra-abdominal abscesses, 15.1 x 8.3 x 21 cm on the left abdomen and 8.3 x 14.8 x 21 cm in the right abdomen.  There was also a masslike nonspecific wall thickening involving the proximal stomach.  IR consulted and drained the abscesses and placed drains.  Gram stain positive for GPC's.  Culture negative.  [facility] consulted for possible EGD on 2/22.  In regards to malnutrition, started on TPN with the guidance of nutrition and pharmacy.  NG tube retained per general surgery recommendation.  Subjective: No major events overnight of this morning.  Remains on TPN.  Denies abdominal pain.  Has not had bowel movement recently.  He denies chest pain, dyspnea, nausea or vomiting.  Assessment & Plan: Principal Problem:   SBO (small bowel obstruction) (HCC) Active Problems:   AKI (acute kidney injury) (HCC)   Cachexia (HCC)   PUD (peptic ulcer disease)   Moderate alcohol  consumption   GERD (gastroesophageal reflux disease)   Pneumoperitoneum   Protein-calorie malnutrition, severe (HCC)   Intra-abdominal abscess (HCC)  SBO/pneumoperitoneum/intra-abdominal abscesses/ascites:  -CT abdomen and pelvis on 10/25/2018 concerning for high-grade SBO, partial malrotation of small bowel, significant ascites, free intraperitoneal air and peritoneal enhancement. -Abdominal x-ray on 2/14 showed SBO with ascites again. -SBO resolved with NGT decompression. Patient has been having BM's but surgery keeping NGT -UGI on 2/19 showed large ulcer without definite leak -CEA and CEA from 125 within normal range.  CA 19-9 elevated to 202. -MRI abdomen on 2/21 showed multiple intra-abdominal abscesses and nonspecific proximal stomach wall thickening -IR consulted and drained the abscesses and placed 2 drains on 2/21.  Gram stain positive for GPC's.  Culture negative. -[facility] following and recommends exploratory laparotomy versus EGD. -Zosyn 2/14--> -Continue TPN-appreciate dietitian and pharmacy help -Continue PPI. -OOB -PT/OT  AKI: Resolved. -Avoid nephrotoxic meds.  Severe protein calorie malnutrition: BMI 15.  -Work-up for malignancy as above -Appreciate dietitian and pharmacy help -Continue TPN -Monitor electrolytes  Scheduled Meds: . heparin injection (subcutaneous)  5,000 Units Subcutaneous Q8H  . insulin aspart  0-9 Units Subcutaneous Q6H  . pantoprazole (PROTONIX) IV  40 mg Intravenous Q12H  . sodium chloride flush  10-40 mL Intracatheter Q12H  . sodium chloride flush  5 mL Intracatheter Q8H   Continuous Infusions: . sodium chloride 50 mL/hr at 11/04/18 0600  . piperacillin-tazobactam (ZOSYN)  IV 3.375 g (11/04/18 0629)  . TPN ADULT (ION) 80 mL/hr at 11/04/18 0600  . TPN ADULT (ION)     PRN Meds:.Melatonin, sodium chloride  flush  DVT prophylaxis: Subcu Lovenox Code Status: Full code Family Communication: None at bedside Disposition Plan: Remains  inpatient  Consultants:   None at surgery  Procedures:   NG tube  Antimicrobials:  None  Objective: Vitals:   11/03/18 1018 11/03/18 1355 11/03/18 2115 11/04/18 0632  BP: (!) 135/94 109/63 137/80 (!) 147/71  Pulse: 79 80 83 86  Resp: (!) Temp: 98 F (36.7 C) 98.1 F (36.7 C) 98.5 F (36.9 C) 98.1 F (36.7 C)  TempSrc: Oral  Oral Oral  SpO2: 97% 100% 100% 99%  Weight:      Height:        Intake/Output Summary (Last 24 hours) at 11/04/2018 1116 Last data filed at 11/04/2018 1000 Gross per 24 hour  Intake 2996.61 ml  Output 2085 ml  Net 911.61 ml   Filed Weights   10/25/18 2218 10/26/18 1258 11/02/18 1006  Weight: 54 kg 54.8 kg 57.1 kg    Examination:  GENERAL: Appears well. No acute distress.  HEENT: MMM.  Vision and Hearing grossly intact.  Significant temporal muscle wasting. NECK: Supple.  No JVD.  LUNGS:  No IWOB. Good air movement. CTAB.  HEART:  RRR. Heart sounds normal.  ABD: Bowel sounds present. Soft. Non tender.  Drains in place.  Small amount of pus.  EXT:   no edema bilaterally.  Significant muscle mass wasting. SKIN: no apparent skin lesion.  NEURO: Awake, alert and oriented appropriately.  No gross deficit.  PSYCH: Calm. Normal affect.  Data Reviewed: I have independently reviewed following labs and imaging studies   CBC: Recent Labs  Lab 10/29/18 0406 11/03/18 0408 11/04/18 0806  WBC 4.9 3.4* 4.5  NEUTROABS 3.1  --   --   HGB 9.8* 9.5* 9.4*  HCT 30.1* 29.8* 28.9*  MCV 104.9* 103.8* 104.0*  PLT 274 308 303   Basic Metabolic Panel: Recent Labs  Lab 10/30/18 0500 10/31/18 0443 11/01/18 0400 11/03/18 0408 11/04/18 0447  NA 141 138 135 135 137  K 3.7 4.1 4.2 4.1 4.0  CL 110 107 104 101 104  CO2 GLUCOSE 107* 107* 110* 116* 117*  BUN CREATININE 0.83 0.72 0.74 0.81 0.67  CALCIUM 7.9* 7.8* 7.8* 7.9* 8.0*  MG 1.8 1.9 2.0 1.9 2.0  PHOS 3.4 3.4 3.4 3.9 3.4   GFR: Estimated Creatinine  Clearance: 75.3 mL/min (by C-G formula based on SCr of 0.67 mg/dL). Liver Function Tests: Recent Labs  Lab 10/29/18 0406 11/01/18 0400  AST 34 29  ALT 27 24  ALKPHOS 98 124  BILITOT 0.1* 0.8  PROT 5.9* 6.5  ALBUMIN 1.7* 1.7*   No results for input(s): LIPASE, AMYLASE in the last 168 hours. No results for input(s): AMMONIA in the last 168 hours. Coagulation Profile: No results for input(s): INR, PROTIME in the last 168 hours. Cardiac Enzymes: No results for input(s): CKTOTAL, CKMB, CKMBINDEX, TROPONINI in the last 168 hours. BNP (last 3 results) No results for input(s): PROBNP in the last 8760 hours. HbA1C: No results for input(s): HGBA1C in the last 72 hours. CBG: Recent Labs  Lab 11/03/18 0609 11/03/18 1156 11/03/18 1754 11/04/18 0021 11/04/18 0629  GLUCAP 117* 133* 109* 105* 116*   Lipid Profile: No results for input(s): CHOL, HDL, LDLCALC, TRIG, CHOLHDL, LDLDIRECT in the last 72 hours. Thyroid Function Tests: No results for input(s): TSH, T4TOTAL, FREET4, T3FREE, THYROIDAB in the last 72 hours. Anemia Panel: No  results for input(s): VITAMINB12, FOLATE, FERRITIN, TIBC, IRON, RETICCTPCT in the last 72 hours. Urine analysis:    Component Value Date/Time   COLORURINE AMBER (A) 10/25/2018 1816   APPEARANCEUR CLEAR 10/25/2018 1816   LABSPEC 1.020 10/25/2018 1816   PHURINE 5.5 10/25/2018 1816   GLUCOSEU NEGATIVE 10/25/2018 1816   HGBUR NEGATIVE 10/25/2018 1816   BILIRUBINUR NEGATIVE 10/25/2018 1816   KETONESUR NEGATIVE 10/25/2018 1816   PROTEINUR NEGATIVE 10/25/2018 1816   NITRITE NEGATIVE 10/25/2018 1816   LEUKOCYTESUR NEGATIVE 10/25/2018 1816   Sepsis Labs: Invalid input(s): PROCALCITONIN, LACTICIDVEN  Recent Results (from the past 240 hour(s))  Aerobic/Anaerobic Culture (surgical/deep wound)     Status: None (Preliminary result)   Collection Time: 11/02/18  4:42 PM  Result Value Ref Range Status   Specimen Description   Final    ABSCESS Performed at  [facility], [address], [place], [place] [address]    Special Requests INTRA ABDOMINAL  Final   Gram Stain   Final    ABUNDANT WBC PRESENT, PREDOMINANTLY PMN ABUNDANT GRAM POSITIVE COCCI Performed at [facility], [address] [place], [place] [address]    Culture NO GROWTH < 24 HOURS  Final   Report Status PENDING  Incomplete      Radiology Studies: No results found.   [facility] Pager [phone]  If 7PM-7AM, please contact night-coverage [email or web address] Password [facility] 11/04/2018, 11:16 AM

## 2018-11-04 NOTE — Progress Notes (Signed)
Subjective/Chief Complaint: No complaints. Denies abd pain   Objective: Vital signs in last 24 hours: Temp:  [98 F (36.7 C)-98.5 F (36.9 C)] 98.1 F (36.7 C) (02/23 2010) Pulse Rate:  [79-86] 86 (02/23 0632) Resp:  [16-28] 16 (02/23 0712) BP: (109-147)/(63-94) 147/71 (02/23 1975) SpO2:  [97 %-100 %] 99 % (02/23 0632) Last BM Date: 11/01/18  Intake/Output from previous day: 02/22 0701 - 02/23 0700 In: 3429.2 [I.V.:3309.1; IV Piggyback:100] Out: 2244 [Urine:1365; Emesis/NG output:600; Drains:279] Intake/Output this shift: Total I/O In: -  Out: 100 [Urine:100]  General appearance: alert and cooperative Resp: clear to auscultation bilaterally Cardio: regular rate and rhythm GI: soft, nontender. drain output purulent  Lab Results:  Recent Labs    11/03/18 0408 11/04/18 0806  WBC 3.4* 4.5  HGB 9.5* 9.4*  HCT 29.8* 28.9*  PLT 308 303   BMET Recent Labs    11/03/18 0408 11/04/18 0447  NA 135 137  K 4.1 4.0  CL 101 104  CO2 28 28  GLUCOSE 116* 117*  BUN 20 19  CREATININE 0.81 0.67  CALCIUM 7.9* 8.0*   PT/INR No results for input(s): LABPROT, INR in the last 72 hours. ABG No results for input(s): PHART, HCO3 in the last 72 hours.  Invalid input(s): PCO2, PO2  Studies/Results: Dg Abd 1 View  Result Date: 11/03/2018 CLINICAL DATA:  Encounter for nasogastric tube placement. EXAM: ABDOMEN - 1 VIEW COMPARISON:  10/26/2018 FINDINGS: Nasogastric tube in the upper abdomen. Catheter tip is likely in the proximal stomach. The tube side hole is at the GE junction. Bilateral abdominal pigtail drains. Pockets of gas in the left upper abdomen appear to represent persistent free air. Elevation of the right hemidiaphragm with probable right basilar atelectasis. IMPRESSION: Tip of the nasogastric tube is in the proximal stomach region. The side-hole is near the GE junction. Bilateral abdominal pigtail drains. Persistent free air in the left upper quadrant of the abdomen.  Electronically Signed   By: Richarda Overlie M.D.   On: 11/03/2018 11:20   Mr Abdomen W Wo Contrast  Result Date: 11/02/2018 CLINICAL DATA:  Evaluate for malignancy. EXAM: MRI ABDOMEN WITHOUT AND WITH CONTRAST TECHNIQUE: Multiplanar multisequence MR imaging of the abdomen was performed both before and after the administration of intravenous contrast. CONTRAST:  5 cc of Gadavist. COMPARISON:  10/25/2018 FINDINGS: Lower chest: Small right pleural effusion is new from previous exam. Hepatobiliary: Mild hepatic steatosis. No focal enhancing liver lesions identified. The gallbladder appears normal. The common bile duct measures 6 mm in maximum dimension. Pancreas: Normal appearance of the pancreas. No inflammation mass or main duct dilatation. Spleen:  Within normal limits in size and appearance. Adrenals/Urinary Tract: Normal appearance of the adrenal glands. Small 8 mm cyst noted within the left kidney. No enhancing mass or hydronephrosis. Stomach/Bowel: Masslike thickening of the proximal wall of stomach is identified. This measures approximately 8.5 cm in length and has a thickness of 3.1 cm. This is nonspecific and although this may be due to gastritis underlying mass is not excluded. Persistent abnormal dilatation of small-bowel loops is again identified measuring up to 3.8 cm in diameter. Gaseous distension of the colon is noted as well. Vascular/Lymphatic: Normal appearance of the abdominal aorta. No aneurysm. No definitive adenopathy noted at this time. Other: There are multiple large peripherally enhancing fluid collections identified within both sides of the abdomen. Within the left abdomen fluid collection measures 15.1 by 8.3 by 21.1 cm. In the right hemiabdomen there is a large fluid  collection measuring 8.3 x 14.8 by 21.0 cm. These appear more well defined with progressive mural enhancement compatible with mature fluid collections. There is progressive mass effect along the dome of liver, image 19/903.  Continued foci of pneumoperitoneum also noted, for example overlying anterior left lobe of liver, image 32/901. Musculoskeletal: No suspicious bone lesions identified. IMPRESSION: 1. Large bilateral abdominal fluid collections are identified on today's exam. These appear increased in size with peripheral enhancement compatible with maturing fluid collections. There is also continued pneumoperitoneum. Findings are compatible with perforated viscus. Suspect multiple intra-abdominal abscesses. 2. There is nonspecific, masslike wall thickening involving the proximal stomach. Findings may be due to gastritis. Underlying mass would be difficult to exclude and correlation with endoscopic inspection should be considered. 3. Persistent abnormal distension of small bowel loops. There is also gaseous distension of the colon. In the setting of perforated viscus with multiple intra-abdominal fluid collections assist a nonspecific finding and may reflect inflammatory ileus. Bowel obstruction can up be excluded. 4. New small right pleural effusion. Electronically Signed   By: Signa Kell M.D.   On: 11/02/2018 15:03   Ir US Guide Bx Asp/drain  Result Date: 11/02/2018 INDICATION: Bilateral intra-abdominal peritoneal fluid collections EXAM: Ultrasound bilateral abdominal flank abscess drains MEDICATIONS: The patient is currently admitted to the hospital and receiving intravenous antibiotics. The antibiotics were administered within an appropriate time frame prior to the initiation of the procedure. ANESTHESIA/SEDATION: Fentanyl 100 mcg IV; Versed 2.0 mg IV Moderate Sedation Time:  13 minutes The patient was continuously monitored during the procedure by the interventional radiology nurse under my direct supervision. COMPLICATIONS: None. PROCEDURE: Informed written consent was obtained from the patient after a thorough discussion of the procedural risks, benefits and alternatives. All questions were addressed. Maximal Sterile  Barrier Technique was utilized including caps, mask, sterile gowns, sterile gloves, sterile drape, hand hygiene and skin antiseptic. A timeout was performed prior to the initiation of the procedure. Previous imaging reviewed. Ultrasound performed. The complex bilateral flank abdominal fluid collection or localized and marked. Under sterile conditions and local anesthesia, ultrasound needle access performed of the bilateral flank abdominal fluid collections with 18 gauge 15 cm needles. Needle positions confirmed with ultrasound. Images obtained for documentation. Amplatz guidewires inserted followed by bilateral 10 French drains. Drain catheter positions confirmed with ultrasound. Syringe aspiration yielded purulent fluid bilaterally compatible with peritonitis and intra-abdominal pus. Catheter secured with Prolene sutures and connected to external gravity drainage bags. Approximately 1 L of purulent fluid drained from each catheter. IMPRESSION: Successful ultrasound bilateral abdominal flank abscess drain placements. Purulent fluid removed. Sample sent for culture and cytology. Electronically Signed   By: Judie Petit.  Shick M.D.   On: 11/02/2018 16:52   Ir US Guide Bx Asp/drain  Result Date: 11/02/2018 INDICATION: Bilateral intra-abdominal peritoneal fluid collections EXAM: Ultrasound bilateral abdominal flank abscess drains MEDICATIONS: The patient is currently admitted to the hospital and receiving intravenous antibiotics. The antibiotics were administered within an appropriate time frame prior to the initiation of the procedure. ANESTHESIA/SEDATION: Fentanyl 100 mcg IV; Versed 2.0 mg IV Moderate Sedation Time:  13 minutes The patient was continuously monitored during the procedure by the interventional radiology nurse under my direct supervision. COMPLICATIONS: None. PROCEDURE: Informed written consent was obtained from the patient after a thorough discussion of the procedural risks, benefits and alternatives. All  questions were addressed. Maximal Sterile Barrier Technique was utilized including caps, mask, sterile gowns, sterile gloves, sterile drape, hand hygiene and skin antiseptic. A timeout was performed prior  to the initiation of the procedure. Previous imaging reviewed. Ultrasound performed. The complex bilateral flank abdominal fluid collection or localized and marked. Under sterile conditions and local anesthesia, ultrasound needle access performed of the bilateral flank abdominal fluid collections with 18 gauge 15 cm needles. Needle positions confirmed with ultrasound. Images obtained for documentation. Amplatz guidewires inserted followed by bilateral 10 French drains. Drain catheter positions confirmed with ultrasound. Syringe aspiration yielded purulent fluid bilaterally compatible with peritonitis and intra-abdominal pus. Catheter secured with Prolene sutures and connected to external gravity drainage bags. Approximately 1 L of purulent fluid drained from each catheter. IMPRESSION: Successful ultrasound bilateral abdominal flank abscess drain placements. Purulent fluid removed. Sample sent for culture and cytology. Electronically Signed   By: Judie Petit.  Shick M.D.   On: 11/02/2018 16:52    Anti-infectives: Anti-infectives (From admission, onward)   Start     Dose/Rate Route Frequency Ordered Stop   10/26/18 0400  piperacillin-tazobactam (ZOSYN) IVPB 3.375 g     3.375 g 12.5 mL/hr over 240 Minutes Intravenous Every 8 hours 10/26/18 0245     10/25/18 1930  piperacillin-tazobactam (ZOSYN) IVPB 3.375 g     3.375 g 100 mL/hr over 30 Minutes Intravenous  Once 10/25/18 1928 10/25/18 2021      Assessment/Plan: s/p Procedure(s): EXPLORATORY  LAPAROSCOPY POSSIBLE LAPAROTOMY (N/A) Continue ng and bowel rest  Hx of perforated gastric ulcer - s/p repair in 2003 Hx of medication non-compliance AKI- improving   Free air with ascites and thickened bowel loops -Benign exam and no signs of sepsis - this is  likely subacute - this could be secondary to obstruction vs recurrent ulcer diseasevs perforated gastric cancer? - CA 19-9 elevated - patient is significantly malnourished with temporal wasting - needs TPN and nutritional support prior to any operative intervention - continue PPIBID, TPN and NPO - study yesterday showed no definitive leak, free air still seen - MR abdomen ordered  - may need GI consult to consider endoscopy to r/o malignancy  Severe protein calorie malnutrition- prealbumin5.72/17, continue TPN for now  FEN: NPO, IVF; protonixBID VTE: SCDs ID: zosyn 2/13>>  CA 19-9 elevated, recommend MRI and likely eventual GI consult. Continue NGT and TPN for now. Protonix BID.  LOS: 10 days    Chevis Pretty III 11/04/2018

## 2018-11-05 DIAGNOSIS — D638 Anemia in other chronic diseases classified elsewhere: Secondary | ICD-10-CM

## 2018-11-05 LAB — PHOSPHORUS: Phosphorus: 3.4 mg/dL (ref 2.5–4.6)

## 2018-11-05 LAB — COMPREHENSIVE METABOLIC PANEL
ALT: 23 U/L (ref 0–44)
AST: 29 U/L (ref 15–41)
Albumin: 1.7 g/dL — ABNORMAL LOW (ref 3.5–5.0)
Alkaline Phosphatase: 152 U/L — ABNORMAL HIGH (ref 38–126)
Anion gap: 4 — ABNORMAL LOW (ref 5–15)
BUN: 17 mg/dL (ref 8–23)
CHLORIDE: 104 mmol/L (ref 98–111)
CO2: 29 mmol/L (ref 22–32)
Calcium: 8 mg/dL — ABNORMAL LOW (ref 8.9–10.3)
Creatinine, Ser: 0.73 mg/dL (ref 0.61–1.24)
GFR calc Af Amer: >60 mL/min (ref >60)
Glucose, Bld: 114 mg/dL — ABNORMAL HIGH (ref 70–99)
Potassium: 3.8 mmol/L (ref 3.5–5.1)
Sodium: 137 mmol/L (ref 135–145)
Total Bilirubin: 0.3 mg/dL (ref 0.3–1.2)
Total Protein: 6.4 g/dL — ABNORMAL LOW (ref 6.5–8.1)

## 2018-11-05 LAB — DIFFERENTIAL
Abs Immature Granulocytes: 0.02 10*3/uL (ref 0.00–0.07)
Basophils Absolute: 0 10*3/uL (ref 0.0–0.1)
Basophils Relative: 0 %
Eosinophils Absolute: 0.1 10*3/uL (ref 0.0–0.5)
Eosinophils Relative: 3 %
IMMATURE GRANULOCYTES: 0 %
LYMPHS ABS: 1.6 10*3/uL (ref 0.7–4.0)
Lymphocytes Relative: 31 %
Monocytes Absolute: 0.7 10*3/uL (ref 0.1–1.0)
Monocytes Relative: 13 %
Neutro Abs: 2.7 10*3/uL (ref 1.7–7.7)
Neutrophils Relative %: 53 %

## 2018-11-05 LAB — CBC
HCT: 28.2 % — ABNORMAL LOW (ref 39.0–52.0)
Hemoglobin: 9.1 g/dL — ABNORMAL LOW (ref 13.0–17.0)
MCH: 33.1 pg (ref 26.0–34.0)
MCHC: 32.3 g/dL (ref 30.0–36.0)
MCV: 102.5 fL — ABNORMAL HIGH (ref 80.0–100.0)
Platelets: 280 10*3/uL (ref 150–400)
RBC: 2.75 MIL/uL — ABNORMAL LOW (ref 4.22–5.81)
RDW: 13.6 % (ref 11.5–15.5)
WBC: 5.1 10*3/uL (ref 4.0–10.5)
nRBC: 0 % (ref 0.0–0.2)

## 2018-11-05 LAB — TRIGLYCERIDES: Triglycerides: 89 mg/dL (ref <150)

## 2018-11-05 LAB — GLUCOSE, CAPILLARY: Glucose-Capillary: 105 mg/dL — ABNORMAL HIGH (ref 70–99)

## 2018-11-05 LAB — PREALBUMIN: Prealbumin: 10.2 mg/dL — ABNORMAL LOW (ref 18–38)

## 2018-11-05 LAB — MAGNESIUM: Magnesium: 1.9 mg/dL (ref 1.7–2.4)

## 2018-11-05 MED ORDER — TRAVASOL 10 % IV SOLN
INTRAVENOUS | Status: AC
  Administered 2018-11-05: 18:00:00 via INTRAVENOUS
  Filled 2018-11-05: qty 1056

## 2018-11-05 NOTE — Progress Notes (Signed)
PHARMACY - ADULT TOTAL PARENTERAL NUTRITION CONSULT NOTE   Pharmacy Consult for TPN Indication: bowel rest, severe malnutrition PTA  Patient Measurements: Height: _0  (188 cm) Weight: 125 lb 14.1 oz (57.1 kg) IBW/kg (Calculated) : 82.2 TPN AdjBW (KG): 54 Body mass index is 16.16 kg/m. Usual Weight: unknown as patient is poor historian, but reports significant weight loss over the past year  HPI: 45 yoM with PMH PUD with perforated gastric ulcer s/p repair in 2003, GERD presents with worsening chronic abdominal pain and associated weight loss. Cachectic appearing. CT reveals ascites with concern for SBO vs recurrent ulcer disease. Pt is NPO. Consulted by surgery for TPN management. Pt will require nutritional support prior to any surgical intervention.  Central access: Double lumen PICC placed 2/14 TPN start date: 2/14  Significant events:  2/23: per [facility], continue NGT, NPO and TPN today  ASSESSMENT                                                                                               Insulin requirements: none  Current Nutrition: NPO except for ice chips  IVF: NS at 50 mL/hr  Today, 11/05/2018:  Glucose (CBG goal 100-150) - CBGs continue be at goal and stable on goal rate TPN  Electrolytes - WNL stable  Renal - Mild AKI on admission - resolved. SCr now WNL  LFTs, Alk Phos - WNL. Albumin (1.7) low  TGs - WNL (120, 2/15), (117, 2/17)  89 2/24  Prealbumin - <5  2/14, 5.7 2/17, 10.2 2/24 - coming up on TPN  NUTRITIONAL GOALS                                                                                 RD recs (per note on 2/14): Kcal: 1900 - 2100 grams Protein: 95 - 115 g Fluid: >/= 1.9 L/day  Custom TPN at goal rate of 80 mL/hr will provide: 2070 kcal, 105 g protein, and 326 g dextrose, meeting 100% of patient needs    PLAN                                                                                                               At 1800  today:  Continue TPN at goal rate of 80 mL/hr providing 100 % of patient needs with 2070 kcal and 105  g protein  Continue with standard electrolytes with increase Mg slightly to 7.5 meq/L. Cl:Ac 1:2.   continue IVF to 50 mL/hr  DC SSI and CBGs  TPN lab panels on Mondays & Thursdays  Eudelia Bunch, Pharm.D [phone] 11/05/2018 7:29 AM

## 2018-11-05 NOTE — Progress Notes (Signed)
   Subjective/Chief Complaint: No complaints. Denies abd pain   Objective: Vital signs in last 24 hours: Temp:  [97.3 F (36.3 C)-98.9 F (37.2 C)] 98.9 F (37.2 C) (02/24 0513) Pulse Rate:  [84-97] 84 (02/24 0513) Resp:  [18-23] 20 (02/24 0513) BP: (133-141)/(69-83) 140/69 (02/24 0513) SpO2:  [100 %] 100 % (02/24 0513) Last BM Date: 11/04/18  Intake/Output from previous day: 02/23 0701 - 02/24 0700 In: 2854.9 [I.V.:2582.2; IV Piggyback:252.7] Out: 3090 [Urine:1425; Emesis/NG output:1300; Drains:365] Intake/Output this shift: No intake/output data recorded.  General appearance: alert and cooperative  GI: soft, nontender. drain output purulent  Lab Results:  Recent Labs    11/04/18 0806 11/05/18 0429  WBC 4.5 5.1  HGB 9.4* 9.1*  HCT 28.9* 28.2*  PLT 303 280   BMET Recent Labs    11/04/18 0447 11/05/18 0429  NA 137 137  K 4.0 3.8  CL 104 104  CO2 28 29  GLUCOSE 117* 114*  BUN 19 17  CREATININE 0.67 0.73  CALCIUM 8.0* 8.0*   PT/INR No results for input(s): LABPROT, INR in the last 72 hours. ABG No results for input(s): PHART, HCO3 in the last 72 hours.  Invalid input(s): PCO2, PO2  Studies/Results: No results found.  Anti-infectives: Anti-infectives (From admission, onward)   Start     Dose/Rate Route Frequency Ordered Stop   10/26/18 0400  piperacillin-tazobactam (ZOSYN) IVPB 3.375 g     3.375 g 12.5 mL/hr over 240 Minutes Intravenous Every 8 hours 10/26/18 0245     10/25/18 1930  piperacillin-tazobactam (ZOSYN) IVPB 3.375 g     3.375 g 100 mL/hr over 30 Minutes Intravenous  Once 10/25/18 1928 10/25/18 2021      Assessment/Plan: s/p Procedure(s): EXPLORATORY  LAPAROSCOPY POSSIBLE LAPAROTOMY (N/A) Continue ng and bowel rest  Hx of perforated gastric ulcer - s/p repair in 2003 Hx of medication non-compliance AKI- improving   Free air with ascites and thickened bowel loops -Benign exam and no signs of sepsis - this is likely  subacute - this could be secondary to obstruction vs recurrent ulcer diseasevs perforated gastric cancer? - CA 19-9 elevated - patient is significantly malnourished with temporal wasting - needs TPN and nutritional support prior to any operative intervention - continue PPIBID, TPN and NPO - UGI study showed no definitive leak, free air still seen - MR abdomen shows gastric thickening - GI aware of pt to consider endoscopy to r/o malignancy  Severe protein calorie malnutrition- prealbumin5.72/17, continue TPN for now  FEN: NPO, TPN, IVF; protonixBID VTE: SCDs ID: zosyn 2/13>>  CA 19-9 elevated, needs further workup Continue NGT and TPN for now. Protonix BID. Cont IR drains and abx   LOS: 11 days    Vincent Park 11/05/2018

## 2018-11-05 NOTE — Progress Notes (Signed)
Referring Physician(s): Gonfa,T  Supervising Physician: Gilmer Mor  Patient Status:  [facility] - In-pt  Chief Complaint: S/p [place] bilateral abdominal flank drain placement   Subjective: Patient is doing well and has no complaints. Patient denies abdominal pain since drains being put in on 2/21. Patient denies fever, nausea, and vomiting.    Allergies: Patient has no known allergies.  Medications: Prior to Admission medications   Medication Sig Start Date End Date Taking? Authorizing Provider  Alum & Mag Hydroxide-Simeth (GI COCKTAIL) SUSP suspension Take 30 mLs by mouth 2 (two) times daily. Shake well. 10/10/18  Yes Gwyneth Sprout, MD  omeprazole (PRILOSEC) 20 MG capsule Take 1 capsule (20 mg total) by mouth daily. 10/10/18  Yes Gwyneth Sprout, MD     Vital Signs: BP 133/64 (BP Location: Right Wrist)   Pulse 76   Temp 98.5 F (36.9 C) (Oral)   Resp 19   Ht  (1.88 m)   Wt 125 lb 14.1 oz (57.1 kg)   SpO2 100%   BMI 16.16 kg/m   Physical Exam  Patient is awake and alert, resting comfortably in bed. Drainage sites are clean and dry with no erythema/swelling present. No tenderness upon palpation of drainage sites. Abdomen is soft and non-tender. Drains are in place with purulent drainage from both sides. Drains have removed 365 cc of purulent fluid in last 24 hours.   Imaging: Dg Abd 1 View  Result Date: 11/03/2018 CLINICAL DATA:  Encounter for nasogastric tube placement. EXAM: ABDOMEN - 1 VIEW COMPARISON:  10/26/2018 FINDINGS: Nasogastric tube in the upper abdomen. Catheter tip is likely in the proximal stomach. The tube side hole is at the GE junction. Bilateral abdominal pigtail drains. Pockets of gas in the left upper abdomen appear to represent persistent free air. Elevation of the right hemidiaphragm with probable right basilar atelectasis. IMPRESSION: Tip of the nasogastric tube is in the proximal stomach region. The side-hole is near the GE junction.  Bilateral abdominal pigtail drains. Persistent free air in the left upper quadrant of the abdomen. Electronically Signed   By: Richarda Overlie M.D.   On: 11/03/2018 11:20   Mr Abdomen W Wo Contrast  Result Date: 11/02/2018 CLINICAL DATA:  Evaluate for malignancy. EXAM: MRI ABDOMEN WITHOUT AND WITH CONTRAST TECHNIQUE: Multiplanar multisequence MR imaging of the abdomen was performed both before and after the administration of intravenous contrast. CONTRAST:  5 cc of Gadavist. COMPARISON:  10/25/2018 FINDINGS: Lower chest: Small right pleural effusion is new from previous exam. Hepatobiliary: Mild hepatic steatosis. No focal enhancing liver lesions identified. The gallbladder appears normal. The common bile duct measures 6 mm in maximum dimension. Pancreas: Normal appearance of the pancreas. No inflammation mass or main duct dilatation. Spleen:  Within normal limits in size and appearance. Adrenals/Urinary Tract: Normal appearance of the adrenal glands. Small 8 mm cyst noted within the left kidney. No enhancing mass or hydronephrosis. Stomach/Bowel: Masslike thickening of the proximal wall of stomach is identified. This measures approximately 8.5 cm in length and has a thickness of 3.1 cm. This is nonspecific and although this may be due to gastritis underlying mass is not excluded. Persistent abnormal dilatation of small-bowel loops is again identified measuring up to 3.8 cm in diameter. Gaseous distension of the colon is noted as well. Vascular/Lymphatic: Normal appearance of the abdominal aorta. No aneurysm. No definitive adenopathy noted at this time. Other: There are multiple large peripherally enhancing fluid collections identified within both sides of the abdomen. Within the left  abdomen fluid collection measures 15.1 by 8.3 by 21.1 cm. In the right hemiabdomen there is a large fluid collection measuring 8.3 x 14.8 by 21.0 cm. These appear more well defined with progressive mural enhancement compatible with  mature fluid collections. There is progressive mass effect along the dome of liver, image 19/903. Continued foci of pneumoperitoneum also noted, for example overlying anterior left lobe of liver, image 32/901. Musculoskeletal: No suspicious bone lesions identified. IMPRESSION: 1. Large bilateral abdominal fluid collections are identified on today's exam. These appear increased in size with peripheral enhancement compatible with maturing fluid collections. There is also continued pneumoperitoneum. Findings are compatible with perforated viscus. Suspect multiple intra-abdominal abscesses. 2. There is nonspecific, masslike wall thickening involving the proximal stomach. Findings may be due to gastritis. Underlying mass would be difficult to exclude and correlation with endoscopic inspection should be considered. 3. Persistent abnormal distension of small bowel loops. There is also gaseous distension of the colon. In the setting of perforated viscus with multiple intra-abdominal fluid collections assist a nonspecific finding and may reflect inflammatory ileus. Bowel obstruction can up be excluded. 4. New small right pleural effusion. Electronically Signed   By: Signa Kell M.D.   On: 11/02/2018 15:03   Ir US Guide Bx Asp/drain  Result Date: 11/02/2018 INDICATION: Bilateral intra-abdominal peritoneal fluid collections EXAM: Ultrasound bilateral abdominal flank abscess drains MEDICATIONS: The patient is currently admitted to the hospital and receiving intravenous antibiotics. The antibiotics were administered within an appropriate time frame prior to the initiation of the procedure. ANESTHESIA/SEDATION: Fentanyl 100 mcg IV; Versed 2.0 mg IV Moderate Sedation Time:  13 minutes The patient was continuously monitored during the procedure by the interventional radiology nurse under my direct supervision. COMPLICATIONS: None. PROCEDURE: Informed written consent was obtained from the patient after a thorough discussion  of the procedural risks, benefits and alternatives. All questions were addressed. Maximal Sterile Barrier Technique was utilized including caps, mask, sterile gowns, sterile gloves, sterile drape, hand hygiene and skin antiseptic. A timeout was performed prior to the initiation of the procedure. Previous imaging reviewed. Ultrasound performed. The complex bilateral flank abdominal fluid collection or localized and marked. Under sterile conditions and local anesthesia, ultrasound needle access performed of the bilateral flank abdominal fluid collections with 18 gauge 15 cm needles. Needle positions confirmed with ultrasound. Images obtained for documentation. Amplatz guidewires inserted followed by bilateral 10 French drains. Drain catheter positions confirmed with ultrasound. Syringe aspiration yielded purulent fluid bilaterally compatible with peritonitis and intra-abdominal pus. Catheter secured with Prolene sutures and connected to external gravity drainage bags. Approximately 1 L of purulent fluid drained from each catheter. IMPRESSION: Successful ultrasound bilateral abdominal flank abscess drain placements. Purulent fluid removed. Sample sent for culture and cytology. Electronically Signed   By: Judie Petit.  Shick M.D.   On: 11/02/2018 16:52   Ir US Guide Bx Asp/drain  Result Date: 11/02/2018 INDICATION: Bilateral intra-abdominal peritoneal fluid collections EXAM: Ultrasound bilateral abdominal flank abscess drains MEDICATIONS: The patient is currently admitted to the hospital and receiving intravenous antibiotics. The antibiotics were administered within an appropriate time frame prior to the initiation of the procedure. ANESTHESIA/SEDATION: Fentanyl 100 mcg IV; Versed 2.0 mg IV Moderate Sedation Time:  13 minutes The patient was continuously monitored during the procedure by the interventional radiology nurse under my direct supervision. COMPLICATIONS: None. PROCEDURE: Informed written consent was obtained from  the patient after a thorough discussion of the procedural risks, benefits and alternatives. All questions were addressed. Maximal Sterile Barrier Technique was utilized  including caps, mask, sterile gowns, sterile gloves, sterile drape, hand hygiene and skin antiseptic. A timeout was performed prior to the initiation of the procedure. Previous imaging reviewed. Ultrasound performed. The complex bilateral flank abdominal fluid collection or localized and marked. Under sterile conditions and local anesthesia, ultrasound needle access performed of the bilateral flank abdominal fluid collections with 18 gauge 15 cm needles. Needle positions confirmed with ultrasound. Images obtained for documentation. Amplatz guidewires inserted followed by bilateral 10 French drains. Drain catheter positions confirmed with ultrasound. Syringe aspiration yielded purulent fluid bilaterally compatible with peritonitis and intra-abdominal pus. Catheter secured with Prolene sutures and connected to external gravity drainage bags. Approximately 1 L of purulent fluid drained from each catheter. IMPRESSION: Successful ultrasound bilateral abdominal flank abscess drain placements. Purulent fluid removed. Sample sent for culture and cytology. Electronically Signed   By: Judie Petit.  Shick M.D.   On: 11/02/2018 16:52    Labs:  CBC: Recent Labs    10/29/18 0406 11/03/18 0408 11/04/18 0806 11/05/18 0429  WBC 4.9 3.4* 4.5 5.1  HGB 9.8* 9.5* 9.4* 9.1*  HCT 30.1* 29.8* 28.9* 28.2*  PLT 274 308 303 280    COAGS: Recent Labs    10/26/18 0500  INR 1.13    BMP: Recent Labs    11/01/18 0400 11/03/18 0408 11/04/18 0447 11/05/18 0429  NA 135 135 137 137  K 4.2 4.1 4.0 3.8  CL 104 101 104 104  CO2 27 28 28 29   GLUCOSE 110* 116* 117* 114*  BUN 18 20 19 17   CALCIUM 7.8* 7.9* 8.0* 8.0*  CREATININE 0.74 0.81 0.67 0.73  GFRNONAA >60 >60 >60 >60  GFRAA >60 >60 >60 >60    LIVER FUNCTION TESTS: Recent Labs    10/27/18 0542  10/29/18 0406 11/01/18 0400 11/05/18 0429  BILITOT 0.5 0.1* 0.8 0.3  AST 39 34 29 29  ALT 29 27 24 23   ALKPHOS 97 98 124 152*  PROT 5.8* 5.9* 6.5 6.4*  ALBUMIN 1.7* 1.7* 1.7* 1.7*    Assessment and Plan: Free air with ascites and thickened bowel loops; severe protein calorie nutrition; bilateral abdominal fluid drains. Labs stable; cx pend Continue drains and flush with saline 3 times a day. Continue NG and bowel rest. Keep dressings dry and clean. Once outputs minimal obtain f/u CT to assess for adequacy of drainage.  Electronically Signed: D. Jeananne Rama, PA-C/Maxwell Dennie Bible, PA-S 11/05/2018, 3:00 PM   I spent a total of 15 minutes at the the patient's bedside AND on the patient's hospital floor or unit, greater than 50% of which was counseling/coordinating care for s/p bilateral abdominal flank drain placement    Patient ID: Vincent Park, male   DOB: 04-21-1956, 63 y.o.   MRN: [id number]

## 2018-11-05 NOTE — Progress Notes (Signed)
PROGRESS NOTE  Vincent Park QHU:[id number] DOB: Sep 17, 1955 DOA: 10/25/2018 PCP: Patient, No Pcp Per   LOS: 11 days   Brief Narrative / Interim history: 63 year old [place] male with history of perforated ulcer in 2003 status post repair, reflux, severe protein calorie malnutrition admitted 2/13 with abdominal pain for 2 weeks.  Patient recently moved from [place] to [place].  Has no PCP.  Has not seen a physician in years.  In ED, CT abdomen showed dilated thickened small bowel with transition point for obstruction with free air concerning for pneumoperitoneum. General surgery consulted manage conservatively with NG tube decompression and empiric antibiotic (Zosyn 2/14-->).  Patient had a single column upper GI series performed on 2/19 that showed large ulcer without definite leak.   Bowel obstruction, history of gastric ulcer and significant muscle mass wasting raised concern for undiagnosed malignancy.  Cancer markers including CA-125, CEA and CA 19-9 ordered.  The later elevated to 202.  MRI abdomen on 2/21 showed multiple intra-abdominal abscesses, 15.1 x 8.3 x 21 cm on the left abdomen and 8.3 x 14.8 x 21 cm in the right abdomen.  There was also a masslike nonspecific wall thickening involving the proximal stomach.  IR consulted and drained the abscesses and placed drains.  Gram stain positive for GPC's.  Culture negative.  [facility] consulted on 2/22 and following.   In regards to malnutrition, started on TPN with the guidance of nutrition and pharmacy.  NG tube retained per general surgery recommendation.  Subjective: No major events overnight or this morning.  Remains on TPN.  No complaints this morning.  Denies dyspnea, chest pain, nausea, vomiting or abdominal pain.  Reports having dark bowel movement again.  Assessment & Plan: Principal Problem:   SBO (small bowel obstruction) (HCC) Active Problems:   AKI (acute kidney injury) (HCC)   Cachexia (HCC)   PUD (peptic ulcer  disease)   Moderate alcohol consumption   GERD (gastroesophageal reflux disease)   Pneumoperitoneum   Protein-calorie malnutrition, severe (HCC)   Intra-abdominal abscess (HCC)  SBO/pneumoperitoneum/intra-abdominal abscesses/ascites: having bowel movements. -NG remains in place per general surgery recommendation.  Still with significant output. -CT abdomen and pelvis on 10/25/2018 concerning for high-grade SBO, partial malrotation of small bowel, significant ascites, free intraperitoneal air and peritoneal enhancement. -Abdominal x-ray on 2/14 showed SBO with ascites again. -UGI on 2/19 showed large ulcer without definite leak -CEA and CEA from 125 within normal range.  CA 19-9 elevated to 202. -MRI abdomen on 2/21 showed multiple intra-abdominal abscesses and nonspecific proximal stomach wall thickening -Abscesses drained and drains placed on 2/21 by IR.  Gram stain positive for GPC's.  Culture negative. -[facility] following and recommends exploratory laparotomy versus EGD. -Zosyn 2/14--> -Continue TPN-appreciate dietitian and pharmacy help -Continue PPI. -PT/OT  AKI: Resolved. -Avoid nephrotoxic meds.  Severe protein calorie malnutrition: Improving. -Work-up for malignancy as above -Continue TPN-per nutrition and pharmacy -Monitor electrolytes  Normocytic anemia: Likely anemia of chronic disease.  -Continue monitoring  Scheduled Meds: . heparin injection (subcutaneous)  5,000 Units Subcutaneous Q8H  . pantoprazole (PROTONIX) IV  40 mg Intravenous Q12H  . sodium chloride flush  10-40 mL Intracatheter Q12H  . sodium chloride flush  5 mL Intracatheter Q8H   Continuous Infusions: . sodium chloride 50 mL/hr at 11/05/18 0200  . piperacillin-tazobactam (ZOSYN)  IV 3.375 g (11/05/18 1353)  . TPN ADULT (ION) 80 mL/hr at 11/05/18 0200  . TPN ADULT (ION)     PRN Meds:.Melatonin, sodium chloride flush  DVT  prophylaxis: Subcu Lovenox Code Status: Full code Family Communication:  None at bedside Disposition Plan: Remains inpatient  Consultants:   None at surgery  Procedures:   NG tube  Antimicrobials:  None  Objective: Vitals:   11/04/18 1330 11/04/18 2158 11/05/18 0513 11/05/18 1359  BP: (!) 141/83 133/70 140/69 133/64  Pulse: 97 91 84 76  Resp: 18 (!) 23 20 19   Temp: (!) 97.3 F (36.3 C) 98.9 F (37.2 C) 98.9 F (37.2 C) 98.5 F (36.9 C)  TempSrc: Oral Oral Oral Oral  SpO2: 100% 100% 100% 100%  Weight:      Height:        Intake/Output Summary (Last 24 hours) at 11/05/2018 1456 Last data filed at 11/05/2018 1358 Gross per 24 hour  Intake 1669.03 ml  Output 3090 ml  Net -1420.97 ml   Filed Weights   10/25/18 2218 10/26/18 1258 11/02/18 1006  Weight: 54 kg 54.8 kg 57.1 kg    Examination:  GENERAL: Looks chronically ill HEENT: MMM.  Vision and Hearing grossly intact.  NECK: Supple.  No LAD LUNGS:  No IWOB. Good air movement. CTAB.  HEART:  RRR. Heart sounds normal.  ABD: Bowel sounds present. Soft. Non tender.  2 drains in place.  Small purulent drainage EXT:   no edema bilaterally.  Significant muscle wasting SKIN: no apparent skin lesion.  NEURO: Awake, alert and oriented appropriately.  No gross deficit.  PSYCH: Calm. Normal affect. Data Reviewed: I have independently reviewed following labs and imaging studies   CBC: Recent Labs  Lab 11/03/18 0408 11/04/18 0806 11/05/18 0429  WBC 3.4* 4.5 5.1  NEUTROABS  --   --  2.7  HGB 9.5* 9.4* 9.1*  HCT 29.8* 28.9* 28.2*  MCV 103.8* 104.0* 102.5*  PLT 308 303 280   Basic Metabolic Panel: Recent Labs  Lab 10/31/18 0443 11/01/18 0400 11/03/18 0408 11/04/18 0447 11/05/18 0429  NA 138 135 135 137 137  K 4.1 4.2 4.1 4.0 3.8  CL 107 104 101 104 104  CO2 26 27 28 28 29   GLUCOSE 107* 110* 116* 117* 114*  BUN 17 18 20 19 17   CREATININE 0.72 0.74 0.81 0.67 0.73  CALCIUM 7.8* 7.8* 7.9* 8.0* 8.0*  MG 1.9 2.0 1.9 2.0 1.9  PHOS 3.4 3.4 3.9 3.4 3.4   GFR: Estimated Creatinine  Clearance: 75.3 mL/min (by C-G formula based on SCr of 0.73 mg/dL). Liver Function Tests: Recent Labs  Lab 11/01/18 0400 11/05/18 0429  AST 29 29  ALT 24 23  ALKPHOS 124 152*  BILITOT 0.8 0.3  PROT 6.5 6.4*  ALBUMIN 1.7* 1.7*   No results for input(s): LIPASE, AMYLASE in the last 168 hours. No results for input(s): AMMONIA in the last 168 hours. Coagulation Profile: No results for input(s): INR, PROTIME in the last 168 hours. Cardiac Enzymes: No results for input(s): CKTOTAL, CKMB, CKMBINDEX, TROPONINI in the last 168 hours. BNP (last 3 results) No results for input(s): PROBNP in the last 8760 hours. HbA1C: No results for input(s): HGBA1C in the last 72 hours. CBG: Recent Labs  Lab 11/04/18 0021 11/04/18 0629 11/04/18 1139 11/04/18 1803 11/05/18 0003  GLUCAP 105* 116* 107* 110* 105*   Lipid Profile: Recent Labs    11/05/18 0429  TRIG 89   Thyroid Function Tests: No results for input(s): TSH, T4TOTAL, FREET4, T3FREE, THYROIDAB in the last 72 hours. Anemia Panel: No results for input(s): VITAMINB12, FOLATE, FERRITIN, TIBC, IRON, RETICCTPCT in the last 72 hours. Urine analysis:  Component Value Date/Time   COLORURINE AMBER (A) 10/25/2018 1816   APPEARANCEUR CLEAR 10/25/2018 1816   LABSPEC 1.020 10/25/2018 1816   PHURINE 5.5 10/25/2018 1816   GLUCOSEU NEGATIVE 10/25/2018 1816   HGBUR NEGATIVE 10/25/2018 1816   BILIRUBINUR NEGATIVE 10/25/2018 1816   KETONESUR NEGATIVE 10/25/2018 1816   PROTEINUR NEGATIVE 10/25/2018 1816   NITRITE NEGATIVE 10/25/2018 1816   LEUKOCYTESUR NEGATIVE 10/25/2018 1816   Sepsis Labs: Invalid input(s): PROCALCITONIN, LACTICIDVEN  Recent Results (from the past 240 hour(s))  Aerobic/Anaerobic Culture (surgical/deep wound)     Status: None (Preliminary result)   Collection Time: 11/02/18  4:42 PM  Result Value Ref Range Status   Specimen Description   Final    ABSCESS Performed at [facility], [address] [place], [place] [address]    Special Requests INTRA ABDOMINAL  Final   Gram Stain   Final    ABUNDANT WBC PRESENT, PREDOMINANTLY PMN ABUNDANT GRAM POSITIVE COCCI Performed at [facility], [address], [place], [place] [address]    Culture CULTURE REINCUBATED FOR BETTER GROWTH  Final   Report Status PENDING  Incomplete      Radiology Studies: No results found.  Cejay Cambre T. [facility] Pager [phone]  If 7PM-7AM, please contact night-coverage [email or web address] Password [facility] 11/05/2018, 2:56 PM

## 2018-11-05 NOTE — Progress Notes (Signed)
[facility] Progress Note  Vincent Park 63 y.o. 11-20-1955  CC: Gastric ulcer with microperforation   Subjective: No acute events overnight.  Abdominal pain improving.  Patient denies nausea vomiting.  He denies bleeding.   Objective: Vital signs in last 24 hours: Vitals:   11/04/18 2158 11/05/18 0513  BP: 133/70 140/69  Pulse: 91 84  Resp: (!) 23 20  Temp: 98.9 F (37.2 C) 98.9 F (37.2 C)  SpO2: 100% 100%    Physical Exam:  General.  Thin patient.  Not in acute distress. Abdomen.  Soft, nontender, nondistended, bowel sounds present.  Lab Results: Recent Labs    11/04/18 0447 11/05/18 0429  NA 137 137  K 4.0 3.8  CL 104 104  CO2 28 29  GLUCOSE 117* 114*  BUN 19 17  CREATININE 0.67 0.73  CALCIUM 8.0* 8.0*  MG 2.0 1.9  PHOS 3.4 3.4   Recent Labs    11/05/18 0429  AST 29  ALT 23  ALKPHOS 152*  BILITOT 0.3  PROT 6.4*  ALBUMIN 1.7*   Recent Labs    11/04/18 0806 11/05/18 0429  WBC 4.5 5.1  NEUTROABS  --  2.7  HGB 9.4* 9.1*  HCT 28.9* 28.2*  MCV 104.0* 102.5*  PLT 303 280   No results for input(s): LABPROT, INR in the last 72 hours.    Assessment/Plan: -Abnormal imaging concerning for gastric ulcer ( ??  Gastric malignancy)  with microperforation, intraperitoneal free air and ascites.  MRI showed masslike thickening of the proximal wall of the stomach. -Abdominal flank abscess status post IR guided drain placement. -Small bowel obstruction.  Recommendations ----------------------- -Given his history high-grade small bowel obstruction and possible microperforation, would recommend to hold off on endoscopy.  Air  inflation of the stomach during endoscopy may make perforation worse and  it will make his symptoms of small bowel obstruction worse.  -Recommend conservative management with supportive care for now.  If endoscopy is absolutely indicated, we can consider it after few days -depending on his overall condition.  -  GI will  follow   Kathi Der MD, FACP 11/05/2018, 11:15 AM  Contact #  [phone]

## 2018-11-06 LAB — GLUCOSE, CAPILLARY
Glucose-Capillary: 103 mg/dL — ABNORMAL HIGH (ref 70–99)
Glucose-Capillary: 105 mg/dL — ABNORMAL HIGH (ref 70–99)

## 2018-11-06 MED ORDER — TRAVASOL 10 % IV SOLN
INTRAVENOUS | Status: AC
  Administered 2018-11-06: 18:00:00 via INTRAVENOUS
  Filled 2018-11-06: qty 1056

## 2018-11-06 NOTE — Progress Notes (Signed)
PHARMACY - ADULT TOTAL PARENTERAL NUTRITION CONSULT NOTE   Pharmacy Consult for TPN Indication: bowel rest, severe malnutrition PTA  Patient Measurements: Height: 6' 2" (188 cm) Weight: 125 lb 14.1 oz (57.1 kg) IBW/kg (Calculated) : 82.2 TPN AdjBW (KG): 54 Body mass index is 16.16 kg/m. Usual Weight: unknown as patient is poor historian, but reports significant weight loss over the past year  HPI: 19 yoM with PMH PUD with perforated gastric ulcer s/p repair in 2003, GERD presents with worsening chronic abdominal pain and associated weight loss. Cachectic appearing. CT reveals ascites with concern for SBO vs recurrent ulcer disease. Pt is NPO. Consulted by surgery for TPN management. Pt will require nutritional support prior to any surgical intervention.  Central access: Double lumen PICC placed 2/14 TPN start date: 2/14  Significant events:  2/23: per CCS, continue NGT, NPO and TPN today  ASSESSMENT                                                                                               Insulin requirements: none  Current Nutrition: NPO except for ice chips  IVF: NS at 50 mL/hr  Today, 11/06/2018:  Glucose (CBG goal 100-150) - SSI/CBGs dc'd 2/24  Electrolytes -no labs today  Renal - Mild AKI on admission - resolved. SCr now WNL  LFTs, Alk Phos - WNL. Albumin (1.7) low  TGs - WNL (120, 2/15), (117, 2/17)  89 2/24  Prealbumin - <5  2/14, 5.7 2/17, 10.2 2/24 - coming up on TPN  NUTRITIONAL GOALS                                                                                 RD recs (per note on 2/14): Kcal: 1900 - 2100 grams Protein: 95 - 115 g Fluid: >/= 1.9 L/day  Custom TPN at goal rate of 80 mL/hr will provide: 2070 kcal, 105 g protein, and 326 g dextrose, meeting 100% of patient needs    PLAN                                                                                                               At 1800 today:  Continue TPN at goal rate of 80 mL/hr  providing 100 % of patient needs with 2070 kcal and 105 g protein  Continue with standard electrolytes with  increase Mg slightly to 7.5 meq/L. Cl:Ac 1:2.   continue IVF to 50 mL/hr  TPN lab panels on Mondays & Thursdays  Eudelia Bunch, Pharm.D [phone] 11/06/2018 7:21 AM

## 2018-11-06 NOTE — Progress Notes (Signed)
PROGRESS NOTE  Vincent Park ZOX:[id number] DOB: 1956-08-12 DOA: 10/25/2018 PCP: Patient, No Pcp Per   LOS: 12 days   Brief Narrative / Interim history: 63 year old [place] male with history of perforated ulcer in 2003 status post repair, reflux, severe protein calorie malnutrition admitted 2/13 with abdominal pain for 2 weeks.  Patient recently moved from [place] to [place].  Has no PCP.  Has not seen a physician in years.  In ED, CT abdomen showed dilated thickened small bowel with transition point for obstruction with free air concerning for pneumoperitoneum. General surgery consulted manage conservatively with NG tube decompression and empiric antibiotic (Zosyn 2/14-->).  Patient had a single column upper GI series performed on 2/19 that showed large ulcer without definite leak.   Bowel obstruction, history of gastric ulcer and significant muscle mass wasting raised concern for undiagnosed malignancy.  Cancer markers including CA-125, CEA and CA 19-9 ordered.  The later elevated to 202.  MRI abdomen on 2/21 showed multiple intra-abdominal abscesses, 15.1 x 8.3 x 21 cm on the left abdomen and 8.3 x 14.8 x 21 cm in the right abdomen.  There was also a masslike nonspecific wall thickening involving the proximal stomach.  IR consulted and drained the abscesses and placed drains.  Gram stain positive for GPC's.  Culture negative.  [facility] consulted on 2/22 and following.   In regards to malnutrition, started on TPN with the guidance of nutrition and pharmacy.  NG tube retained per general surgery recommendation.  Subjective: No major events overnight of this morning.  Remains on TPN.  No complaint this morning.  Denies dyspnea, chest pain, nausea, vomiting or abdominal pain.  Last bowel movement 2 days ago.   Assessment & Plan: Principal Problem:   SBO (small bowel obstruction) (HCC) Active Problems:   AKI (acute kidney injury) (HCC)   Cachexia (HCC)   PUD (peptic ulcer disease)  Moderate alcohol consumption   GERD (gastroesophageal reflux disease)   Pneumoperitoneum   Protein-calorie malnutrition, severe (HCC)   Intra-abdominal abscess (HCC)  SBO/pneumoperitoneum/intra-abdominal abscesses/ascites:  -NG remains in place per general surgery recommendation.  Still with some output. -CT abdomen and pelvis on 10/25/2018 concerning for high-grade SBO, partial malrotation of small bowel, significant ascites, free intraperitoneal air and peritoneal enhancement.  General surgery following. -Abdominal x-ray on 2/14 showed SBO with ascites again. -UGI on 2/19 showed large ulcer without definite leak -CEA and CEA from 125 within normal range.  CA 19-9 elevated to 202. -MRI abdomen on 2/21 showed multiple intra-abdominal abscesses and nonspecific proximal stomach wall thickening -Abscesses drained and drains placed on 2/21 by IR.  Gram stain positive for GPC's.  Culture negative. -[facility] following -Zosyn 2/14--> -Continue TPN-appreciate dietitian and pharmacy help -Continue PPI. -PT/OT  AKI: Resolved. -Avoid nephrotoxic meds.  Severe protein calorie malnutrition: Improving. -Work-up for malignancy as above -Continue TPN-per nutrition and pharmacy -Monitor electrolytes  Normocytic anemia: Likely anemia of chronic disease. -Continue monitoring  Scheduled Meds: . heparin injection (subcutaneous)  5,000 Units Subcutaneous Q8H  . pantoprazole (PROTONIX) IV  40 mg Intravenous Q12H  . sodium chloride flush  10-40 mL Intracatheter Q12H  . sodium chloride flush  5 mL Intracatheter Q8H   Continuous Infusions: . sodium chloride 50 mL/hr at 11/06/18 1100  . piperacillin-tazobactam (ZOSYN)  IV Stopped (11/06/18 0959)  . TPN ADULT (ION) 80 mL/hr at 11/06/18 1100  . TPN ADULT (ION)     PRN Meds:.Melatonin, sodium chloride flush  DVT prophylaxis: Subcu Lovenox Code Status: Full code  Family Communication: None at bedside Disposition Plan: Remains inpatient  Consultants:    None at surgery  Procedures:   NG tube  Antimicrobials:  None  Objective: Vitals:   11/05/18 2056 11/05/18 2115 11/06/18 0446 11/06/18 1348  BP: 132/67  (!) 156/83 125/74  Pulse: 90  89 87  Resp: (!) 25 20 20 16   Temp: 99.1 F (37.3 C)  99 F (37.2 C) 98.2 F (36.8 C)  TempSrc: Oral  Oral Oral  SpO2: 99%  100% 100%  Weight:      Height:        Intake/Output Summary (Last 24 hours) at 11/06/2018 1434 Last data filed at 11/06/2018 1357 Gross per 24 hour  Intake 2874.55 ml  Output 2625 ml  Net 249.55 ml   Filed Weights   10/25/18 2218 10/26/18 1258 11/02/18 1006  Weight: 54 kg 54.8 kg 57.1 kg    Examination:  GENERAL: Looks chronically ill.  No acute distress. HEENT: MMM.  Vision and Hearing grossly intact.  Significant temporal muscle wasting. NECK: Supple.  No JVD.  LUNGS:  No IWOB. Good air movement. CTAB.  HEART:  RRR. Heart sounds normal.  ABD: Bowel sounds present. Soft. Non tender.  2 drains in place.  Still with purulent drainage. EXT:   no edema bilaterally.  Significant muscle mass wasting SKIN: no apparent skin lesion.  NEURO: Awake, alert and oriented appropriately.  No gross deficit.  PSYCH: Calm. Normal affect. Data Reviewed: I have independently reviewed following labs and imaging studies   CBC: Recent Labs  Lab 11/03/18 0408 11/04/18 0806 11/05/18 0429  WBC 3.4* 4.5 5.1  NEUTROABS  --   --  2.7  HGB 9.5* 9.4* 9.1*  HCT 29.8* 28.9* 28.2*  MCV 103.8* 104.0* 102.5*  PLT 308 303 280   Basic Metabolic Panel: Recent Labs  Lab 10/31/18 0443 11/01/18 0400 11/03/18 0408 11/04/18 0447 11/05/18 0429  NA 138 135 135 137 137  K 4.1 4.2 4.1 4.0 3.8  CL 107 104 101 104 104  CO2 26 27 28 28 29   GLUCOSE 107* 110* 116* 117* 114*  BUN 17 18 20 19 17   CREATININE 0.72 0.74 0.81 0.67 0.73  CALCIUM 7.8* 7.8* 7.9* 8.0* 8.0*  MG 1.9 2.0 1.9 2.0 1.9  PHOS 3.4 3.4 3.9 3.4 3.4   GFR: Estimated Creatinine Clearance: 75.3 mL/min (by C-G formula  based on SCr of 0.73 mg/dL). Liver Function Tests: Recent Labs  Lab 11/01/18 0400 11/05/18 0429  AST 29 29  ALT 24 23  ALKPHOS 124 152*  BILITOT 0.8 0.3  PROT 6.5 6.4*  ALBUMIN 1.7* 1.7*   No results for input(s): LIPASE, AMYLASE in the last 168 hours. No results for input(s): AMMONIA in the last 168 hours. Coagulation Profile: No results for input(s): INR, PROTIME in the last 168 hours. Cardiac Enzymes: No results for input(s): CKTOTAL, CKMB, CKMBINDEX, TROPONINI in the last 168 hours. BNP (last 3 results) No results for input(s): PROBNP in the last 8760 hours. HbA1C: No results for input(s): HGBA1C in the last 72 hours. CBG: Recent Labs  Lab 11/04/18 0629 11/04/18 1139 11/04/18 1803 11/05/18 0003 11/05/18 0652  GLUCAP 116* 107* 110* 105* 103*   Lipid Profile: Recent Labs    11/05/18 0429  TRIG 89   Thyroid Function Tests: No results for input(s): TSH, T4TOTAL, FREET4, T3FREE, THYROIDAB in the last 72 hours. Anemia Panel: No results for input(s): VITAMINB12, FOLATE, FERRITIN, TIBC, IRON, RETICCTPCT in the last 72 hours. Urine analysis:  Component Value Date/Time   COLORURINE AMBER (A) 10/25/2018 1816   APPEARANCEUR CLEAR 10/25/2018 1816   LABSPEC 1.020 10/25/2018 1816   PHURINE 5.5 10/25/2018 1816   GLUCOSEU NEGATIVE 10/25/2018 1816   HGBUR NEGATIVE 10/25/2018 1816   BILIRUBINUR NEGATIVE 10/25/2018 1816   KETONESUR NEGATIVE 10/25/2018 1816   PROTEINUR NEGATIVE 10/25/2018 1816   NITRITE NEGATIVE 10/25/2018 1816   LEUKOCYTESUR NEGATIVE 10/25/2018 1816   Sepsis Labs: Invalid input(s): PROCALCITONIN, LACTICIDVEN  Recent Results (from the past 240 hour(s))  Aerobic/Anaerobic Culture (surgical/deep wound)     Status: None (Preliminary result)   Collection Time: 11/02/18  4:42 PM  Result Value Ref Range Status   Specimen Description   Final    ABSCESS Performed at [facility], [address], [place], [place] [address]    Special  Requests INTRA ABDOMINAL  Final   Gram Stain   Final    ABUNDANT WBC PRESENT, PREDOMINANTLY PMN ABUNDANT GRAM POSITIVE COCCI Performed at [facility], [address] [place], [place] [address]    Culture CULTURE REINCUBATED FOR BETTER GROWTH  Final   Report Status PENDING  Incomplete      Radiology Studies: No results found.  Ziona Wickens T. [facility] Pager [phone]  If 7PM-7AM, please contact night-coverage [email or web address] Password [facility] 11/06/2018, 2:34 PM

## 2018-11-06 NOTE — Progress Notes (Signed)
   Subjective/Chief Complaint: No complaints. Denies abd pain   Objective: Vital signs in last 24 hours: Temp:  [98.5 F (36.9 C)-99.1 F (37.3 C)] 99 F (37.2 C) (02/25 0446) Pulse Rate:  [76-90] 89 (02/25 0446) Resp:  [19-25] 20 (02/25 0446) BP: (132-156)/(64-83) 156/83 (02/25 0446) SpO2:  [99 %-100 %] 100 % (02/25 0446) Last BM Date: 11/04/18  Intake/Output from previous day: 02/24 0701 - 02/25 0700 In: 3455.3 [P.O.:60; I.V.:3245.1; IV Piggyback:150.2] Out: 2725 [Urine:1375; Emesis/NG output:1300; Drains:50] Intake/Output this shift: No intake/output data recorded.  General appearance: alert and cooperative  GI: soft, nontender. drain output purulent  Lab Results:  Recent Labs    11/04/18 0806 11/05/18 0429  WBC 4.5 5.1  HGB 9.4* 9.1*  HCT 28.9* 28.2*  PLT 303 280   BMET Recent Labs    11/04/18 0447 11/05/18 0429  NA 137 137  K 4.0 3.8  CL 104 104  CO2 28 29  GLUCOSE 117* 114*  BUN 19 17  CREATININE 0.67 0.73  CALCIUM 8.0* 8.0*   PT/INR No results for input(s): LABPROT, INR in the last 72 hours. ABG No results for input(s): PHART, HCO3 in the last 72 hours.  Invalid input(s): PCO2, PO2  Studies/Results: No results found.  Anti-infectives: Anti-infectives (From admission, onward)   Start     Dose/Rate Route Frequency Ordered Stop   10/26/18 0400  piperacillin-tazobactam (ZOSYN) IVPB 3.375 g     3.375 g 12.5 mL/hr over 240 Minutes Intravenous Every 8 hours 10/26/18 0245     10/25/18 1930  piperacillin-tazobactam (ZOSYN) IVPB 3.375 g     3.375 g 100 mL/hr over 30 Minutes Intravenous  Once 10/25/18 1928 10/25/18 2021      Assessment/Plan: s/p Procedure(s): EXPLORATORY  LAPAROSCOPY POSSIBLE LAPAROTOMY (N/A) Continue ng and bowel rest  Hx of perforated gastric ulcer - s/p repair in 2003 Hx of medication non-compliance AKI- improving   Free air with ascites and thickened bowel loops -Benign exam and no signs of sepsis - this is  likely subacute - this could be secondary to obstruction vs recurrent ulcer diseasevs perforated gastric cancer? - CA 19-9 elevated - patient is significantly malnourished with temporal wasting - needs TPN and nutritional support prior to any operative intervention - continue PPIBID, TPN and NPO - UGI study showed no definitive leak, free air still seen - MR abdomen shows gastric thickening - GI aware of pt to consider endoscopy to r/o malignancy  Severe protein calorie malnutrition-  continue TPN and NG for now  FEN: NPO, TPN, IVF; protonixBID VTE: SCDs ID: [email or web address] 2/13>>  CA 19-9 elevated, needs further workup.  GI aware Continue NGT and TPN for now. Protonix BID. Cont IR drains and abx   LOS: 12 days    Vanita Panda 11/06/2018

## 2018-11-06 NOTE — Progress Notes (Signed)
[facility] Gastroenterology Progress Note  Vincent Park 63 y.o. 02-Oct-1955  CC: Abnormal imaging   Subjective: No acute events overnight.  Abdominal pain improving.  Patient denies nausea vomiting.  He denies bleeding.   Objective: Vital signs in last 24 hours: Vitals:   11/05/18 2115 11/06/18 0446  BP:  (!) 156/83  Pulse:  89  Resp: 20 20  Temp:  99 F (37.2 C)  SpO2:  100%    Physical Exam:  General.  Thin patient.  Not in acute distress. Abdomen.  Soft, nontender, nondistended, bowel sounds present.  bilateral  drain with purulent output.  Lab Results: Recent Labs    11/04/18 0447 11/05/18 0429  NA 137 137  K 4.0 3.8  CL 104 104  CO2 28 29  GLUCOSE 117* 114*  BUN 19 17  CREATININE 0.67 0.73  CALCIUM 8.0* 8.0*  MG 2.0 1.9  PHOS 3.4 3.4   Recent Labs    11/05/18 0429  AST 29  ALT 23  ALKPHOS 152*  BILITOT 0.3  PROT 6.4*  ALBUMIN 1.7*   Recent Labs    11/04/18 0806 11/05/18 0429  WBC 4.5 5.1  NEUTROABS  --  2.7  HGB 9.4* 9.1*  HCT 28.9* 28.2*  MCV 104.0* 102.5*  PLT 303 280   No results for input(s): LABPROT, INR in the last 72 hours.    Assessment/Plan: -Abnormal imaging concerning for gastric ulcer ( ??  Gastric malignancy)  with microperforation, intraperitoneal free air and ascites.  MRI showed masslike thickening of the proximal wall of the stomach. -Abdominal flank abscess status post IR guided drain placement. -Elevated CA 19-9  -Small bowel obstruction.  Recommendations ----------------------- -Continue supportive care for now.  - IR has recommended repeat CT scan once the drain output has decreased.   - Consider endoscopy after repeat CT scan. Air  inflation of the stomach during endoscopy may make perforation worse and  it will make his symptoms of small bowel obstruction worse.  -  GI will follow   Kathi Der MD, FACP 11/06/2018, 12:08 PM  Contact #  [phone]

## 2018-11-07 LAB — CBC
HCT: 29.2 % — ABNORMAL LOW (ref 39.0–52.0)
Hemoglobin: 9.6 g/dL — ABNORMAL LOW (ref 13.0–17.0)
MCH: 34 pg (ref 26.0–34.0)
MCHC: 32.9 g/dL (ref 30.0–36.0)
MCV: 103.5 fL — ABNORMAL HIGH (ref 80.0–100.0)
Platelets: 308 10*3/uL (ref 150–400)
RBC: 2.82 MIL/uL — AB (ref 4.22–5.81)
RDW: 13.8 % (ref 11.5–15.5)
WBC: 6.8 10*3/uL (ref 4.0–10.5)
nRBC: 0 % (ref 0.0–0.2)

## 2018-11-07 LAB — AEROBIC/ANAEROBIC CULTURE W GRAM STAIN (SURGICAL/DEEP WOUND)

## 2018-11-07 LAB — BASIC METABOLIC PANEL
Anion gap: 3 — ABNORMAL LOW (ref 5–15)
BUN: 18 mg/dL (ref 8–23)
CO2: 30 mmol/L (ref 22–32)
Calcium: 8.3 mg/dL — ABNORMAL LOW (ref 8.9–10.3)
Chloride: 102 mmol/L (ref 98–111)
Creatinine, Ser: 0.72 mg/dL (ref 0.61–1.24)
GFR calc Af Amer: >60 mL/min (ref >60)
GFR calc non Af Amer: >60 mL/min (ref >60)
Glucose, Bld: 114 mg/dL — ABNORMAL HIGH (ref 70–99)
POTASSIUM: 3.8 mmol/L (ref 3.5–5.1)
SODIUM: 135 mmol/L (ref 135–145)

## 2018-11-07 LAB — AEROBIC/ANAEROBIC CULTURE (SURGICAL/DEEP WOUND)

## 2018-11-07 MED ORDER — TRAVASOL 10 % IV SOLN
INTRAVENOUS | Status: AC
  Administered 2018-11-07: 18:00:00 via INTRAVENOUS
  Filled 2018-11-07: qty 1056

## 2018-11-07 NOTE — Progress Notes (Signed)
[facility] Gastroenterology Progress Note  Vincent Park 63 y.o. 1956/03/18  CC: Abnormal imaging   Subjective: Continues to feel better.  Drain output decreasing remains purulent.   Objective: Vital signs in last 24 hours: Vitals:   11/06/18 2022 11/07/18 0617  BP: (!) 99/56 139/75  Pulse: 90 86  Resp: 18 18  Temp: 99 F (37.2 C) 98 F (36.7 C)  SpO2: 100% 96%    Physical Exam:  General.  Thin patient.  Not in acute distress. Abdomen.  Soft, nontender, nondistended, bowel sounds present.  bilateral  drain with purulent output.  Lab Results: Recent Labs    11/05/18 0429 11/07/18 0520  NA 137 135  K 3.8 3.8  CL 104 102  CO2 29 30  GLUCOSE 114* 114*  BUN 17 18  CREATININE 0.73 0.72  CALCIUM 8.0* 8.3*  MG 1.9  --   PHOS 3.4  --    Recent Labs    11/05/18 0429  AST 29  ALT 23  ALKPHOS 152*  BILITOT 0.3  PROT 6.4*  ALBUMIN 1.7*   Recent Labs    11/05/18 0429 11/07/18 0520  WBC 5.1 6.8  NEUTROABS 2.7  --   HGB 9.1* 9.6*  HCT 28.2* 29.2*  MCV 102.5* 103.5*  PLT 280 308   No results for input(s): LABPROT, INR in the last 72 hours.    Assessment/Plan: -Abnormal imaging concerning for gastric ulcer ( ??  Gastric malignancy)  with microperforation, intraperitoneal free air and ascites.  MRI showed masslike thickening of the proximal wall of the stomach. -Abdominal flank abscess status post IR guided drain placement. -Elevated CA 19-9  -Small bowel obstruction.  Recommendations ----------------------- -Continue supportive care for now.  - IR has recommended repeat CT scan once the drain output has decreased.   -Check x-ray abdomen in the morning. - Tentative plan for EGD on Friday. -  GI will follow   Kathi Der MD, FACP 11/07/2018, 11:21 AM  Contact #  [phone]

## 2018-11-07 NOTE — Progress Notes (Signed)
    Referring Physician(s): Gonfa,T  Supervising Physician: Irish Lack  Patient Status:  [facility] - In-pt  Chief Complaint: S/P [place] bilateral abdominal flank drain placement  Subjective: Patient is doing well and does not complain of any abdominal pain. Patient denies nausea, vomiting, fever, headache, and chest pain. Patient denies pain at drain sites.    Allergies: Patient has no known allergies.  Medications: Prior to Admission medications   Medication Sig Start Date End Date Taking? Authorizing Provider  Alum & Mag Hydroxide-Simeth (GI COCKTAIL) SUSP suspension Take 30 mLs by mouth 2 (two) times daily. Shake well. 10/10/18  Yes Gwyneth Sprout, MD  omeprazole (PRILOSEC) 20 MG capsule Take 1 capsule (20 mg total) by mouth daily. 10/10/18  Yes Gwyneth Sprout, MD     Vital Signs: BP 128/78 (BP Location: Left Arm)   Pulse 88   Temp 98.3 F (36.8 C) (Oral)   Resp 18   Ht 6\' 2"  (1.88 m)   Wt 125 lb 14.1 oz (57.1 kg)   SpO2 100%   BMI 16.16 kg/m   Physical Exam  Patient is awake and alert; resting comfortably in bed. Both drain sites are clean and dry, without erythema or warmth to touch. Dressings around both sides have been recently changed and are clean. Abdomen is soft and non-distended, and no tenderness to palpation. Drains still producing some purulent fluid. Drain output has decreased and a total of 15 cc fluid has been drained today.  Imaging: No results found.  Labs:  CBC: Recent Labs    11/03/18 0408 11/04/18 0806 11/05/18 0429 11/07/18 0520  WBC 3.4* 4.5 5.1 6.8  HGB 9.5* 9.4* 9.1* 9.6*  HCT 29.8* 28.9* 28.2* 29.2*  PLT 308 303 280 308    COAGS: Recent Labs    10/26/18 0500  INR 1.13    BMP: Recent Labs    11/03/18 0408 11/04/18 0447 11/05/18 0429 11/07/18 0520  NA 135 137 137 135  K 4.1 4.0 3.8 3.8  CL 101 104 104 102  CO2 28 28 29 30   GLUCOSE 116* 117* 114* 114*  BUN 20 19 17 18   CALCIUM 7.9* 8.0* 8.0* 8.3*  CREATININE 0.81  0.67 0.73 0.72  GFRNONAA >60 >60 >60 >60  GFRAA >60 >60 >60 >60    LIVER FUNCTION TESTS: Recent Labs    10/27/18 0542 10/29/18 0406 11/01/18 0400 11/05/18 0429  BILITOT 0.5 0.1* 0.8 0.3  AST 39 34 29 29  ALT 29 27 24 23   ALKPHOS 97 98 124 152*  PROT 5.8* 5.9* 6.5 6.4*  ALBUMIN 1.7* 1.7* 1.7* 1.7*    Assessment and Plan: S/P [place] bilateral abdominal flank drains; labs are stable; 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 Jeananne Rama, PA-C/Maxwell Dennie Bible, PA-S 11/07/2018, 3:39 PM   I spent a total of 15 at the the patient's bedside AND on the patient's hospital floor or unit, greater than 50% of which was counseling/coordinating care for s/p [place] bilateral abdominal flank drains    Patient ID: Vincent Park, male   DOB: 1956/02/03, 63 y.o.   MRN: [id number]

## 2018-11-07 NOTE — Progress Notes (Signed)
   Subjective/Chief Complaint: No complaints. Denies abd pain   Objective: Vital signs in last 24 hours: Temp:  [98 F (36.7 C)-99 F (37.2 C)] 98 F (36.7 C) (02/26 0617) Pulse Rate:  [86-90] 86 (02/26 0617) Resp:  [16-18] 18 (02/26 0617) BP: (99-139)/(56-75) 139/75 (02/26 0617) SpO2:  [96 %-100 %] 96 % (02/26 0617) Last BM Date: 11/04/18  Intake/Output from previous day: 02/25 0701 - 02/26 0700 In: 3198.1 [P.O.:400; I.V.:2553.7; IV Piggyback:209.4] Out: 3570 [Urine:2000; Emesis/NG output:1400; Drains:170] Intake/Output this shift: No intake/output data recorded.  General appearance: alert and cooperative  GI: soft, nontender. drain output purulent  Lab Results:  Recent Labs    11/05/18 0429 11/07/18 0520  WBC 5.1 6.8  HGB 9.1* 9.6*  HCT 28.2* 29.2*  PLT 280 308   BMET Recent Labs    11/05/18 0429 11/07/18 0520  NA 137 135  K 3.8 3.8  CL 104 102  CO2 29 30  GLUCOSE 114* 114*  BUN 17 18  CREATININE 0.73 0.72  CALCIUM 8.0* 8.3*   PT/INR No results for input(s): LABPROT, INR in the last 72 hours. ABG No results for input(s): PHART, HCO3 in the last 72 hours.  Invalid input(s): PCO2, PO2  Studies/Results: No results found.  Anti-infectives: Anti-infectives (From admission, onward)   Start     Dose/Rate Route Frequency Ordered Stop   10/26/18 0400  piperacillin-tazobactam (ZOSYN) IVPB 3.375 g     3.375 g 12.5 mL/hr over 240 Minutes Intravenous Every 8 hours 10/26/18 0245     10/25/18 1930  piperacillin-tazobactam (ZOSYN) IVPB 3.375 g     3.375 g 100 mL/hr over 30 Minutes Intravenous  Once 10/25/18 1928 10/25/18 2021      Assessment/Plan: s/p Procedure(s): EXPLORATORY  LAPAROSCOPY POSSIBLE LAPAROTOMY (N/A) Continue ng and bowel rest  Hx of perforated gastric ulcer - s/p repair in 2003 Hx of medication non-compliance AKI- improving   Free air with ascites and thickened bowel loops -Benign exam and no signs of sepsis - this is likely  subacute - this could be secondary to obstruction vs recurrent ulcer diseasevs perforated gastric cancer? - CA 19-9 elevated - patient is significantly malnourished with temporal wasting - needs TPN and nutritional support prior to any operative intervention - continue PPIBID, TPN and NPO - UGI study showed no definitive leak, free air still seen - MR abdomen shows gastric thickening - GI aware of pt to consider endoscopy to r/o malignancy  Severe protein calorie malnutrition-  continue TPN and NG for now  FEN: NPO, TPN, IVF; protonixBID VTE: SCDs ID: zosyn 2/13>>  CA 19-9 elevated, needs further workup.  GI aware.  No further surgical needs until EGD complete Continue NGT and TPN for now. Protonix BID. Cont IR drains and abx Will recheck on Fri   LOS: 13 days    Vanita Panda 11/07/2018

## 2018-11-07 NOTE — Progress Notes (Signed)
PHARMACY - ADULT TOTAL PARENTERAL NUTRITION CONSULT NOTE   Pharmacy Consult for TPN Indication: bowel rest, severe malnutrition PTA  Patient Measurements: Height: 6' 2"  (188 cm) Weight: 125 lb 14.1 oz (57.1 kg) IBW/kg (Calculated) : 82.2 TPN AdjBW (KG): 54 Body mass index is 16.16 kg/m. Usual Weight: unknown as patient is poor historian, but reports significant weight loss over the past year  HPI: 39 yoM with PMH PUD with perforated gastric ulcer s/p repair in 2003, GERD presents with worsening chronic abdominal pain and associated weight loss. Cachectic appearing. CT reveals ascites with concern for SBO vs recurrent ulcer disease. Pt is NPO. Consulted by surgery for TPN management. Pt will require nutritional support prior to any surgical intervention.  Central access: Double lumen PICC placed 2/14 TPN start date: 2/14  Significant events:  2/23: per [facility], continue NGT, NPO and TPN today  ASSESSMENT                                                                                               Insulin requirements: none  Current Nutrition: NPO except for ice chips  IVF: NS at 50 mL/hr  Today, 11/07/2018:  Glucose (CBG goal 100-150) - SSI/CBGs dc'd 2/24  Electrolytes -WNL  Renal - Mild AKI on admission - resolved. SCr now WNL  LFTs, Alk Phos - WNL. Albumin (1.7) low  TGs - WNL (120, 2/15), (117, 2/17)  89 2/24  Prealbumin - <5  2/14, 5.7 2/17, 10.2 2/24 - coming up on TPN  NUTRITIONAL GOALS                                                                                 RD recs (per note on 2/14): Kcal: 1900 - 2100 grams Protein: 95 - 115 g Fluid: >/= 1.9 L/day  Custom TPN at goal rate of 80 mL/hr will provide: 2070 kcal, 105 g protein, and 326 g dextrose, meeting 100% of patient needs    PLAN                                                                                                               At 1800 today:  Continue TPN at goal rate of 80 mL/hr providing  100 % of patient needs with 2070 kcal and 105 g protein  Continue with standard electrolytes with increase Mg  slightly to 7.5 meq/L. Cl:Ac 1:2.   continue IVF to 50 mL/hr  TPN lab panels on Mondays & Thursdays  Eudelia Bunch, Pharm.D [phone] 11/07/2018 7:12 AM

## 2018-11-07 NOTE — Progress Notes (Signed)
PROGRESS NOTE    Vincent Park  XKG:[id number] DOB: Jan 18, 1956 DOA: 10/25/2018 PCP: Patient, No Pcp Per  Brief Narrative: 63 year old [place] male with history of perforated ulcer in 2003 status post repair, reflux, severe protein calorie malnutrition admitted 2/13 with abdominal pain for 2 weeks.  Patient recently moved from [place] to [place].  Has no PCP.  Has not seen a physician in years.  In ED, CT abdomen showed dilated thickened small bowel with transition point for obstruction with free air concerning for pneumoperitoneum. General surgery consulted manage conservatively with NG tube decompression and empiric antibiotic (Zosyn 2/14-->).  Patient had a single column upper GI series performed on 2/19 that showed large ulcer without definite leak.   Bowel obstruction, history of gastric ulcer and significant muscle mass wasting raised concern for undiagnosed malignancy.  Cancer markers including CA-125, CEA and CA 19-9 ordered.  The later elevated to 202.  MRI abdomen on 2/21 showed multiple intra-abdominal abscesses, 15.1 x 8.3 x 21 cm on the left abdomen and 8.3 x 14.8 x 21 cm in the right abdomen.  There was also a masslike nonspecific wall thickening involving the proximal stomach.  IR consulted and drained the abscesses and placed drains.  Gram stain positive for GPC's.  Culture negative.  [facility] consulted on 2/22 and following.   In regards to malnutrition, started on TPN with the guidance of nutrition and pharmacy.  NG tube retained per general surgery recommendation. Assessment & Plan:   Principal Problem:   SBO (small bowel obstruction) (HCC) Active Problems:   AKI (acute kidney injury) (HCC)   Cachexia (HCC)   PUD (peptic ulcer disease)   Moderate alcohol consumption   GERD (gastroesophageal reflux disease)   Pneumoperitoneum   Protein-calorie malnutrition, severe (HCC)   Intra-abdominal abscess (HCC)  SBO/pneumoperitoneum/intra-abdominal abscesses/ascites:  -NG  remains in place per general surgery recommendation.  Still with significant output. -CT abdomen and pelvis on 10/25/2018 concerning for high-grade SBO, partial malrotation of small bowel, significant ascites, free intraperitoneal air and peritoneal enhancement.  General surgery following. -Abdominal x-ray on 2/14 showed SBO with ascites again. -UGI on 2/19 showed large ulcer without definite leak -CEA and CEA from 125 within normal range.  CA 19-9 elevated to 202. -MRI abdomen on 2/21 showed multiple intra-abdominal abscesses and nonspecific proximal stomach wall thickening -Abscesses drained and drains placed on 2/21 by IR.  Gram stain positive for GPC's.  Culture negative. -[facility] following -Zosyn 2/14--> -Continue TPN-appreciate dietitian and pharmacy help -Continue PPI. -PT/OT  AKI: Resolved. -Avoid nephrotoxic meds.   Severe protein calorie malnutrition: Improving. -Continue TPN-per nutrition and pharmacy -Monitor electrolytes  Normocytic anemia: Likely anemia of chronic disease. -Continue monitoring  Nutrition Problem: Severe Malnutrition Etiology: chronic illness(PUD with surgery)     Signs/Symptoms: energy intake < or equal to 75% for > or equal to 1 month, severe fat depletion, severe muscle depletion, percent weight loss    Interventions: TPN  Estimated body mass index is 16.16 kg/m as calculated from the following:   Height as of this encounter: 6\' 2"  (1.88 m).   Weight as of this encounter: 57.1 kg.  DVT prophylaxis: Lovenox Code Status: Full code Family Communication: No family available disposition Plan: Unknown  Consultants:   General surgery and GI and interventional radiology  Procedures: NG tube  abdominal drains 11/02/2018 Antimicrobials: Zosyn  Subjective: Patient resting in bed remains on TPN NG tube  Objective: Elderly frail chronically ill looking male Vitals:   11/06/18 0446 11/06/18 1348  11/06/18 2022 11/07/18 0617  BP: (!)  156/83 125/74 (!) 99/56 139/75  Pulse: 89 87 90 86  Resp: 20 16 18 18   Temp: 99 F (37.2 C) 98.2 F (36.8 C) 99 F (37.2 C) 98 F (36.7 C)  TempSrc: Oral Oral Oral Oral  SpO2: 100% 100% 100% 96%  Weight:      Height:        Intake/Output Summary (Last 24 hours) at 11/07/2018 0858 Last data filed at 11/07/2018 0648 Gross per 24 hour  Intake 3198.07 ml  Output 3570 ml  Net -371.93 ml   Filed Weights   10/25/18 2218 10/26/18 1258 11/02/18 1006  Weight: 54 kg 54.8 kg 57.1 kg    Examination: Foley catheter NG tube and 2 abdominal drains in place. General exam: Appears calm and comfortable  Respiratory system: Clear to auscultation. Respiratory effort normal. Cardiovascular system: S1 & S2 heard, RRR. No JVD, murmurs, rubs, gallops or clicks. No pedal edema. Gastrointestinal system: Abdomen is nondistended, soft and nontender. No organomegaly or masses felt. Normal bowel sounds heard.  2 drains present 1 on each side of the abdomen with purulent drainage. Central nervous system: Alert and oriented. No focal neurological deficits. Extremities: Symmetric 5 x 5 power. Skin: No rashes, lesions or ulcers Psychiatry: Judgement and insight appear normal. Mood & affect appropriate.     Data Reviewed: I have personally reviewed following labs and imaging studies  CBC: Recent Labs  Lab 11/03/18 0408 11/04/18 0806 11/05/18 0429 11/07/18 0520  WBC 3.4* 4.5 5.1 6.8  NEUTROABS  --   --  2.7  --   HGB 9.5* 9.4* 9.1* 9.6*  HCT 29.8* 28.9* 28.2* 29.2*  MCV 103.8* 104.0* 102.5* 103.5*  PLT 308 303 280 308   Basic Metabolic Panel: Recent Labs  Lab 11/01/18 0400 11/03/18 0408 11/04/18 0447 11/05/18 0429 11/07/18 0520  NA 135 135 137 137 135  K 4.2 4.1 4.0 3.8 3.8  CL 104 101 104 104 102  CO2 27 28 28 29 30   GLUCOSE 110* 116* 117* 114* 114*  BUN 18 20 19 17 18   CREATININE 0.74 0.81 0.67 0.73 0.72  CALCIUM 7.8* 7.9* 8.0* 8.0* 8.3*  MG 2.0 1.9 2.0 1.9  --   PHOS 3.4 3.9 3.4  3.4  --    GFR: Estimated Creatinine Clearance: 75.3 mL/min (by C-G formula based on SCr of 0.72 mg/dL). Liver Function Tests: Recent Labs  Lab 11/01/18 0400 11/05/18 0429  AST 29 29  ALT 24 23  ALKPHOS 124 152*  BILITOT 0.8 0.3  PROT 6.5 6.4*  ALBUMIN 1.7* 1.7*   No results for input(s): LIPASE, AMYLASE in the last 168 hours. No results for input(s): AMMONIA in the last 168 hours. Coagulation Profile: No results for input(s): INR, PROTIME in the last 168 hours. Cardiac Enzymes: No results for input(s): CKTOTAL, CKMB, CKMBINDEX, TROPONINI in the last 168 hours. BNP (last 3 results) No results for input(s): PROBNP in the last 8760 hours. HbA1C: No results for input(s): HGBA1C in the last 72 hours. CBG: Recent Labs  Lab 11/04/18 1139 11/04/18 1803 11/05/18 0003 11/05/18 0652 11/06/18 2351  GLUCAP 107* 110* 105* 103* 105*   Lipid Profile: Recent Labs    11/05/18 0429  TRIG 89   Thyroid Function Tests: No results for input(s): TSH, T4TOTAL, FREET4, T3FREE, THYROIDAB in the last 72 hours. Anemia Panel: No results for input(s): VITAMINB12, FOLATE, FERRITIN, TIBC, IRON, RETICCTPCT in the last 72 hours. Sepsis Labs: No results for input(s):  PROCALCITON, LATICACIDVEN in the last 168 hours.  Recent Results (from the past 240 hour(s))  Aerobic/Anaerobic Culture (surgical/deep wound)     Status: None (Preliminary result)   Collection Time: 11/02/18  4:42 PM  Result Value Ref Range Status   Specimen Description   Final    ABSCESS Performed at [facility], [address] [place], [place] [address]    Special Requests INTRA ABDOMINAL  Final   Gram Stain   Final    ABUNDANT WBC PRESENT, PREDOMINANTLY PMN ABUNDANT GRAM POSITIVE COCCI Performed at [facility], [address] [place], [place] [address]    Culture CULTURE REINCUBATED FOR BETTER GROWTH  Final   Report Status PENDING  Incomplete         Radiology Studies: No results  found.      Scheduled Meds: . heparin injection (subcutaneous)  5,000 Units Subcutaneous Q8H  . pantoprazole (PROTONIX) IV  40 mg Intravenous Q12H  . sodium chloride flush  10-40 mL Intracatheter Q12H  . sodium chloride flush  5 mL Intracatheter Q8H   Continuous Infusions: . sodium chloride 50 mL/hr at 11/06/18 1800  . piperacillin-tazobactam (ZOSYN)  IV 3.375 g (11/07/18 0533)  . TPN ADULT (ION) 80 mL/hr at 11/06/18 1800  . TPN ADULT (ION)       LOS: 13 days     Alwyn RenElizabeth G , MD [facility]  If 7PM-7AM, please contact night-coverage [email or web address] Password [facility] 11/07/2018, 8:58 AM

## 2018-11-08 ENCOUNTER — Inpatient Hospital Stay (HOSPITAL_COMMUNITY): Payer: Self-pay

## 2018-11-08 ENCOUNTER — Encounter (HOSPITAL_COMMUNITY): Payer: Self-pay | Admitting: Radiology

## 2018-11-08 LAB — PHOSPHORUS: Phosphorus: 4.3 mg/dL (ref 2.5–4.6)

## 2018-11-08 LAB — PROTIME-INR
INR: 1 (ref 0.8–1.2)
Prothrombin Time: 12.9 seconds (ref 11.4–15.2)

## 2018-11-08 LAB — COMPREHENSIVE METABOLIC PANEL
ALK PHOS: 178 U/L — AB (ref 38–126)
ALT: 25 U/L (ref 0–44)
AST: 28 U/L (ref 15–41)
Albumin: 2 g/dL — ABNORMAL LOW (ref 3.5–5.0)
Anion gap: 5 (ref 5–15)
BUN: 19 mg/dL (ref 8–23)
CO2: 29 mmol/L (ref 22–32)
Calcium: 8.5 mg/dL — ABNORMAL LOW (ref 8.9–10.3)
Chloride: 102 mmol/L (ref 98–111)
Creatinine, Ser: 0.73 mg/dL (ref 0.61–1.24)
GFR calc Af Amer: >60 mL/min (ref >60)
GFR calc non Af Amer: >60 mL/min (ref >60)
Glucose, Bld: 108 mg/dL — ABNORMAL HIGH (ref 70–99)
Potassium: 4.1 mmol/L (ref 3.5–5.1)
Sodium: 136 mmol/L (ref 135–145)
Total Bilirubin: 0.3 mg/dL (ref 0.3–1.2)
Total Protein: 7.3 g/dL (ref 6.5–8.1)

## 2018-11-08 LAB — MAGNESIUM: Magnesium: 1.9 mg/dL (ref 1.7–2.4)

## 2018-11-08 MED ORDER — IOHEXOL 300 MG/ML  SOLN
100.0000 mL | Freq: Once | INTRAMUSCULAR | Status: AC | PRN
  Administered 2018-11-08: 100 mL via INTRAVENOUS

## 2018-11-08 MED ORDER — CEFAZOLIN SODIUM-DEXTROSE 2-4 GM/100ML-% IV SOLN
2.0000 g | INTRAVENOUS | Status: AC
  Administered 2018-11-09: 2 g via INTRAVENOUS

## 2018-11-08 MED ORDER — TRAVASOL 10 % IV SOLN
INTRAVENOUS | Status: AC
  Administered 2018-11-08: 17:00:00 via INTRAVENOUS
  Filled 2018-11-08: qty 1056

## 2018-11-08 MED ORDER — HEPARIN SODIUM (PORCINE) 5000 UNIT/ML IJ SOLN: 5000.0000 [IU] | Freq: Three times a day (TID) | INTRAMUSCULAR | Status: DC

## 2018-11-08 NOTE — Progress Notes (Signed)
PROGRESS NOTE    Vincent Park  UJW:[id number] DOB: April 02, 1956 DOA: 10/25/2018 PCP: Patient, No Pcp Per   Brief Narrative:63 year old [place] male with history of perforated ulcer in 2003 status post repair, reflux, severe protein calorie malnutrition admitted 2/13 with abdominal pain for 2 weeks. Patient recently moved from [place] to [place]. Has no PCP. Has not seen a physician in years.  In ED, CT abdomen showed dilated thickened small bowel with transition point for obstruction with free air concerning for pneumoperitoneum. General surgery consulted manage conservatively with NG tube decompression and empiric antibiotic (Zosyn 2/14-->). Patient had a single column upper GI series performed on 2/19 that showed large ulcer without definite leak.   Bowel obstruction, history of gastric ulcer and significant muscle mass wasting raised concern for undiagnosed malignancy. Cancer markers including CA-125, CEA and CA 19-9 ordered. The later elevated to 202. MRI abdomen on 2/21 showed multiple intra-abdominal abscesses, 15.1 x 8.3 x 21 cm on the left abdomen and 8.3 x 14.8 x 21 cm in the right abdomen. There was also a masslike nonspecific wall thickening involving the proximal stomach. IR consulted and drained the abscesses and placed drains. Gram stain positive for GPC's. Culture negative. [facility] consulted on 2/22 and following.   In regards to malnutrition, started on TPN with the guidance of nutrition and pharmacy. NG tube retained per general surgery recommendation.  Assessment & Plan:   Principal Problem:   SBO (small bowel obstruction) (HCC) Active Problems:   AKI (acute kidney injury) (HCC)   Cachexia (HCC)   PUD (peptic ulcer disease)   Moderate alcohol consumption   GERD (gastroesophageal reflux disease)   Pneumoperitoneum   Protein-calorie malnutrition, severe (HCC)   Intra-abdominal abscess (HCC)   SBO/pneumoperitoneum/intra-abdominal abscesses/ascites:  -NG  remains in place per general surgery recommendation. Still with significant output. -CT abdomen and pelvis on 10/25/2018 concerning for high-grade SBO, partial malrotation of small bowel, significant ascites, free intraperitoneal air and peritoneal enhancement.General surgery following. -Abdominal x-ray on 2/14 showed SBO with ascites again. -UGI on 2/19 showed large ulcer without definite leak -CEA and CEA from 125 within normal range. CA 19-9 elevated to 202. -MRI abdomen on 2/21 showed multiple intra-abdominal abscesses and nonspecific proximal stomach wall thickening -Abscesses drained and drains placed on 2/21 by IR.  Still draining purulent from both the drains. Gram stain positive for GPC's. Culture negative. -[facility] following -Zosyn 2/14--> -Continue TPN-appreciate dietitian and pharmacy help -Continue PPI. -PT/OT -Patient to have EGD tomorrow most likely to evaluate gastric ulcer question malignancy  AKI: Resolved. -Avoid nephrotoxic meds.   Severe protein calorie malnutrition: Improving. -Continue TPN-per nutrition and pharmacy -Monitor electrolytes  Normocytic anemia:Likely anemia of chronic disease. -Continue monitoring  Nutrition Problem: Severe Malnutrition Etiology: chronic illness(PUD with surgery)  DVT prophylaxis: Lovenox Code Status: Full code Family Communication: No family available disposition Plan: Unknown  Consultants:   General surgery and GI and interventional radiology  Procedures: NG tube  abdominal drains 11/02/2018 Antimicrobials: Zosyn   Nutrition Problem: Severe Malnutrition Etiology: chronic illness(PUD with surgery)     Signs/Symptoms: energy intake < or equal to 75% for > or equal to 1 month, severe fat depletion, severe muscle depletion, percent weight loss    Interventions: TPN  Estimated body mass index is 16.16 kg/m as calculated from the following:   Height as of this encounter:  (1.88 m).   Weight as  of this encounter: 57.1 kg.   Subjective: Patient resting in bed in no acute distress  denies any new complaints today continues to have NG tube in place   Objective: Vitals:   11/07/18 0617 11/07/18 1417 11/07/18 2209 11/08/18 0634  BP: 139/75 128/78 (!) 106/93 130/75  Pulse: 86 88 95 97  Resp: 18 18 20 18   Temp: 98 F (36.7 C) 98.3 F (36.8 C) 98.6 F (37 C) 98.6 F (37 C)  TempSrc: Oral Oral Oral Oral  SpO2: 96% 100% 100% 100%  Weight:      Height:        Intake/Output Summary (Last 24 hours) at 11/08/2018 0848 Last data filed at 11/08/2018 3474 Gross per 24 hour  Intake 1733.86 ml  Output 3315 ml  Net -1581.14 ml   Filed Weights   10/25/18 2218 10/26/18 1258 11/02/18 1006  Weight: 54 kg 54.8 kg 57.1 kg    Examination:  General exam: Appears calm and comfortable  Respiratory system: Clear to auscultation. Respiratory effort normal. Cardiovascular system: S1 & S2 heard, RRR. No JVD, murmurs, rubs, gallops or clicks. No pedal edema. Gastrointestinal system: Abdomen is nondistended, soft and nontender. No organomegaly or masses felt. Normal bowel sounds heard.  2 drains in place one on each side of the abdomen draining purulent material. Central nervous system: Alert and oriented. No focal neurological deficits. Extremities: Symmetric 5 x 5 power. Skin: No rashes, lesions or ulcers Psychiatry: Judgement and insight appear normal. Mood & affect appropriate.     Data Reviewed: I have personally reviewed following labs and imaging studies  CBC: Recent Labs  Lab 11/03/18 0408 11/04/18 0806 11/05/18 0429 11/07/18 0520  WBC 3.4* 4.5 5.1 6.8  NEUTROABS  --   --  2.7  --   HGB 9.5* 9.4* 9.1* 9.6*  HCT 29.8* 28.9* 28.2* 29.2*  MCV 103.8* 104.0* 102.5* 103.5*  PLT 308 303 280 308   Basic Metabolic Panel: Recent Labs  Lab 11/03/18 0408 11/04/18 0447 11/05/18 0429 11/07/18 0520 11/08/18 0349  NA 135 137 137 135 136  K 4.1 4.0 3.8 3.8 4.1  CL 101 104 104 102  102  CO2 28 28 29 30 29   GLUCOSE 116* 117* 114* 114* 108*  BUN 20 19 17 18 19   CREATININE 0.81 0.67 0.73 0.72 0.73  CALCIUM 7.9* 8.0* 8.0* 8.3* 8.5*  MG 1.9 2.0 1.9  --  1.9  PHOS 3.9 3.4 3.4  --  4.3   GFR: Estimated Creatinine Clearance: 75.3 mL/min (by C-G formula based on SCr of 0.73 mg/dL). Liver Function Tests: Recent Labs  Lab 11/05/18 0429 11/08/18 0349  AST 29 28  ALT 23 25  ALKPHOS 152* 178*  BILITOT 0.3 0.3  PROT 6.4* 7.3  ALBUMIN 1.7* 2.0*   No results for input(s): LIPASE, AMYLASE in the last 168 hours. No results for input(s): AMMONIA in the last 168 hours. Coagulation Profile: No results for input(s): INR, PROTIME in the last 168 hours. Cardiac Enzymes: No results for input(s): CKTOTAL, CKMB, CKMBINDEX, TROPONINI in the last 168 hours. BNP (last 3 results) No results for input(s): PROBNP in the last 8760 hours. HbA1C: No results for input(s): HGBA1C in the last 72 hours. CBG: Recent Labs  Lab 11/04/18 1139 11/04/18 1803 11/05/18 0003 11/05/18 0652 11/06/18 2351  GLUCAP 107* 110* 105* 103* 105*   Lipid Profile: No results for input(s): CHOL, HDL, LDLCALC, TRIG, CHOLHDL, LDLDIRECT in the last 72 hours. Thyroid Function Tests: No results for input(s): TSH, T4TOTAL, FREET4, T3FREE, THYROIDAB in the last 72 hours. Anemia Panel: No results for input(s): VITAMINB12, FOLATE,  FERRITIN, TIBC, IRON, RETICCTPCT in the last 72 hours. Sepsis Labs: No results for input(s): PROCALCITON, LATICACIDVEN in the last 168 hours.  Recent Results (from the past 240 hour(s))  Aerobic/Anaerobic Culture (surgical/deep wound)     Status: None   Collection Time: 11/02/18  4:42 PM  Result Value Ref Range Status   Specimen Description   Final    ABSCESS Performed at [facility], [address], [place], [place] [address]    Special Requests INTRA ABDOMINAL  Final   Gram Stain   Final    ABUNDANT WBC PRESENT, PREDOMINANTLY PMN ABUNDANT GRAM POSITIVE  COCCOBACILLUS    Culture   Final    RARE ERYSIPELOTHRIX RHUSIOPATHIAE Standardized susceptibility testing for this organism is not available. NO ANAEROBES ISOLATED Performed at [facility], [address], [place], [place] [address]    Report Status 11/07/2018 FINAL  Final         Radiology Studies: Dg Abd 1 View  Result Date: 11/08/2018 CLINICAL DATA:  Intra-abdominal free air EXAM: ABDOMEN - 1 VIEW COMPARISON:  November 03, 2018 FINDINGS: The bowel gas pattern is normal. Nasogastric tube is identified distal tip probably in the proximal stomach. Free air in the left upper abdomen is unchanged. Changed to in bilateral abdomen are unchanged. IMPRESSION: Persistent free air is not significantly changed. No bowel obstruction. Electronically Signed   By: Sherian Rein M.D.   On: 11/08/2018 07:34        Scheduled Meds: . heparin injection (subcutaneous)  5,000 Units Subcutaneous Q8H  . pantoprazole (PROTONIX) IV  40 mg Intravenous Q12H  . sodium chloride flush  10-40 mL Intracatheter Q12H  . sodium chloride flush  5 mL Intracatheter Q8H   Continuous Infusions: . sodium chloride Stopped (11/07/18 0513)  . piperacillin-tazobactam (ZOSYN)  IV 3.375 g (11/08/18 0648)  . TPN ADULT (ION) 80 mL/hr at 11/08/18 0600  . TPN ADULT (ION)       LOS: 14 days     Alwyn Ren, MD [facility] If 7PM-7AM, please contact night-coverage [email or web address] Password [facility] 11/08/2018, 8:48 AM

## 2018-11-08 NOTE — Progress Notes (Signed)
Nutrition Follow-up  DOCUMENTATION CODES:   Underweight, Severe malnutrition in context of chronic illness  INTERVENTION:    TPN per pharmacy  Monitor for diet advancement/toleration  NUTRITION DIAGNOSIS:   Severe Malnutrition related to chronic illness(PUD with surgery) as evidenced by energy intake < or equal to 75% for > or equal to 1 month, severe fat depletion, severe muscle depletion, percent weight loss.  Ongoing  GOAL:   Patient will meet greater than or equal to 90% of their needs  Met with TPN  MONITOR:   PO intake, Supplement acceptance, Weight trends, Labs, Diet advancement, I & O's  REASON FOR ASSESSMENT:   Consult New TPN/TNA  ASSESSMENT:   Patient with PMH significant for GERD, and perforated peptic ulcer disease (possible surgery?). Presents this admission with free air ascites with thicken bowel loops from unclear etiology.    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 Plan for EGD 2/28. KUB pending from this am.   Pt remains NPO with NGT to suction. Eating occasional ice chips. Tolerating TPN @ 80 ml/hr (provides 2070 kcal and 105 g protein). Meets 100% of needs.   Weight noted to increase from 121 ln on 2/14 to 126 lb on 2/21. Recommend checking daily weights.   NGT: 550 ml x 24 hrs Drains: 90 x 24 hrs UOP: 2675 ml x 24 hrs   Medications reviewed: NS @ 50 ml/hr Labs reviewed: CBG 105-110 (wdl), TGs 89 (wdl)   Diet Order:   Diet Order            Diet NPO time specified Except for: Ice Chips  Diet effective now              EDUCATION NEEDS:   Not appropriate for education at this time  Skin:  Skin Assessment: Reviewed RN Assessment  Last BM:  2/23  Height:   Ht Readings from Last 1 Encounters:  10/25/18 _0  (1.88 m)    Weight:   Wt Readings from Last 1 Encounters:  11/02/18 57.1 kg    Ideal Body  Weight:  86.4 kg  BMI:  Body mass index is 16.16 kg/m.  Estimated Nutritional Needs:   Kcal:  1900-2100 grams  Protein:  95-115 grams  Fluid:  >/= 1.9 L/day   Mariana Single RD, LDN Clinical Nutrition Pager # - [phone]

## 2018-11-08 NOTE — Progress Notes (Signed)
Referring Physician(s): Rayburn, Alphonsus Sias  Supervising Physician: Malachy Moan  Patient Status:  [facility] - In-pt  Chief Complaint: None  Subjective:  Multiple intraabdominal abscesses s/p bilateral abdominal/flank abscess drain placements 11/02/2018 by Dr. Miles Costain. Patient awake and alert sitting in bed watching tv with no complaints at this time. Bilateral abscess drains c/d/i.  CT abdomen/pelvis 11/08/2018: 1. Decrease in free intraperitoneal air in the left upper abdomen and left subphrenic space. 2. Affective drainage of right lateral abdominal fluid collection by catheter placement likely in the retroperitoneal space. 3. The left lateral abdominal fluid collection is still quite large after catheter placement and it is recommended that the drain be flushed appropriately. If this does not result in further fluid return, this catheter may need to be exchanged and upsized as well as slightly repositioned in the cavity. 4. Two additional undrained marginated fluid collections in the right lower quadrant and deep central pelvis. The right lower quadrant collection that extends into the right pelvis is just deep to the abdominal wall and would be amenable to percutaneous catheter drainage, if indicated. The deep pelvic collection is not currently approachable percutaneously.   Allergies: Patient has no known allergies.  Medications: Prior to Admission medications   Medication Sig Start Date End Date Taking? Authorizing Provider  Alum & Mag Hydroxide-Simeth (GI COCKTAIL) SUSP suspension Take 30 mLs by mouth 2 (two) times daily. Shake well. 10/10/18  Yes Gwyneth Sprout, MD  omeprazole (PRILOSEC) 20 MG capsule Take 1 capsule (20 mg total) by mouth daily. 10/10/18  Yes Gwyneth Sprout, MD     Vital Signs: BP 140/70 (BP Location: Left Arm)   Pulse 89   Temp 97.9 F (36.6 C) (Oral)   Resp 18   Ht  (1.88 m)   Wt 125 lb 14.1 oz (57.1 kg)   SpO2 100%   BMI 16.16 kg/m    Physical Exam Vitals signs and nursing note reviewed.  Constitutional:      General: He is not in acute distress.    Appearance: Normal appearance.  Cardiovascular:     Rate and Rhythm: Normal rate and regular rhythm.     Heart sounds: Normal heart sounds. No murmur.  Pulmonary:     Effort: Pulmonary effort is normal. No respiratory distress.     Breath sounds: Normal breath sounds. No wheezing.  Abdominal:     Comments: Bilateral intraabdominal abscess drain sites without tenderness, erythema, or drainage; approximately 50 mL milky yellow fluid in left gravity bag, minimal output of milky yellow fluid in right gravity bag; bilateral intraabdominal abscess drains flush/aspirate without resistance.  Skin:    General: Skin is warm and dry.  Neurological:     Mental Status: He is alert and oriented to person, place, and time.  Psychiatric:        Mood and Affect: Mood normal.        Behavior: Behavior normal.        Thought Content: Thought content normal.        Judgment: Judgment normal.     Imaging: Dg Abd 1 View  Result Date: 11/08/2018 CLINICAL DATA:  Intra-abdominal free air EXAM: ABDOMEN - 1 VIEW COMPARISON:  November 03, 2018 FINDINGS: The bowel gas pattern is normal. Nasogastric tube is identified distal tip probably in the proximal stomach. Free air in the left upper abdomen is unchanged. Changed to in bilateral abdomen are unchanged. IMPRESSION: Persistent free air is not significantly changed. No bowel obstruction. Electronically Signed  By: Sherian Rein M.D.   On: 11/08/2018 07:34   Ct Abdomen Pelvis W Contrast  Result Date: 11/08/2018 CLINICAL DATA:  Status post percutaneous catheter drainage large bilateral flank retroperitoneal fluid collections on 11/02/2018. EXAM: CT ABDOMEN AND PELVIS WITH CONTRAST TECHNIQUE: Multidetector CT imaging of the abdomen and pelvis was performed using the standard protocol following bolus administration of intravenous contrast.  CONTRAST:  OMNIPAQUE IOHEXOL 300 MG/ML  SOLN COMPARISON:  CT of the abdomen and pelvis on 10/25/2018 and MRI of the abdomen on 11/02/2018 FINDINGS: Lower chest: Mild atelectasis at the right lung base. Hepatobiliary: No focal liver abnormality is seen. No gallstones, gallbladder wall thickening, or biliary dilatation. Pancreas: Unremarkable. No pancreatic ductal dilatation or surrounding inflammatory changes. Spleen: Normal in size without focal abnormality. Adrenals/Urinary Tract: Adrenal glands are unremarkable. Kidneys are normal, without renal calculi, focal lesion, or hydronephrosis. Bladder is unremarkable. Stomach/Bowel: The stomach is decompressed by a nasogastric catheter. There remains wall thickening involving the proximal stomach and body of the stomach. There remains some free intraperitoneal air in the left upper abdomen and left subphrenic space with overall decrease in free air since the prior CT on 10/25/2018. No small bowel obstruction or significant colonic ileus. Vascular/Lymphatic: No significant vascular findings are present. No enlarged abdominal or pelvic lymph nodes. Reproductive: Prostate is unremarkable. Other: Drainage catheter on the right extends up to the posterior margin of the lower liver. There is a small amount residual fluid inferior to the liver. Drainage catheter on the left extends into a residual large left lateral abdominal abscess measuring up to 5.8 x 10.3 cm transversely. This collection also extends into the left pelvis. Additional right lower quadrant anterior peritoneal abscess just deep to the abdominal wall measures approximately 8 cm in transverse with extension into the right pelvis. Additional deep pelvic collection anterior to the rectum measures approximately 4.2 x 5.8 cm. There are some other small areas of fluid interspersed between small bowel loops in the central abdomen and throughout the pelvis. Musculoskeletal: No acute or significant osseous  findings. IMPRESSION: 1. Decrease in free intraperitoneal air in the left upper abdomen and left subphrenic space. 2. Affective drainage of right lateral abdominal fluid collection by catheter placement likely in the retroperitoneal space. 3. The left lateral abdominal fluid collection is still quite large after catheter placement and it is recommended that the drain be flushed appropriately. If this does not result in further fluid return, this catheter may need to be exchanged and upsized as well as slightly repositioned in the cavity. 4. Two additional undrained marginated fluid collections in the right lower quadrant and deep central pelvis. The right lower quadrant collection that extends into the right pelvis is just deep to the abdominal wall and would be amenable to percutaneous catheter drainage, if indicated. The deep pelvic collection is not currently approachable percutaneously. Electronically Signed   By: Irish Lack M.D.   On: 11/08/2018 10:50    Labs:  CBC: Recent Labs    11/03/18 0408 11/04/18 0806 11/05/18 0429 11/07/18 0520  WBC 3.4* 4.5 5.1 6.8  HGB 9.5* 9.4* 9.1* 9.6*  HCT 29.8* 28.9* 28.2* 29.2*  PLT 308 303 280 308    COAGS: Recent Labs    10/26/18 0500  INR 1.13    BMP: Recent Labs    11/04/18 0447 11/05/18 0429 11/07/18 0520 11/08/18 0349  NA 137 137 135 136  K 4.0 3.8 3.8 4.1  CL 104 104 102 102  CO2 28 29 30  29  GLUCOSE 117* 114* 114* 108*  BUN 19 17 18 19   CALCIUM 8.0* 8.0* 8.3* 8.5*  CREATININE 0.67 0.73 0.72 0.73  GFRNONAA >60 >60 >60 >60  GFRAA >60 >60 >60 >60    LIVER FUNCTION TESTS: Recent Labs    10/29/18 0406 11/01/18 0400 11/05/18 0429 11/08/18 0349  BILITOT 0.1* 0.8 0.3 0.3  AST 34 29 29 28   ALT 27 24 23 25   ALKPHOS 98 124 152* 178*  PROT 5.9* 6.5 6.4* 7.3  ALBUMIN 1.7* 1.7* 1.7* 2.0*    Assessment and Plan:  Multiple intraabdominal abscesses s/p bilateral abdominal/flank abscess drain placements 11/02/2018 by Dr.  Miles Costain. Bilateral drains stable with low output. CT today reviewed by Dr. Fredia Sorrow and Dr. Archer Asa, revealed bilateral drains in good position within abscesses- recommend drain exchanges/upsizing; also revealed two possible additional abscesses not within drain margins- recommend drain placement. Discussed procedures with patient, including risks and benefits. Patient asked that I call his son to speak with him also. Spoke with son in front of patient who also agreed to procedure. Plan for image-guided intraabdominal abscess drain exchange/upsize x2 along with image-guided intraabdominal drain placement x2 tentatively for tomorrow 11/09/2018 with Dr. Archer Asa. Patient will be NPO at midnight. Afebrile. Heparin held per IR protocol. INR pending.   Electronically Signed: Elwin Mocha, PA-C 11/08/2018, 3:55 PM   I spent a total of 35 Minutes at the the patient's bedside AND on the patient's hospital floor or unit, greater than 50% of which was counseling/coordinating care for multiple intraabdominal abscesses s/p drain placement x2.

## 2018-11-08 NOTE — Progress Notes (Signed)
[facility] Gastroenterology Progress Note  Vincent Park 63 y.o. 16-Mar-1956  CC: Abnormal imaging   Subjective: Continues to feel better.  X-ray and CT scan report reviewed and discussed with the patient.   Objective: Vital signs in last 24 hours: Vitals:   11/08/18 0634 11/08/18 1324  BP: 130/75 140/70  Pulse: 97 89  Resp: 18 18  Temp: 98.6 F (37 C) 97.9 F (36.6 C)  SpO2: 100% 100%    Physical Exam:  General.  Thin patient.  Not in acute distress. Abdomen.  Soft, nontender, nondistended, bowel sounds present.  bilateral  drain with purulent output.  Lab Results: Recent Labs    11/07/18 0520 11/08/18 0349  NA 135 136  K 3.8 4.1  CL 102 102  CO2 30 29  GLUCOSE 114* 108*  BUN 18 19  CREATININE 0.72 0.73  CALCIUM 8.3* 8.5*  MG  --  1.9  PHOS  --  4.3   Recent Labs    11/08/18 0349  AST 28  ALT 25  ALKPHOS 178*  BILITOT 0.3  PROT 7.3  ALBUMIN 2.0*   Recent Labs    11/07/18 0520  WBC 6.8  HGB 9.6*  HCT 29.2*  MCV 103.5*  PLT 308   No results for input(s): LABPROT, INR in the last 72 hours.    Assessment/Plan: -Abnormal imaging concerning for gastric ulcer ( ??  Gastric malignancy)  with microperforation, intraperitoneal free air and ascites.  MRI showed masslike thickening of the proximal wall of the stomach. -Abdominal flank abscess status post IR guided drain placement. -Elevated CA 19-9  -Small bowel obstruction.  Recommendations ----------------------- -X-ray this morning showed persistent free air.  CT scan today showed persistent left lateral abdominal fluid collection as well as 2 additional fluid collection in the right lower quadrant and in deep central pelvis.  -I will hold off on EGD for now.  -Further management per surgery and intervention radiology.  -GI will follow periodically.   Kathi Der MD, FACP 11/08/2018, 2:16 PM  Contact #  [phone]

## 2018-11-08 NOTE — Progress Notes (Signed)
PHARMACY - ADULT TOTAL PARENTERAL NUTRITION CONSULT NOTE   Pharmacy Consult for TPN Indication: bowel rest, severe malnutrition PTA  Patient Measurements: Height: 6' 2"  (188 cm) Weight: 125 lb 14.1 oz (57.1 kg) IBW/kg (Calculated) : 82.2 TPN AdjBW (KG): 54 Body mass index is 16.16 kg/m. Usual Weight: unknown as patient is poor historian, but reports significant weight loss over the past year  HPI: 61 yoM with PMH PUD with perforated gastric ulcer s/p repair in 2003, GERD presents with worsening chronic abdominal pain and associated weight loss. Cachectic appearing. CT reveals ascites with concern for SBO vs recurrent ulcer disease. Pt is NPO. Consulted by surgery for TPN management. Pt will require nutritional support prior to any surgical intervention.  Central access: Double lumen PICC placed 2/14 TPN start date: 2/14  Significant events:  2/23: per [facility], continue NGT, NPO and TPN today 2/27 KUB this am results pending in Epic,  Tentative plan for EGD 2/28  ASSESSMENT                                                                                               Insulin requirements: none  Current Nutrition: NPO except for ice chips  IVF: NS at 50 mL/hr   Today, 11/08/2018:  Glucose (CBG goal 100-150) - SSI/CBGs dc'd 2/24  Electrolytes -WNL  Renal - Mild AKI on admission - resolved. SCr now WNL  LFTs, Alk Phos up to 178, other LFTs WNL. Albumin (2) low  TGs - WNL (120, 2/15), (117, 2/17)  89 2/24  Prealbumin - <5  2/14, 5.7 2/17, 10.2 2/24 - coming up on TPN  NUTRITIONAL GOALS                                                                                 RD recs (per note on 2/14): Kcal: 1900 - 2100 grams Protein: 95 - 115 g Fluid: >/= 1.9 L/day  Custom TPN at goal rate of 80 mL/hr will provide: 2070 kcal, 105 g protein, and 326 g dextrose, meeting 100% of patient needs    PLAN                                                                                                                At 1800 today:  Continue TPN at goal rate of 80 mL/hr providing 100 %  of patient needs with 2070 kcal and 105 g protein  Continue with standard electrolytes with increase Mg slightly to 7.5 meq/L. Cl:Ac 1:2.    IVF NS at 50 ml/hr continues  TPN lab panels on Mondays & Thursdays  Eudelia Bunch, Pharm.D [phone] 11/08/2018 7:24 AM

## 2018-11-09 ENCOUNTER — Inpatient Hospital Stay (HOSPITAL_COMMUNITY): Payer: Self-pay

## 2018-11-09 ENCOUNTER — Encounter (HOSPITAL_COMMUNITY): Payer: Self-pay | Admitting: Interventional Radiology

## 2018-11-09 HISTORY — PX: IR CATHETER TUBE CHANGE: IMG717

## 2018-11-09 HISTORY — PX: IR US GUIDE BX ASP/DRAIN: IMG2392

## 2018-11-09 MED ORDER — FENTANYL CITRATE (PF) 100 MCG/2ML IJ SOLN
INTRAMUSCULAR | Status: AC
  Filled 2018-11-09: qty 2

## 2018-11-09 MED ORDER — TRAVASOL 10 % IV SOLN
INTRAVENOUS | Status: AC
  Administered 2018-11-09 – 2018-11-10 (×2): via INTRAVENOUS
  Filled 2018-11-09: qty 1056

## 2018-11-09 MED ORDER — CEFAZOLIN SODIUM-DEXTROSE 2-4 GM/100ML-% IV SOLN
INTRAVENOUS | Status: AC
  Filled 2018-11-09: qty 100

## 2018-11-09 MED ORDER — MIDAZOLAM HCL 2 MG/2ML IJ SOLN
INTRAMUSCULAR | Status: AC
  Filled 2018-11-09: qty 4

## 2018-11-09 MED ORDER — LIDOCAINE HCL 1 % IJ SOLN
INTRAMUSCULAR | Status: AC
  Filled 2018-11-09: qty 20

## 2018-11-09 MED ORDER — LIDOCAINE HCL (PF) 1 % IJ SOLN
INTRAMUSCULAR | Status: AC | PRN
  Administered 2018-11-09 (×2): 10 mL

## 2018-11-09 MED ORDER — SODIUM CHLORIDE 0.9% FLUSH
5.0000 mL | Freq: Three times a day (TID) | INTRAVENOUS | Status: DC
  Administered 2018-11-09 – 2018-11-22 (×35): 5 mL

## 2018-11-09 MED ORDER — IOPAMIDOL (ISOVUE-300) INJECTION 61%
50.0000 mL | Freq: Once | INTRAVENOUS | Status: AC | PRN
  Administered 2018-11-09: 30 mL

## 2018-11-09 MED ORDER — FENTANYL CITRATE (PF) 100 MCG/2ML IJ SOLN
INTRAMUSCULAR | Status: AC | PRN
  Administered 2018-11-09 (×2): 50 ug via INTRAVENOUS

## 2018-11-09 MED ORDER — HEPARIN SODIUM (PORCINE) 5000 UNIT/ML IJ SOLN
5000.0000 [IU] | Freq: Three times a day (TID) | INTRAMUSCULAR | Status: DC
  Administered 2018-11-09 – 2018-11-26 (×52): 5000 [IU] via SUBCUTANEOUS
  Filled 2018-11-09 (×51): qty 1

## 2018-11-09 MED ORDER — MIDAZOLAM HCL 2 MG/2ML IJ SOLN
INTRAMUSCULAR | Status: AC | PRN
  Administered 2018-11-09 (×3): 1 mg via INTRAVENOUS

## 2018-11-09 MED ORDER — IOPAMIDOL (ISOVUE-300) INJECTION 61%
INTRAVENOUS | Status: AC
  Administered 2018-11-09: 30 mL
  Filled 2018-11-09: qty 50

## 2018-11-09 NOTE — Progress Notes (Signed)
MEDICATION-RELATED CONSULT NOTE   IR Procedure Consult - Anticoagulant/Antiplatelet PTA/Inpatient Med List Review by Pharmacist    Procedure: see IR notes    Completed: 10:29 am  Post-Procedural bleeding risk per IR MD assessment:  standard  Antithrombotic medications on inpatient or PTA profile prior to procedure:   SQH    Recommended restart time per IR Post-Procedure Guidelines:  6 hours post procedure at standard administration times   Other considerations:   none   Plan:    Resume heparin 5000 sq q8 at 2200  Herby Abraham, Pharm.D [phone] 11/09/2018 10:58 AM

## 2018-11-09 NOTE — Progress Notes (Signed)
PHARMACY - ADULT TOTAL PARENTERAL NUTRITION CONSULT NOTE   Pharmacy Consult for TPN Indication: bowel rest, severe malnutrition PTA  Patient Measurements: Height: 6' 2"  (188 cm) Weight: 125 lb 14.1 oz (57.1 kg) IBW/kg (Calculated) : 82.2 TPN AdjBW (KG): 54 Body mass index is 16.16 kg/m. Usual Weight: unknown as patient is poor historian, but reports significant weight loss over the past year  HPI: 56 yoM with PMH PUD with perforated gastric ulcer s/p repair in 2003, GERD presents with worsening chronic abdominal pain and associated weight loss. Cachectic appearing. CT reveals ascites with concern for SBO vs recurrent ulcer disease. Pt is NPO. Consulted by surgery for TPN management. Pt will require nutritional support prior to any surgical intervention.  Central access: Double lumen PICC placed 2/14 TPN start date: 2/14  Significant events:  2/27 KUB w/ persistent free air, CT scan w/ persistent abd fluid collection plus 2 additional fluid collections RLQ anda deep central pelvis 2/28 to IR for drain exchanges/upsize x 2, place 2 more drains if possible  ASSESSMENT                                                                                               Insulin requirements: none  Current Nutrition: NPO except for ice chips  IVF: NS at 50 mL/hr   Today, 11/09/2018:  Glucose (CBG goal 100-150) - SSI/CBGs dc'd 2/24  Electrolytes -WNL, no labs today  Renal - Mild AKI on admission - resolved. SCr now WNL  LFTs, Alk Phos up to 178, other LFTs WNL. Albumin (2) low  TGs - WNL (120, 2/15), (117, 2/17)  89 2/24  Prealbumin - <5  2/14, 5.7 2/17, 10.2 2/24 - coming up on TPN  NUTRITIONAL GOALS                                                                                 RD recs (per note on 2/14): Kcal: 1900 - 2100 grams Protein: 95 - 115 g Fluid: >/= 1.9 L/day  Custom TPN at goal rate of 80 mL/hr will provide: 2070 kcal, 105 g protein, and 326 g dextrose, meeting 100%  of patient needs    PLAN  At 1800 today:  Continue TPN at goal rate of 80 mL/hr providing 100 % of patient needs with 2070 kcal and 105 g protein  Continue with standard electrolytes with increase Mg slightly to 7.5 meq/L. Cl:Ac 1:2.    IVF NS at 50 ml/hr continues  TPN lab panels on Mondays & Thursdays  Eudelia Bunch, Pharm.D [phone] 11/09/2018 8:16 AM

## 2018-11-09 NOTE — Progress Notes (Signed)
[facility] Progress Note     Subjective: CC-  Patient just got back from IR. Initial two drains were exchanged and 2 new drains placed. Patient without complaints. Abdominal pain well controlled. Denies n/v. Passing flatus. BM yesterday, somewhat loose.   Objective: Vital signs in last 24 hours: Temp:  [97.9 F (36.6 C)-98.2 F (36.8 C)] 98.2 F (36.8 C) (02/28 1003) Pulse Rate:  [84-100] 89 (02/28 1003) Resp:  [14-25] 14 (02/28 1003) BP: (108-140)/(66-82) 131/82 (02/28 1003) SpO2:  [97 %-100 %] 100 % (02/28 1003) Last BM Date: 11/07/18  Intake/Output from previous day: 02/27 0701 - 02/28 0700 In: 1956 [I.V.:1785.6; IV Piggyback:170.4] Out: 2680 [Urine:1975; Emesis/NG output:650; Drains:55] Intake/Output this shift: Total I/O In: -  Out: 570 [Urine:150; Emesis/NG output:50; Drains:370]  PE: Gen:  Alert, NAD, pleasant HEENT: EOM's intact, pupils equal and round Cardio: RRR Pulm:  CTAB, no W/R/R, effort normal Abd: Soft, ND, NT, +BS, 4 abdominal drains in place with purulent output Ext:  No BLE edema Skin: no rashes noted, warm and dry  Lab Results:  Recent Labs    11/07/18 0520  WBC 6.8  HGB 9.6*  HCT 29.2*  PLT 308   BMET Recent Labs    11/07/18 0520 11/08/18 0349  NA 135 136  K 3.8 4.1  CL 102 102  CO2 30 29  GLUCOSE 114* 108*  BUN 18 19  CREATININE 0.72 0.73  CALCIUM 8.3* 8.5*   PT/INR Recent Labs    11/08/18 1641  LABPROT 12.9  INR 1.0   CMP     Component Value Date/Time   NA 136 11/08/2018 0349   K 4.1 11/08/2018 0349   CL 102 11/08/2018 0349   CO2 29 11/08/2018 0349   GLUCOSE 108 (H) 11/08/2018 0349   BUN 19 11/08/2018 0349   CREATININE 0.73 11/08/2018 0349   CALCIUM 8.5 (L) 11/08/2018 0349   PROT 7.3 11/08/2018 0349   ALBUMIN 2.0 (L) 11/08/2018 0349   AST 28 11/08/2018 0349   ALT 25 11/08/2018 0349   ALKPHOS 178 (H) 11/08/2018 0349   BILITOT 0.3 11/08/2018 0349   GFRNONAA >60 11/08/2018 0349   GFRAA >60  11/08/2018 0349   Lipase     Component Value Date/Time   LIPASE 30 10/25/2018 1721       Studies/Results: Dg Abd 1 View  Result Date: 11/08/2018 CLINICAL DATA:  Intra-abdominal free air EXAM: ABDOMEN - 1 VIEW COMPARISON:  November 03, 2018 FINDINGS: The bowel gas pattern is normal. Nasogastric tube is identified distal tip probably in the proximal stomach. Free air in the left upper abdomen is unchanged. Changed to in bilateral abdomen are unchanged. IMPRESSION: Persistent free air is not significantly changed. No bowel obstruction. Electronically Signed   By: Sherian Rein M.D.   On: 11/08/2018 07:34   Ct Abdomen Pelvis W Contrast  Result Date: 11/08/2018 CLINICAL DATA:  Status post percutaneous catheter drainage large bilateral flank retroperitoneal fluid collections on 11/02/2018. EXAM: CT ABDOMEN AND PELVIS WITH CONTRAST TECHNIQUE: Multidetector CT imaging of the abdomen and pelvis was performed using the standard protocol following bolus administration of intravenous contrast. CONTRAST:  OMNIPAQUE IOHEXOL 300 MG/ML  SOLN COMPARISON:  CT of the abdomen and pelvis on 10/25/2018 and MRI of the abdomen on 11/02/2018 FINDINGS: Lower chest: Mild atelectasis at the right lung base. Hepatobiliary: No focal liver abnormality is seen. No gallstones, gallbladder wall thickening, or biliary dilatation. Pancreas: Unremarkable. No pancreatic ductal dilatation or surrounding inflammatory changes. Spleen: Normal in  size without focal abnormality. Adrenals/Urinary Tract: Adrenal glands are unremarkable. Kidneys are normal, without renal calculi, focal lesion, or hydronephrosis. Bladder is unremarkable. Stomach/Bowel: The stomach is decompressed by a nasogastric catheter. There remains wall thickening involving the proximal stomach and body of the stomach. There remains some free intraperitoneal air in the left upper abdomen and left subphrenic space with overall decrease in free air since the prior CT on  10/25/2018. No small bowel obstruction or significant colonic ileus. Vascular/Lymphatic: No significant vascular findings are present. No enlarged abdominal or pelvic lymph nodes. Reproductive: Prostate is unremarkable. Other: Drainage catheter on the right extends up to the posterior margin of the lower liver. There is a small amount residual fluid inferior to the liver. Drainage catheter on the left extends into a residual large left lateral abdominal abscess measuring up to 5.8 x 10.3 cm transversely. This collection also extends into the left pelvis. Additional right lower quadrant anterior peritoneal abscess just deep to the abdominal wall measures approximately 8 cm in transverse with extension into the right pelvis. Additional deep pelvic collection anterior to the rectum measures approximately 4.2 x 5.8 cm. There are some other small areas of fluid interspersed between small bowel loops in the central abdomen and throughout the pelvis. Musculoskeletal: No acute or significant osseous findings. IMPRESSION: 1. Decrease in free intraperitoneal air in the left upper abdomen and left subphrenic space. 2. Affective drainage of right lateral abdominal fluid collection by catheter placement likely in the retroperitoneal space. 3. The left lateral abdominal fluid collection is still quite large after catheter placement and it is recommended that the drain be flushed appropriately. If this does not result in further fluid return, this catheter may need to be exchanged and upsized as well as slightly repositioned in the cavity. 4. Two additional undrained marginated fluid collections in the right lower quadrant and deep central pelvis. The right lower quadrant collection that extends into the right pelvis is just deep to the abdominal wall and would be amenable to percutaneous catheter drainage, if indicated. The deep pelvic collection is not currently approachable percutaneously. Electronically Signed   By: Irish Lack M.D.   On: 11/08/2018 10:50   Ir Catheter Tube Change  Result Date: 11/09/2018 INDICATION: 63 year old male with florid infectious peritonitis and extensive intraperitoneal abscesses. He was previously treated by placement of bilateral percutaneous drainage catheters on 11/02/2018. Recurrent CT imaging demonstrates ineffective drainage. He presents today for drain exchange, up size and placement of additional drainage catheters. EXAM: 1. Drain injection, exchange and up size of the existing right upper quadrant drainage catheter. 2. Placement of a new right lower quadrant drainage catheter using ultrasound and fluoroscopic guidance. 3. Drain injection, exchange and up size of the existing left upper quadrant drainage catheter. 4. Placement of a new left lower quadrant drainage catheter using ultrasound and fluoroscopic guidance. MEDICATIONS: 2 g Ancef ANESTHESIA/SEDATION: Fentanyl 100 mcg IV; Versed 3 mg IV Moderate Sedation Time:  40 minutes The patient was continuously monitored during the procedure by the interventional radiology nurse under my direct supervision. FLUOROSCOPY TIME:  3 minutes 24 seconds, 10 mGy COMPLICATIONS: None immediate. PROCEDURE: Informed written consent was obtained from the patient after a thorough discussion of the procedural risks, benefits and alternatives. All questions were addressed. Maximal Sterile Barrier Technique was utilized including caps, mask, sterile gowns, sterile gloves, sterile drape, hand hygiene and skin antiseptic. A timeout was performed prior to the initiation of the procedure. Attention was first turned to the existing right  upper quadrant drainage catheter which enters the peritoneal cavity from the right mid abdomen. A gentle hand injection of contrast material was performed. There is a persistent abscess cavity around the tip of the catheter. The injected contrast material extends inferiorly along the right abdominal wall nearly to the skin entry  site. The decision was made to proceed with drain exchange and up size. The drainage type also be transition to a biliary drain to facilitate drainage of the entire length of the collection. The retention suture was cut and released. The catheter was transected and removed over an Amplatz wire. The skin tract was dilated to 59 [place] and a 63 [place] biliary drainage catheter was advanced over the wire and formed. Injection through the biliary drain demonstrates its location within the right abdominal fluid collection. The drainage catheter was secured to the skin with 0 Prolene suture and connected to JP bulb suction. Attention was next turned to the right lower quadrant where there was a known fluid collection on the prior CT scan. Ultrasound reveals a complex fluid pocket consistent with peritoneal fluid. A skin entry site was marked. Local anesthesia was attained by infiltration with 1% lidocaine. A small dermatotomy was made. Under real-time sonographic guidance, an 18 gauge single wall needle was advanced into the fluid collection. A 0.018 wire was then advanced through the needle and the needle was removed. The skin tract was dilated to 55 [place] and a Cook 14 [place] percutaneous drain was advanced into the fluid collection and formed. A hand injection of contrast material reveals that the fluid collection extends inferiorly into the right inguinal recess. Therefore, the catheter was manipulated into the more dependent aspect in the right inguinal recess. The catheter was secured to the skin with 0 Prolene suture and connected to JP bulb suction. Attention was next turned to the existing left upper quadrant drain. Again, a hand injection of contrast material was performed. This demonstrates a large persistent abscess cavity. The internal fluid appears quite thick. The decision was made to proceed with up sizing of this drainage catheter to a larger drain to facilitate drainage. The retention suture was cut.  The catheter was transected and removed over an Amplatz wire. The skin tract was dilated to 72 [place] and a 16 [place] Thal drainage catheter was advanced over the wire and formed in the left upper quadrant. The catheter was connected to JP bulb suction and secured to the skin with 0 Prolene suture. This fluid collection is known to extend significantly along the left abdominal wall and into the pelvis. Therefore, ultrasound was again used to evaluate the fluid collection. There is a large fluid collection in the left abdomen. A suitable skin entry site was selected and marked. Local anesthesia was attained by infiltration with 1% lidocaine. A small dermatotomy was made. Under real-time sonographic guidance, an 18 gauge single wall needle was used to puncture the fluid collection. An Amplatz wire was advanced in an inferior direction toward the left inguinal recess. The 18 gauge needle was removed. The skin tract was dilated to 78 [place] and a 64 [place] biliary drain was advanced over the wire and positioned in the anatomic pelvis. Contrast injection confirms that the catheter is located within the peritoneal fluid. The catheter was flushed and connected to JP bulb suction and secured to the skin with 0 Prolene suture. IMPRESSION: 1. Exchange and up size of existing right upper quadrant drainage catheter to a 14 [place] biliary drain. 2. Placement of a new  right lower quadrant 14 French all-purpose drainage catheter. 3. Exchange and up size of existing left upper quadrant drainage catheter to a 16 [place] Thal drain. 4. Placement of a new left lower quadrant 14 French biliary drainage catheter. PLAN: 1. Maintain all tubes to JP bulb suction. 2. All tubes should be flushed at least once per shift. 3. Once drain output has diminished or ceased, additional CT imaging of the abdomen and pelvis with intravenous contrast should be obtained prior to drain removal. Signed, Sterling Big, MD, RPVI [facility] Radiology Electronically Signed   By: Malachy Moan M.D.   On: 11/09/2018 10:43   Ir Catheter Tube Change  Result Date: 11/09/2018 INDICATION: 63 year old male with florid infectious peritonitis and extensive intraperitoneal abscesses. He was previously treated by placement of bilateral percutaneous drainage catheters on 11/02/2018. Recurrent CT imaging demonstrates ineffective drainage. He presents today for drain exchange, up size and placement of additional drainage catheters. EXAM: 1. Drain injection, exchange and up size of the existing right upper quadrant drainage catheter. 2. Placement of a new right lower quadrant drainage catheter using ultrasound and fluoroscopic guidance. 3. Drain injection, exchange and up size of the existing left upper quadrant drainage catheter. 4. Placement of a new left lower quadrant drainage catheter using ultrasound and fluoroscopic guidance. MEDICATIONS: 2 g Ancef ANESTHESIA/SEDATION: Fentanyl 100 mcg IV; Versed 3 mg IV Moderate Sedation Time:  40 minutes The patient was continuously monitored during the procedure by the interventional radiology nurse under my direct supervision. FLUOROSCOPY TIME:  3 minutes 24 seconds, 10 mGy COMPLICATIONS: None immediate. PROCEDURE: Informed written consent was obtained from the patient after a thorough discussion of the procedural risks, benefits and alternatives. All questions were addressed. Maximal Sterile Barrier Technique was utilized including caps, mask, sterile gowns, sterile gloves, sterile drape, hand hygiene and skin antiseptic. A timeout was performed prior to the initiation of the procedure. Attention was first turned to the existing right upper quadrant drainage catheter which enters the peritoneal cavity from the right mid abdomen. A gentle hand injection of contrast material was performed. There is a persistent abscess cavity around the tip of the catheter. The injected  contrast material extends inferiorly along the right abdominal wall nearly to the skin entry site. The decision was made to proceed with drain exchange and up size. The drainage type also be transition to a biliary drain to facilitate drainage of the entire length of the collection. The retention suture was cut and released. The catheter was transected and removed over an Amplatz wire. The skin tract was dilated to 46 [place] and a 7 [place] biliary drainage catheter was advanced over the wire and formed. Injection through the biliary drain demonstrates its location within the right abdominal fluid collection. The drainage catheter was secured to the skin with 0 Prolene suture and connected to JP bulb suction. Attention was next turned to the right lower quadrant where there was a known fluid collection on the prior CT scan. Ultrasound reveals a complex fluid pocket consistent with peritoneal fluid. A skin entry site was marked. Local anesthesia was attained by infiltration with 1% lidocaine. A small dermatotomy was made. Under real-time sonographic guidance, an 18 gauge single wall needle was advanced into the fluid collection. A 0.018 wire was then advanced through the needle and the needle was removed. The skin tract was dilated to 82 [place] and a Cook 14 [place] percutaneous drain was advanced into the fluid collection and formed. A  hand injection of contrast material reveals that the fluid collection extends inferiorly into the right inguinal recess. Therefore, the catheter was manipulated into the more dependent aspect in the right inguinal recess. The catheter was secured to the skin with 0 Prolene suture and connected to JP bulb suction. Attention was next turned to the existing left upper quadrant drain. Again, a hand injection of contrast material was performed. This demonstrates a large persistent abscess cavity. The internal fluid appears quite thick. The decision was made to proceed with up sizing of this  drainage catheter to a larger drain to facilitate drainage. The retention suture was cut. The catheter was transected and removed over an Amplatz wire. The skin tract was dilated to 41 [place] and a 16 [place] Thal drainage catheter was advanced over the wire and formed in the left upper quadrant. The catheter was connected to JP bulb suction and secured to the skin with 0 Prolene suture. This fluid collection is known to extend significantly along the left abdominal wall and into the pelvis. Therefore, ultrasound was again used to evaluate the fluid collection. There is a large fluid collection in the left abdomen. A suitable skin entry site was selected and marked. Local anesthesia was attained by infiltration with 1% lidocaine. A small dermatotomy was made. Under real-time sonographic guidance, an 18 gauge single wall needle was used to puncture the fluid collection. An Amplatz wire was advanced in an inferior direction toward the left inguinal recess. The 18 gauge needle was removed. The skin tract was dilated to 59 [place] and a 103 [place] biliary drain was advanced over the wire and positioned in the anatomic pelvis. Contrast injection confirms that the catheter is located within the peritoneal fluid. The catheter was flushed and connected to JP bulb suction and secured to the skin with 0 Prolene suture. IMPRESSION: 1. Exchange and up size of existing right upper quadrant drainage catheter to a 14 [place] biliary drain. 2. Placement of a new right lower quadrant 14 French all-purpose drainage catheter. 3. Exchange and up size of existing left upper quadrant drainage catheter to a 16 [place] Thal drain. 4. Placement of a new left lower quadrant 14 French biliary drainage catheter. PLAN: 1. Maintain all tubes to JP bulb suction. 2. All tubes should be flushed at least once per shift. 3. Once drain output has diminished or ceased, additional CT imaging of the abdomen and pelvis with intravenous contrast should be  obtained prior to drain removal. Signed, Sterling Big, MD, RPVI [facility] Radiology Electronically Signed   By: Malachy Moan M.D.   On: 11/09/2018 10:43   Ir US Guide Bx Asp/drain  Result Date: 11/09/2018 INDICATION: 63 year old male with florid infectious peritonitis and extensive intraperitoneal abscesses. He was previously treated by placement of bilateral percutaneous drainage catheters on 11/02/2018. Recurrent CT imaging demonstrates ineffective drainage. He presents today for drain exchange, up size and placement of additional drainage catheters. EXAM: 1. Drain injection, exchange and up size of the existing right upper quadrant drainage catheter. 2. Placement of a new right lower quadrant drainage catheter using ultrasound and fluoroscopic guidance. 3. Drain injection, exchange and up size of the existing left upper quadrant drainage catheter. 4. Placement of a new left lower quadrant drainage catheter using ultrasound and fluoroscopic guidance. MEDICATIONS: 2 g Ancef ANESTHESIA/SEDATION: Fentanyl 100 mcg IV; Versed 3 mg IV Moderate Sedation Time:  40 minutes The patient was continuously monitored during the procedure by the interventional radiology nurse  under my direct supervision. FLUOROSCOPY TIME:  3 minutes 24 seconds, 10 mGy COMPLICATIONS: None immediate. PROCEDURE: Informed written consent was obtained from the patient after a thorough discussion of the procedural risks, benefits and alternatives. All questions were addressed. Maximal Sterile Barrier Technique was utilized including caps, mask, sterile gowns, sterile gloves, sterile drape, hand hygiene and skin antiseptic. A timeout was performed prior to the initiation of the procedure. Attention was first turned to the existing right upper quadrant drainage catheter which enters the peritoneal cavity from the right mid abdomen. A gentle hand injection of contrast material was  performed. There is a persistent abscess cavity around the tip of the catheter. The injected contrast material extends inferiorly along the right abdominal wall nearly to the skin entry site. The decision was made to proceed with drain exchange and up size. The drainage type also be transition to a biliary drain to facilitate drainage of the entire length of the collection. The retention suture was cut and released. The catheter was transected and removed over an Amplatz wire. The skin tract was dilated to 4214 [place] and a 5314 [place] biliary drainage catheter was advanced over the wire and formed. Injection through the biliary drain demonstrates its location within the right abdominal fluid collection. The drainage catheter was secured to the skin with 0 Prolene suture and connected to JP bulb suction. Attention was next turned to the right lower quadrant where there was a known fluid collection on the prior CT scan. Ultrasound reveals a complex fluid pocket consistent with peritoneal fluid. A skin entry site was marked. Local anesthesia was attained by infiltration with 1% lidocaine. A small dermatotomy was made. Under real-time sonographic guidance, an 18 gauge single wall needle was advanced into the fluid collection. A 0.018 wire was then advanced through the needle and the needle was removed. The skin tract was dilated to 3314 [place] and a Cook 14 [place] percutaneous drain was advanced into the fluid collection and formed. A hand injection of contrast material reveals that the fluid collection extends inferiorly into the right inguinal recess. Therefore, the catheter was manipulated into the more dependent aspect in the right inguinal recess. The catheter was secured to the skin with 0 Prolene suture and connected to JP bulb suction. Attention was next turned to the existing left upper quadrant drain. Again, a hand injection of contrast material was performed. This demonstrates a large persistent abscess cavity.  The internal fluid appears quite thick. The decision was made to proceed with up sizing of this drainage catheter to a larger drain to facilitate drainage. The retention suture was cut. The catheter was transected and removed over an Amplatz wire. The skin tract was dilated to 2316 [place] and a 16 [place] Thal drainage catheter was advanced over the wire and formed in the left upper quadrant. The catheter was connected to JP bulb suction and secured to the skin with 0 Prolene suture. This fluid collection is known to extend significantly along the left abdominal wall and into the pelvis. Therefore, ultrasound was again used to evaluate the fluid collection. There is a large fluid collection in the left abdomen. A suitable skin entry site was selected and marked. Local anesthesia was attained by infiltration with 1% lidocaine. A small dermatotomy was made. Under real-time sonographic guidance, an 18 gauge single wall needle was used to puncture the fluid collection. An Amplatz wire was advanced in an inferior direction toward the left inguinal recess. The 18 gauge needle was removed. The  skin tract was dilated to 26 [place] and a 27 [place] biliary drain was advanced over the wire and positioned in the anatomic pelvis. Contrast injection confirms that the catheter is located within the peritoneal fluid. The catheter was flushed and connected to JP bulb suction and secured to the skin with 0 Prolene suture. IMPRESSION: 1. Exchange and up size of existing right upper quadrant drainage catheter to a 14 [place] biliary drain. 2. Placement of a new right lower quadrant 14 French all-purpose drainage catheter. 3. Exchange and up size of existing left upper quadrant drainage catheter to a 16 [place] Thal drain. 4. Placement of a new left lower quadrant 14 French biliary drainage catheter. PLAN: 1. Maintain all tubes to JP bulb suction. 2. All tubes should be flushed at least once per shift. 3. Once drain output has diminished or  ceased, additional CT imaging of the abdomen and pelvis with intravenous contrast should be obtained prior to drain removal. Signed, Sterling Big, MD, RPVI [facility] Radiology Electronically Signed   By: Malachy Moan M.D.   On: 11/09/2018 10:43   Ir US Guide Bx Asp/drain  Result Date: 11/09/2018 INDICATION: 63 year old male with florid infectious peritonitis and extensive intraperitoneal abscesses. He was previously treated by placement of bilateral percutaneous drainage catheters on 11/02/2018. Recurrent CT imaging demonstrates ineffective drainage. He presents today for drain exchange, up size and placement of additional drainage catheters. EXAM: 1. Drain injection, exchange and up size of the existing right upper quadrant drainage catheter. 2. Placement of a new right lower quadrant drainage catheter using ultrasound and fluoroscopic guidance. 3. Drain injection, exchange and up size of the existing left upper quadrant drainage catheter. 4. Placement of a new left lower quadrant drainage catheter using ultrasound and fluoroscopic guidance. MEDICATIONS: 2 g Ancef ANESTHESIA/SEDATION: Fentanyl 100 mcg IV; Versed 3 mg IV Moderate Sedation Time:  40 minutes The patient was continuously monitored during the procedure by the interventional radiology nurse under my direct supervision. FLUOROSCOPY TIME:  3 minutes 24 seconds, 10 mGy COMPLICATIONS: None immediate. PROCEDURE: Informed written consent was obtained from the patient after a thorough discussion of the procedural risks, benefits and alternatives. All questions were addressed. Maximal Sterile Barrier Technique was utilized including caps, mask, sterile gowns, sterile gloves, sterile drape, hand hygiene and skin antiseptic. A timeout was performed prior to the initiation of the procedure. Attention was first turned to the existing right upper quadrant drainage catheter which enters the peritoneal  cavity from the right mid abdomen. A gentle hand injection of contrast material was performed. There is a persistent abscess cavity around the tip of the catheter. The injected contrast material extends inferiorly along the right abdominal wall nearly to the skin entry site. The decision was made to proceed with drain exchange and up size. The drainage type also be transition to a biliary drain to facilitate drainage of the entire length of the collection. The retention suture was cut and released. The catheter was transected and removed over an Amplatz wire. The skin tract was dilated to 74 [place] and a 55 [place] biliary drainage catheter was advanced over the wire and formed. Injection through the biliary drain demonstrates its location within the right abdominal fluid collection. The drainage catheter was secured to the skin with 0 Prolene suture and connected to JP bulb suction. Attention was next turned to the right lower quadrant where there was a known fluid collection on the prior CT scan. Ultrasound reveals a complex  fluid pocket consistent with peritoneal fluid. A skin entry site was marked. Local anesthesia was attained by infiltration with 1% lidocaine. A small dermatotomy was made. Under real-time sonographic guidance, an 18 gauge single wall needle was advanced into the fluid collection. A 0.018 wire was then advanced through the needle and the needle was removed. The skin tract was dilated to 18 [place] and a Cook 14 [place] percutaneous drain was advanced into the fluid collection and formed. A hand injection of contrast material reveals that the fluid collection extends inferiorly into the right inguinal recess. Therefore, the catheter was manipulated into the more dependent aspect in the right inguinal recess. The catheter was secured to the skin with 0 Prolene suture and connected to JP bulb suction. Attention was next turned to the existing left upper quadrant drain. Again, a hand injection of  contrast material was performed. This demonstrates a large persistent abscess cavity. The internal fluid appears quite thick. The decision was made to proceed with up sizing of this drainage catheter to a larger drain to facilitate drainage. The retention suture was cut. The catheter was transected and removed over an Amplatz wire. The skin tract was dilated to 55 [place] and a 16 [place] Thal drainage catheter was advanced over the wire and formed in the left upper quadrant. The catheter was connected to JP bulb suction and secured to the skin with 0 Prolene suture. This fluid collection is known to extend significantly along the left abdominal wall and into the pelvis. Therefore, ultrasound was again used to evaluate the fluid collection. There is a large fluid collection in the left abdomen. A suitable skin entry site was selected and marked. Local anesthesia was attained by infiltration with 1% lidocaine. A small dermatotomy was made. Under real-time sonographic guidance, an 18 gauge single wall needle was used to puncture the fluid collection. An Amplatz wire was advanced in an inferior direction toward the left inguinal recess. The 18 gauge needle was removed. The skin tract was dilated to 49 [place] and a 45 [place] biliary drain was advanced over the wire and positioned in the anatomic pelvis. Contrast injection confirms that the catheter is located within the peritoneal fluid. The catheter was flushed and connected to JP bulb suction and secured to the skin with 0 Prolene suture. IMPRESSION: 1. Exchange and up size of existing right upper quadrant drainage catheter to a 14 [place] biliary drain. 2. Placement of a new right lower quadrant 14 French all-purpose drainage catheter. 3. Exchange and up size of existing left upper quadrant drainage catheter to a 16 [place] Thal drain. 4. Placement of a new left lower quadrant 14 French biliary drainage catheter. PLAN: 1. Maintain all tubes to JP bulb suction. 2. All  tubes should be flushed at least once per shift. 3. Once drain output has diminished or ceased, additional CT imaging of the abdomen and pelvis with intravenous contrast should be obtained prior to drain removal. Signed, Sterling Big, MD, RPVI [facility] Electronically Signed   By: Malachy Moan M.D.   On: 11/09/2018 10:43    Anti-infectives: Anti-infectives (From admission, onward)   Start     Dose/Rate Route Frequency Ordered Stop   11/09/18 0600  ceFAZolin (ANCEF) IVPB 2g/100 mL premix     2 g 200 mL/hr over 30 Minutes Intravenous On call 11/08/18 1546 11/09/18 0900   10/26/18 0400  piperacillin-tazobactam (ZOSYN) IVPB 3.375 g     3.375 g 12.5 mL/hr over  240 Minutes Intravenous Every 8 hours 10/26/18 0245     10/25/18 1930  piperacillin-tazobactam (ZOSYN) IVPB 3.375 g     3.375 g 100 mL/hr over 30 Minutes Intravenous  Once 10/25/18 1928 10/25/18 2021       Assessment/Plan Hx of perforated gastric ulcer - s/p repair in 2003 Hx of medication non-compliance AKI- improving  Free air with ascites and thickened bowel loops -Benign exam and no signs of sepsis - this is likely subacute - this could be secondary to obstruction vs recurrent ulcer diseasevs perforated gastric cancer? - CA 19-9 elevated - patient is significantly malnourished with temporal wasting - needs TPN and nutritional support prior to any operative intervention - continue PPIBID, TPN and NPO - UGI study 2/19 showed no definitive leak, free air still seen - MR abdomen 2/21 shows gastric thickening - s/p IR drain placement x2. Culture: RARE ERYSIPELOTHRIX RHUSIOPATHIAE - CT scan 2/27 shows decreased free air, 2 persistent abscess with drains in place and 2 new fluid collections - s/p initial IR drain x2 upsized/replaced, 2 new IR drains placed - GI holding off on EGD for now due to persistent free air, hopefully next week  Severe protein  calorie malnutrition-  continue TPN and NG for now  FEN: NPO, TPN, IVF; protonixBID VTE: SCDs, sq heparin ID: zosyn 2/13>> Foley: none  Plan: Continue IR drains and abx. Continue NG tube. No further surgical needs until EGD complete.  Will see PRN over the weekend.   LOS: 15 days    Franne Forts , [facility] 11/09/2018, 10:47 AM Pager: [phone] Mon-Thurs 7:00 am-4:30 pm Fri 7:00 am -11:30 AM Sat-Sun 7:00 am-11:30 am

## 2018-11-09 NOTE — Procedures (Signed)
Interventional Radiology Procedure Note  Procedure:   1.) Exchange and upsize of RUQ drain to a 72F all-purpose drain.  2.) Placement of a new 72F biliary drain via the RLQ directed inferiorly toward the pelvis.  3.) Exchange and upsize of LUQ drain to a 50F Thal drain.  4.) Placement of a new 103 F biliary drain vis the LLQ directed toward the pelvis.   All drains to JP bulb suction.   Complications: None  Estimated Blood Loss: None  Recommendations: - Drains to bulb suction - Flush drains with 10 mL saline at least once per shift - Record output - Repeat CT abd/pelvis with IV contrast next week after drain out-put has slowed or ceased  Signed,  Sterling Big, MD

## 2018-11-09 NOTE — Progress Notes (Signed)
PROGRESS NOTE    [place]cent [place]  JYN:[id number] DOB: May 31, 1956 DOA: 10/25/2018 PCP: Patient, No Pcp Per   Brief Narrative:63 year old [place] male with history of perforated ulcer in 2003 status post repair, reflux, severe protein calorie malnutrition admitted 2/13 with abdominal pain for 2 weeks. Patient recently moved from [place] to [place]. Has no PCP. Has not seen a physician in years.  In ED, CT abdomen showed dilated thickened small bowel with transition point for obstruction with free air concerning for pneumoperitoneum. General surgery consulted manage conservatively with NG tube decompression and empiric antibiotic (Zosyn 2/14-->). Patient had a single column upper GI series performed on 2/19 that showed large ulcer without definite leak.   Bowel obstruction, history of gastric ulcer and significant muscle mass wasting raised concern for undiagnosed malignancy. Cancer markers including CA-125, CEA and CA 19-9 ordered. The later elevated to 202. MRI abdomen on 2/21 showed multiple intra-abdominal abscesses, 15.1 x 8.3 x 21 cm on the left abdomen and 8.3 x 14.8 x 21 cm in the right abdomen. There was also a masslike nonspecific wall thickening involving the proximal stomach. IR consulted and drained the abscesses and placed drains. Gram stain positive for GPC's. Culture negative. [facility] consulted on 2/22 and following.   In regards to malnutrition, started on TPN with the guidance of nutrition and pharmacy. NG tube retained per general surgery recommendation.  Assessment & Plan:   Principal Problem:   SBO (small bowel obstruction) (HCC) Active Problems:   AKI (acute kidney injury) (HCC)   Cachexia (HCC)   PUD (peptic ulcer disease)   Moderate alcohol consumption   GERD (gastroesophageal reflux disease)   Pneumoperitoneum   Protein-calorie malnutrition, severe (HCC)   Intra-abdominal abscess (HCC)   SBO/pneumoperitoneum/intra-abdominal abscesses/ascites:  -NG  remains in place per general surgery recommendation.  CT scan of the abdomen 11/08/2018 persistent left lateral abdominal fluid collection as well as 2 new additional fluid collection in the right lower quadrant and in the deep central pelvis.  Patient to have IR placed drains today-UGI on 2/19 showed large ulcer without definite leak -CEA and CEA from 125 within normal range. CA 19-9 elevated to 202. -MRI abdomen on 2/21 showed multiple intra-abdominal abscesses and nonspecific proximal stomach wall thickening -Abscesses drained and drains placed on 2/21 by IR.  Still draining purulent from both the drains. Gram stain positive for GPC's. Culture negative. -[facility] following -Zosyn 2/14--> -Continue TPN-appreciate dietitian and pharmacy help -Continue PPI. EGD canceled for today as patient having intra-abdominal drains placed.  AKI: Resolved. -Avoid nephrotoxic meds.  Severe protein calorie malnutrition: Improving. -Continue TPN-per nutrition and pharmacy -Monitor electrolytes  Normocytic anemia:Likely anemia of chronic disease. -Continue monitoring  Nutrition Problem: Severe Malnutrition Etiology: chronic illness(PUD with surgery  DVT prophylaxis:Lovenox Code Status:Full code Family Communication:No family available disposition Plan:Unknown  Consultants:  General surgery and GI and interventional radiology  Procedures:NG tube  abdominal drains 11/02/2018 Antimicrobials:Zosyn    Nutrition Problem: Severe Malnutrition Etiology: chronic illness(PUD with surgery)     Signs/Symptoms: energy intake < or equal to 75% for > or equal to 1 month, severe fat depletion, severe muscle depletion, percent weight loss    Interventions: TPN  Estimated body mass index is 16.16 kg/m as calculated from the following:   Height as of this encounter:  (1.88 m).   Weight as of this encounter: 57.1 kg.      Subjective:  Resting in bed no new complaints  waiting for IR to place drain waiting for  family to come in denies any diarrhea Objective: Vitals:   11/09/18 0840 11/09/18 0845 11/09/18 0850 11/09/18 0855  BP: 120/77 116/78 120/81 111/74  Pulse: 94  95 100  Resp: (!) 24 (!) 24 (!) 25 18  Temp:      TempSrc:      SpO2: 100%  100% 98%  Weight:      Height:        Intake/Output Summary (Last 24 hours) at 11/09/2018 0901 Last data filed at 11/09/2018 0750 Gross per 24 hour  Intake 1955.95 ml  Output 2880 ml  Net -924.05 ml   Filed Weights   10/25/18 2218 10/26/18 1258 11/02/18 1006  Weight: 54 kg 54.8 kg 57.1 kg    Examination:  General exam: Appears calm and comfortable  Respiratory system: Clear to auscultation. Respiratory effort normal. Cardiovascular system: S1 & S2 heard, RRR. No JVD, murmurs, rubs, gallops or clicks. No pedal edema. Gastrointestinal system: Abdomen is nondistended, soft and nontender. No organomegaly or masses felt. Normal bowel sounds heard.  2 drains one on each side of the abdomen draining purulent material Central nervous system: Alert and oriented. No focal neurological deficits. Extremities: Symmetric 5 x 5 power. Skin: No rashes, lesions or ulcers Psychiatry: Judgement and insight appear normal. Mood & affect appropriate.     Data Reviewed: I have personally reviewed following labs and imaging studies  CBC: Recent Labs  Lab 11/03/18 0408 11/04/18 0806 11/05/18 0429 11/07/18 0520  WBC 3.4* 4.5 5.1 6.8  NEUTROABS  --   --  2.7  --   HGB 9.5* 9.4* 9.1* 9.6*  HCT 29.8* 28.9* 28.2* 29.2*  MCV 103.8* 104.0* 102.5* 103.5*  PLT 308 303 280 308   Basic Metabolic Panel: Recent Labs  Lab 11/03/18 0408 11/04/18 0447 11/05/18 0429 11/07/18 0520 11/08/18 0349  NA 135 137 137 135 136  K 4.1 4.0 3.8 3.8 4.1  CL 101 104 104 102 102  CO2 28 28 29 30 29   GLUCOSE 116* 117* 114* 114* 108*  BUN 20 19 17 18 19   CREATININE 0.81 0.67 0.73 0.72 0.73  CALCIUM 7.9* 8.0* 8.0* 8.3* 8.5*  MG 1.9 2.0  1.9  --  1.9  PHOS 3.9 3.4 3.4  --  4.3   GFR: Estimated Creatinine Clearance: 75.3 mL/min (by C-G formula based on SCr of 0.73 mg/dL). Liver Function Tests: Recent Labs  Lab 11/05/18 0429 11/08/18 0349  AST 29 28  ALT 23 25  ALKPHOS 152* 178*  BILITOT 0.3 0.3  PROT 6.4* 7.3  ALBUMIN 1.7* 2.0*   No results for input(s): LIPASE, AMYLASE in the last 168 hours. No results for input(s): AMMONIA in the last 168 hours. Coagulation Profile: Recent Labs  Lab 11/08/18 1641  INR 1.0   Cardiac Enzymes: No results for input(s): CKTOTAL, CKMB, CKMBINDEX, TROPONINI in the last 168 hours. BNP (last 3 results) No results for input(s): PROBNP in the last 8760 hours. HbA1C: No results for input(s): HGBA1C in the last 72 hours. CBG: Recent Labs  Lab 11/04/18 1139 11/04/18 1803 11/05/18 0003 11/05/18 0652 11/06/18 2351  GLUCAP 107* 110* 105* 103* 105*   Lipid Profile: No results for input(s): CHOL, HDL, LDLCALC, TRIG, CHOLHDL, LDLDIRECT in the last 72 hours. Thyroid Function Tests: No results for input(s): TSH, T4TOTAL, FREET4, T3FREE, THYROIDAB in the last 72 hours. Anemia Panel: No results for input(s): VITAMINB12, FOLATE, FERRITIN, TIBC, IRON, RETICCTPCT in the last 72 hours. Sepsis Labs: No results for input(s): PROCALCITON, LATICACIDVEN in the  last 168 hours.  Recent Results (from the past 240 hour(s))  Aerobic/Anaerobic Culture (surgical/deep wound)     Status: None   Collection Time: 11/02/18  4:42 PM  Result Value Ref Range Status   Specimen Description   Final    ABSCESS Performed at [facility], [address], [place], [place] [address]    Special Requests INTRA ABDOMINAL  Final   Gram Stain   Final    ABUNDANT WBC PRESENT, PREDOMINANTLY PMN ABUNDANT GRAM POSITIVE COCCOBACILLUS    Culture   Final    RARE ERYSIPELOTHRIX RHUSIOPATHIAE Standardized susceptibility testing for this organism is not available. NO ANAEROBES ISOLATED Performed at  [facility], [address], [place], [place] [address]    Report Status 11/07/2018 FINAL  Final         Radiology Studies: Dg Abd 1 View  Result Date: 11/08/2018 CLINICAL DATA:  Intra-abdominal free air EXAM: ABDOMEN - 1 VIEW COMPARISON:  November 03, 2018 FINDINGS: The bowel gas pattern is normal. Nasogastric tube is identified distal tip probably in the proximal stomach. Free air in the left upper abdomen is unchanged. Changed to in bilateral abdomen are unchanged. IMPRESSION: Persistent free air is not significantly changed. No bowel obstruction. Electronically Signed   By: Sherian Rein M.D.   On: 11/08/2018 07:34   Ct Abdomen Pelvis W Contrast  Result Date: 11/08/2018 CLINICAL DATA:  Status post percutaneous catheter drainage large bilateral flank retroperitoneal fluid collections on 11/02/2018. EXAM: CT ABDOMEN AND PELVIS WITH CONTRAST TECHNIQUE: Multidetector CT imaging of the abdomen and pelvis was performed using the standard protocol following bolus administration of intravenous contrast. CONTRAST:  OMNIPAQUE IOHEXOL 300 MG/ML  SOLN COMPARISON:  CT of the abdomen and pelvis on 10/25/2018 and MRI of the abdomen on 11/02/2018 FINDINGS: Lower chest: Mild atelectasis at the right lung base. Hepatobiliary: No focal liver abnormality is seen. No gallstones, gallbladder wall thickening, or biliary dilatation. Pancreas: Unremarkable. No pancreatic ductal dilatation or surrounding inflammatory changes. Spleen: Normal in size without focal abnormality. Adrenals/Urinary Tract: Adrenal glands are unremarkable. Kidneys are normal, without renal calculi, focal lesion, or hydronephrosis. Bladder is unremarkable. Stomach/Bowel: The stomach is decompressed by a nasogastric catheter. There remains wall thickening involving the proximal stomach and body of the stomach. There remains some free intraperitoneal air in the left upper abdomen and left subphrenic space with overall decrease in free  air since the prior CT on 10/25/2018. No small bowel obstruction or significant colonic ileus. Vascular/Lymphatic: No significant vascular findings are present. No enlarged abdominal or pelvic lymph nodes. Reproductive: Prostate is unremarkable. Other: Drainage catheter on the right extends up to the posterior margin of the lower liver. There is a small amount residual fluid inferior to the liver. Drainage catheter on the left extends into a residual large left lateral abdominal abscess measuring up to 5.8 x 10.3 cm transversely. This collection also extends into the left pelvis. Additional right lower quadrant anterior peritoneal abscess just deep to the abdominal wall measures approximately 8 cm in transverse with extension into the right pelvis. Additional deep pelvic collection anterior to the rectum measures approximately 4.2 x 5.8 cm. There are some other small areas of fluid interspersed between small bowel loops in the central abdomen and throughout the pelvis. Musculoskeletal: No acute or significant osseous findings. IMPRESSION: 1. Decrease in free intraperitoneal air in the left upper abdomen and left subphrenic space. 2. Affective drainage of right lateral abdominal fluid collection by catheter placement likely  in the retroperitoneal space. 3. The left lateral abdominal fluid collection is still quite large after catheter placement and it is recommended that the drain be flushed appropriately. If this does not result in further fluid return, this catheter may need to be exchanged and upsized as well as slightly repositioned in the cavity. 4. Two additional undrained marginated fluid collections in the right lower quadrant and deep central pelvis. The right lower quadrant collection that extends into the right pelvis is just deep to the abdominal wall and would be amenable to percutaneous catheter drainage, if indicated. The deep pelvic collection is not currently approachable percutaneously.  Electronically Signed   By: Irish Lack M.D.   On: 11/08/2018 10:50        Scheduled Meds: . fentaNYL      . heparin injection (subcutaneous)  5,000 Units Subcutaneous Q8H  . iopamidol      . lidocaine      . midazolam      . pantoprazole (PROTONIX) IV  40 mg Intravenous Q12H  . sodium chloride flush  10-40 mL Intracatheter Q12H  . sodium chloride flush  5 mL Intracatheter Q8H   Continuous Infusions: . sodium chloride 50 mL/hr at 11/08/18 2348  . ceFAZolin    . piperacillin-tazobactam (ZOSYN)  IV 3.375 g (11/09/18 0509)  . TPN ADULT (ION)       LOS: 15 days     Alwyn Ren, MD [facility]  If 7PM-7AM, please contact night-coverage [email or web address] Password [facility] 11/09/2018, 9:01 AM

## 2018-11-10 MED ORDER — TRAVASOL 10 % IV SOLN
INTRAVENOUS | Status: AC
  Administered 2018-11-10: 18:00:00 via INTRAVENOUS
  Filled 2018-11-10: qty 1056

## 2018-11-10 MED ORDER — SODIUM CHLORIDE 0.9 % IV SOLN
INTRAVENOUS | Status: DC | PRN
  Administered 2018-11-10 – 2018-11-21 (×3): 500 mL via INTRAVENOUS

## 2018-11-10 NOTE — Progress Notes (Signed)
PROGRESS NOTE    Vincent Park  VHQ:[id number] DOB: 1956/08/16 DOA: 10/25/2018 PCP: Patient, No Pcp Per   Brief Narrative:63 year old [place] male with history of perforated ulcer in 2003 status post repair, reflux, severe protein calorie malnutrition admitted 2/13 with abdominal pain for 2 weeks. Patient recently moved from [place] to [place]. Has no PCP. Has not seen a physician in years.  In ED, CT abdomen showed dilated thickened small bowel with transition point for obstruction with free air concerning for pneumoperitoneum. General surgery consulted manage conservatively with NG tube decompression and empiric antibiotic (Zosyn 2/14-->). Patient had a single column upper GI series performed on 2/19 that showed large ulcer without definite leak.   Bowel obstruction, history of gastric ulcer and significant muscle mass wasting raised concern for undiagnosed malignancy. Cancer markers including CA-125, CEA and CA 19-9 ordered. The later elevated to 202. MRI abdomen on 2/21 showed multiple intra-abdominal abscesses, 15.1 x 8.3 x 21 cm on the left abdomen and 8.3 x 14.8 x 21 cm in the right abdomen. There was also a masslike nonspecific wall thickening involving the proximal stomach. IR consulted and drained the abscesses and placed drains. Gram stain positive for GPC's. Culture negative. [facility] consulted on 2/22 and following.   In regards to malnutrition, started on TPN with the guidance of nutrition and pharmacy. NG tube retained per general surgery recommendation.  Assessment & Plan:   Principal Problem:   SBO (small bowel obstruction) (HCC) Active Problems:   AKI (acute kidney injury) (HCC)   Cachexia (HCC)   PUD (peptic ulcer disease)   Moderate alcohol consumption   GERD (gastroesophageal reflux disease)   Pneumoperitoneum   Protein-calorie malnutrition, severe (HCC)   Intra-abdominal abscess (HCC)   SBO/pneumoperitoneum/intra-abdominal abscesses/ascites:  -NG  remains in place per general surgery recommendation.  CT scan of the abdomen 11/08/2018 persistent left lateral abdominal fluid collection as well as 2 new additional fluid collection in the right lower quadrant and in the deep central pelvis.   Had upsizing of right and left lower quadrant drains and placement of new drains on both sides.  Done 11/09/2018. -CEA and CEA from 125 within normal range. CA 19-9 elevated to 202.  -MRI abdomen on 2/21 showed multiple intra-abdominal abscesses and nonspecific proximal stomach wall thickening -Abscesses drained and drains placed on 2/21 by IR.Still draining purulent from both the drains. Gram stain positive for GPC's. Culture negative. -[facility] following -Zosyn 2/14--> -Continue TPN-appreciate dietitian and pharmacy help -Continue PPI. EGD canceled for today as patient having intra-abdominal drains placed.  AKI: Resolved. -Avoid nephrotoxic meds.  Severe protein calorie malnutrition: Improving. -Continue TPN-per nutrition and pharmacy -Monitor electrolytes  Normocytic anemia:Likely anemia of chronic disease. -Continue monitoring  Nutrition Problem: Severe Malnutrition Etiology: chronic illness(PUD with surgery  DVT prophylaxis:Lovenox Code Status:Full code Family Communication:No family available disposition Plan:Unknown  Consultants:  General surgery and GI and interventional radiology  Procedures:NG tube  abdominal drains 11/02/2018 Antimicrobials:Zosyn   Nutrition Problem: Severe Malnutrition Etiology: chronic illness(PUD with surgery)     Signs/Symptoms: energy intake < or equal to 75% for > or equal to 1 month, severe fat depletion, severe muscle depletion, percent weight loss    Interventions: TPN  Estimated body mass index is 16.16 kg/m as calculated from the following:   Height as of this encounter:  (1.88 m).   Weight as of this encounter: 57.1 kg.   Subjective: Resting in bed reports  having bowel movements passing gas denies new complaints he ambulates  in the room walks to the restroom by himself now has 4 drains altogether in his abdomen.  Mains on NG tube suctioning.  Objective: Vitals:   11/09/18 1202 11/09/18 1340 11/09/18 2138 11/10/18 0518  BP: 130/76 120/66 (!) 129/59 131/68  Pulse: 84 86 82 90  Resp: 16 15 (!) 22 20  Temp: 97.6 F (36.4 C) 98 F (36.7 C) 98.6 F (37 C) 98.6 F (37 C)  TempSrc: Oral Oral Oral Oral  SpO2: 100% 100% 100% 100%  Weight:      Height:        Intake/Output Summary (Last 24 hours) at 11/10/2018 1121 Last data filed at 11/10/2018 1000 Gross per 24 hour  Intake 2998.95 ml  Output 2725 ml  Net 273.95 ml   Filed Weights   10/25/18 2218 10/26/18 1258 11/02/18 1006  Weight: 54 kg 54.8 kg 57.1 kg    Examination:  General exam: Appears calm and comfortable  Respiratory system: Clear to auscultation. Respiratory effort normal. Cardiovascular system: S1 & S2 heard, RRR. No JVD, murmurs, rubs, gallops or clicks. No pedal edema. Gastrointestinal system: Abdomen is nondistended, soft and nontender. No organomegaly or masses felt. Normal bowel sounds heard. Central nervous system: Alert and oriented. No focal neurological deficits. Extremities: Symmetric 5 x 5 power. Skin: No rashes, lesions or ulcers Psychiatry: Judgement and insight appear normal. Mood & affect appropriate.     Data Reviewed: I have personally reviewed following labs and imaging studies  CBC: Recent Labs  Lab 11/04/18 0806 11/05/18 0429 11/07/18 0520  WBC 4.5 5.1 6.8  NEUTROABS  --  2.7  --   HGB 9.4* 9.1* 9.6*  HCT 28.9* 28.2* 29.2*  MCV 104.0* 102.5* 103.5*  PLT 303 280 308   Basic Metabolic Panel: Recent Labs  Lab 11/04/18 0447 11/05/18 0429 11/07/18 0520 11/08/18 0349  NA 137 137 135 136  K 4.0 3.8 3.8 4.1  CL 104 104 102 102  CO2 28 29 30 29   GLUCOSE 117* 114* 114* 108*  BUN 19 17 18 19   CREATININE 0.67 0.73 0.72 0.73  CALCIUM 8.0*  8.0* 8.3* 8.5*  MG 2.0 1.9  --  1.9  PHOS 3.4 3.4  --  4.3   GFR: Estimated Creatinine Clearance: 75.3 mL/min (by C-G formula based on SCr of 0.73 mg/dL). Liver Function Tests: Recent Labs  Lab 11/05/18 0429 11/08/18 0349  AST 29 28  ALT 23 25  ALKPHOS 152* 178*  BILITOT 0.3 0.3  PROT 6.4* 7.3  ALBUMIN 1.7* 2.0*   No results for input(s): LIPASE, AMYLASE in the last 168 hours. No results for input(s): AMMONIA in the last 168 hours. Coagulation Profile: Recent Labs  Lab 11/08/18 1641  INR 1.0   Cardiac Enzymes: No results for input(s): CKTOTAL, CKMB, CKMBINDEX, TROPONINI in the last 168 hours. BNP (last 3 results) No results for input(s): PROBNP in the last 8760 hours. HbA1C: No results for input(s): HGBA1C in the last 72 hours. CBG: Recent Labs  Lab 11/04/18 1139 11/04/18 1803 11/05/18 0003 11/05/18 0652 11/06/18 2351  GLUCAP 107* 110* 105* 103* 105*   Lipid Profile: No results for input(s): CHOL, HDL, LDLCALC, TRIG, CHOLHDL, LDLDIRECT in the last 72 hours. Thyroid Function Tests: No results for input(s): TSH, T4TOTAL, FREET4, T3FREE, THYROIDAB in the last 72 hours. Anemia Panel: No results for input(s): VITAMINB12, FOLATE, FERRITIN, TIBC, IRON, RETICCTPCT in the last 72 hours. Sepsis Labs: No results for input(s): PROCALCITON, LATICACIDVEN in the last 168 hours.  Recent Results (from  the past 240 hour(s))  Aerobic/Anaerobic Culture (surgical/deep wound)     Status: None   Collection Time: 11/02/18  4:42 PM  Result Value Ref Range Status   Specimen Description   Final    ABSCESS Performed at [facility], [address] [place], [place] [address]    Special Requests INTRA ABDOMINAL  Final   Gram Stain   Final    ABUNDANT WBC PRESENT, PREDOMINANTLY PMN ABUNDANT GRAM POSITIVE COCCOBACILLUS    Culture   Final    RARE ERYSIPELOTHRIX RHUSIOPATHIAE Standardized susceptibility testing for this organism is not available. NO ANAEROBES  ISOLATED Performed at [facility], [address] [place], [place] [address]    Report Status 11/07/2018 FINAL  Final         Radiology Studies: Ir Catheter Tube Change  Result Date: 11/09/2018 INDICATION: 63 year old male with florid infectious peritonitis and extensive intraperitoneal abscesses. He was previously treated by placement of bilateral percutaneous drainage catheters on 11/02/2018. Recurrent CT imaging demonstrates ineffective drainage. He presents today for drain exchange, up size and placement of additional drainage catheters. EXAM: 1. Drain injection, exchange and up size of the existing right upper quadrant drainage catheter. 2. Placement of a new right lower quadrant drainage catheter using ultrasound and fluoroscopic guidance. 3. Drain injection, exchange and up size of the existing left upper quadrant drainage catheter. 4. Placement of a new left lower quadrant drainage catheter using ultrasound and fluoroscopic guidance. MEDICATIONS: 2 g Ancef ANESTHESIA/SEDATION: Fentanyl 100 mcg IV; Versed 3 mg IV Moderate Sedation Time:  40 minutes The patient was continuously monitored during the procedure by the interventional radiology nurse under my direct supervision. FLUOROSCOPY TIME:  3 minutes 24 seconds, 10 mGy COMPLICATIONS: None immediate. PROCEDURE: Informed written consent was obtained from the patient after a thorough discussion of the procedural risks, benefits and alternatives. All questions were addressed. Maximal Sterile Barrier Technique was utilized including caps, mask, sterile gowns, sterile gloves, sterile drape, hand hygiene and skin antiseptic. A timeout was performed prior to the initiation of the procedure. Attention was first turned to the existing right upper quadrant drainage catheter which enters the peritoneal cavity from the right mid abdomen. A gentle hand injection of contrast material was performed. There is a persistent abscess cavity around the tip of  the catheter. The injected contrast material extends inferiorly along the right abdominal wall nearly to the skin entry site. The decision was made to proceed with drain exchange and up size. The drainage type also be transition to a biliary drain to facilitate drainage of the entire length of the collection. The retention suture was cut and released. The catheter was transected and removed over an Amplatz wire. The skin tract was dilated to 43 [place] and a 37 [place] biliary drainage catheter was advanced over the wire and formed. Injection through the biliary drain demonstrates its location within the right abdominal fluid collection. The drainage catheter was secured to the skin with 0 Prolene suture and connected to JP bulb suction. Attention was next turned to the right lower quadrant where there was a known fluid collection on the prior CT scan. Ultrasound reveals a complex fluid pocket consistent with peritoneal fluid. A skin entry site was marked. Local anesthesia was attained by infiltration with 1% lidocaine. A small dermatotomy was made. Under real-time sonographic guidance, an 18 gauge single wall needle was advanced into the fluid collection. A 0.018 wire was then advanced through the needle and the needle was removed.  The skin tract was dilated to 21 [place] and a Cook 14 [place] percutaneous drain was advanced into the fluid collection and formed. A hand injection of contrast material reveals that the fluid collection extends inferiorly into the right inguinal recess. Therefore, the catheter was manipulated into the more dependent aspect in the right inguinal recess. The catheter was secured to the skin with 0 Prolene suture and connected to JP bulb suction. Attention was next turned to the existing left upper quadrant drain. Again, a hand injection of contrast material was performed. This demonstrates a large persistent abscess cavity. The internal fluid appears quite thick. The decision was made to  proceed with up sizing of this drainage catheter to a larger drain to facilitate drainage. The retention suture was cut. The catheter was transected and removed over an Amplatz wire. The skin tract was dilated to 25 [place] and a 16 [place] Thal drainage catheter was advanced over the wire and formed in the left upper quadrant. The catheter was connected to JP bulb suction and secured to the skin with 0 Prolene suture. This fluid collection is known to extend significantly along the left abdominal wall and into the pelvis. Therefore, ultrasound was again used to evaluate the fluid collection. There is a large fluid collection in the left abdomen. A suitable skin entry site was selected and marked. Local anesthesia was attained by infiltration with 1% lidocaine. A small dermatotomy was made. Under real-time sonographic guidance, an 18 gauge single wall needle was used to puncture the fluid collection. An Amplatz wire was advanced in an inferior direction toward the left inguinal recess. The 18 gauge needle was removed. The skin tract was dilated to 68 [place] and a 6 [place] biliary drain was advanced over the wire and positioned in the anatomic pelvis. Contrast injection confirms that the catheter is located within the peritoneal fluid. The catheter was flushed and connected to JP bulb suction and secured to the skin with 0 Prolene suture. IMPRESSION: 1. Exchange and up size of existing right upper quadrant drainage catheter to a 14 [place] biliary drain. 2. Placement of a new right lower quadrant 14 French all-purpose drainage catheter. 3. Exchange and up size of existing left upper quadrant drainage catheter to a 16 [place] Thal drain. 4. Placement of a new left lower quadrant 14 French biliary drainage catheter. PLAN: 1. Maintain all tubes to JP bulb suction. 2. All tubes should be flushed at least once per shift. 3. Once drain output has diminished or ceased, additional CT imaging of the abdomen and pelvis with  intravenous contrast should be obtained prior to drain removal. Signed, Sterling Big, MD, RPVI [facility] Radiology Electronically Signed   By: Malachy Moan M.D.   On: 11/09/2018 10:43   Ir Catheter Tube Change  Result Date: 11/09/2018 INDICATION: 63 year old male with florid infectious peritonitis and extensive intraperitoneal abscesses. He was previously treated by placement of bilateral percutaneous drainage catheters on 11/02/2018. Recurrent CT imaging demonstrates ineffective drainage. He presents today for drain exchange, up size and placement of additional drainage catheters. EXAM: 1. Drain injection, exchange and up size of the existing right upper quadrant drainage catheter. 2. Placement of a new right lower quadrant drainage catheter using ultrasound and fluoroscopic guidance. 3. Drain injection, exchange and up size of the existing left upper quadrant drainage catheter. 4. Placement of a new left lower quadrant drainage catheter using ultrasound and fluoroscopic guidance. MEDICATIONS: 2 g Ancef ANESTHESIA/SEDATION: Fentanyl 100 mcg IV;  Versed 3 mg IV Moderate Sedation Time:  40 minutes The patient was continuously monitored during the procedure by the interventional radiology nurse under my direct supervision. FLUOROSCOPY TIME:  3 minutes 24 seconds, 10 mGy COMPLICATIONS: None immediate. PROCEDURE: Informed written consent was obtained from the patient after a thorough discussion of the procedural risks, benefits and alternatives. All questions were addressed. Maximal Sterile Barrier Technique was utilized including caps, mask, sterile gowns, sterile gloves, sterile drape, hand hygiene and skin antiseptic. A timeout was performed prior to the initiation of the procedure. Attention was first turned to the existing right upper quadrant drainage catheter which enters the peritoneal cavity from the right mid abdomen. A gentle hand injection of  contrast material was performed. There is a persistent abscess cavity around the tip of the catheter. The injected contrast material extends inferiorly along the right abdominal wall nearly to the skin entry site. The decision was made to proceed with drain exchange and up size. The drainage type also be transition to a biliary drain to facilitate drainage of the entire length of the collection. The retention suture was cut and released. The catheter was transected and removed over an Amplatz wire. The skin tract was dilated to 15 [place] and a 80 [place] biliary drainage catheter was advanced over the wire and formed. Injection through the biliary drain demonstrates its location within the right abdominal fluid collection. The drainage catheter was secured to the skin with 0 Prolene suture and connected to JP bulb suction. Attention was next turned to the right lower quadrant where there was a known fluid collection on the prior CT scan. Ultrasound reveals a complex fluid pocket consistent with peritoneal fluid. A skin entry site was marked. Local anesthesia was attained by infiltration with 1% lidocaine. A small dermatotomy was made. Under real-time sonographic guidance, an 18 gauge single wall needle was advanced into the fluid collection. A 0.018 wire was then advanced through the needle and the needle was removed. The skin tract was dilated to 39 [place] and a Cook 14 [place] percutaneous drain was advanced into the fluid collection and formed. A hand injection of contrast material reveals that the fluid collection extends inferiorly into the right inguinal recess. Therefore, the catheter was manipulated into the more dependent aspect in the right inguinal recess. The catheter was secured to the skin with 0 Prolene suture and connected to JP bulb suction. Attention was next turned to the existing left upper quadrant drain. Again, a hand injection of contrast material was performed. This demonstrates a large  persistent abscess cavity. The internal fluid appears quite thick. The decision was made to proceed with up sizing of this drainage catheter to a larger drain to facilitate drainage. The retention suture was cut. The catheter was transected and removed over an Amplatz wire. The skin tract was dilated to 66 [place] and a 16 [place] Thal drainage catheter was advanced over the wire and formed in the left upper quadrant. The catheter was connected to JP bulb suction and secured to the skin with 0 Prolene suture. This fluid collection is known to extend significantly along the left abdominal wall and into the pelvis. Therefore, ultrasound was again used to evaluate the fluid collection. There is a large fluid collection in the left abdomen. A suitable skin entry site was selected and marked. Local anesthesia was attained by infiltration with 1% lidocaine. A small dermatotomy was made. Under real-time sonographic guidance, an 18 gauge single wall needle was used to puncture the  fluid collection. An Amplatz wire was advanced in an inferior direction toward the left inguinal recess. The 18 gauge needle was removed. The skin tract was dilated to 23 [place] and a 30 [place] biliary drain was advanced over the wire and positioned in the anatomic pelvis. Contrast injection confirms that the catheter is located within the peritoneal fluid. The catheter was flushed and connected to JP bulb suction and secured to the skin with 0 Prolene suture. IMPRESSION: 1. Exchange and up size of existing right upper quadrant drainage catheter to a 14 [place] biliary drain. 2. Placement of a new right lower quadrant 14 French all-purpose drainage catheter. 3. Exchange and up size of existing left upper quadrant drainage catheter to a 16 [place] Thal drain. 4. Placement of a new left lower quadrant 14 French biliary drainage catheter. PLAN: 1. Maintain all tubes to JP bulb suction. 2. All tubes should be flushed at least once per shift. 3. Once drain  output has diminished or ceased, additional CT imaging of the abdomen and pelvis with intravenous contrast should be obtained prior to drain removal. Signed, Sterling Big, MD, RPVI [facility] Radiology Electronically Signed   By: Malachy Moan M.D.   On: 11/09/2018 10:43   Ir US Guide Bx Asp/drain  Result Date: 11/09/2018 INDICATION: 63 year old male with florid infectious peritonitis and extensive intraperitoneal abscesses. He was previously treated by placement of bilateral percutaneous drainage catheters on 11/02/2018. Recurrent CT imaging demonstrates ineffective drainage. He presents today for drain exchange, up size and placement of additional drainage catheters. EXAM: 1. Drain injection, exchange and up size of the existing right upper quadrant drainage catheter. 2. Placement of a new right lower quadrant drainage catheter using ultrasound and fluoroscopic guidance. 3. Drain injection, exchange and up size of the existing left upper quadrant drainage catheter. 4. Placement of a new left lower quadrant drainage catheter using ultrasound and fluoroscopic guidance. MEDICATIONS: 2 g Ancef ANESTHESIA/SEDATION: Fentanyl 100 mcg IV; Versed 3 mg IV Moderate Sedation Time:  40 minutes The patient was continuously monitored during the procedure by the interventional radiology nurse under my direct supervision. FLUOROSCOPY TIME:  3 minutes 24 seconds, 10 mGy COMPLICATIONS: None immediate. PROCEDURE: Informed written consent was obtained from the patient after a thorough discussion of the procedural risks, benefits and alternatives. All questions were addressed. Maximal Sterile Barrier Technique was utilized including caps, mask, sterile gowns, sterile gloves, sterile drape, hand hygiene and skin antiseptic. A timeout was performed prior to the initiation of the procedure. Attention was first turned to the existing right upper quadrant drainage catheter  which enters the peritoneal cavity from the right mid abdomen. A gentle hand injection of contrast material was performed. There is a persistent abscess cavity around the tip of the catheter. The injected contrast material extends inferiorly along the right abdominal wall nearly to the skin entry site. The decision was made to proceed with drain exchange and up size. The drainage type also be transition to a biliary drain to facilitate drainage of the entire length of the collection. The retention suture was cut and released. The catheter was transected and removed over an Amplatz wire. The skin tract was dilated to 37 [place] and a 7 [place] biliary drainage catheter was advanced over the wire and formed. Injection through the biliary drain demonstrates its location within the right abdominal fluid collection. The drainage catheter was secured to the skin with 0 Prolene suture and connected to JP bulb suction. Attention was  next turned to the right lower quadrant where there was a known fluid collection on the prior CT scan. Ultrasound reveals a complex fluid pocket consistent with peritoneal fluid. A skin entry site was marked. Local anesthesia was attained by infiltration with 1% lidocaine. A small dermatotomy was made. Under real-time sonographic guidance, an 18 gauge single wall needle was advanced into the fluid collection. A 0.018 wire was then advanced through the needle and the needle was removed. The skin tract was dilated to 52 [place] and a Cook 14 [place] percutaneous drain was advanced into the fluid collection and formed. A hand injection of contrast material reveals that the fluid collection extends inferiorly into the right inguinal recess. Therefore, the catheter was manipulated into the more dependent aspect in the right inguinal recess. The catheter was secured to the skin with 0 Prolene suture and connected to JP bulb suction. Attention was next turned to the existing left upper quadrant drain.  Again, a hand injection of contrast material was performed. This demonstrates a large persistent abscess cavity. The internal fluid appears quite thick. The decision was made to proceed with up sizing of this drainage catheter to a larger drain to facilitate drainage. The retention suture was cut. The catheter was transected and removed over an Amplatz wire. The skin tract was dilated to 18 [place] and a 16 [place] Thal drainage catheter was advanced over the wire and formed in the left upper quadrant. The catheter was connected to JP bulb suction and secured to the skin with 0 Prolene suture. This fluid collection is known to extend significantly along the left abdominal wall and into the pelvis. Therefore, ultrasound was again used to evaluate the fluid collection. There is a large fluid collection in the left abdomen. A suitable skin entry site was selected and marked. Local anesthesia was attained by infiltration with 1% lidocaine. A small dermatotomy was made. Under real-time sonographic guidance, an 18 gauge single wall needle was used to puncture the fluid collection. An Amplatz wire was advanced in an inferior direction toward the left inguinal recess. The 18 gauge needle was removed. The skin tract was dilated to 86 [place] and a 60 [place] biliary drain was advanced over the wire and positioned in the anatomic pelvis. Contrast injection confirms that the catheter is located within the peritoneal fluid. The catheter was flushed and connected to JP bulb suction and secured to the skin with 0 Prolene suture. IMPRESSION: 1. Exchange and up size of existing right upper quadrant drainage catheter to a 14 [place] biliary drain. 2. Placement of a new right lower quadrant 14 French all-purpose drainage catheter. 3. Exchange and up size of existing left upper quadrant drainage catheter to a 16 [place] Thal drain. 4. Placement of a new left lower quadrant 14 French biliary drainage catheter. PLAN: 1. Maintain all tubes to  JP bulb suction. 2. All tubes should be flushed at least once per shift. 3. Once drain output has diminished or ceased, additional CT imaging of the abdomen and pelvis with intravenous contrast should be obtained prior to drain removal. Signed, Sterling Big, MD, RPVI [facility] Radiology Electronically Signed   By: Malachy Moan M.D.   On: 11/09/2018 10:43   Ir US Guide Bx Asp/drain  Result Date: 11/09/2018 INDICATION: 63 year old male with florid infectious peritonitis and extensive intraperitoneal abscesses. He was previously treated by placement of bilateral percutaneous drainage catheters on 11/02/2018. Recurrent CT imaging demonstrates ineffective drainage. He presents today for  drain exchange, up size and placement of additional drainage catheters. EXAM: 1. Drain injection, exchange and up size of the existing right upper quadrant drainage catheter. 2. Placement of a new right lower quadrant drainage catheter using ultrasound and fluoroscopic guidance. 3. Drain injection, exchange and up size of the existing left upper quadrant drainage catheter. 4. Placement of a new left lower quadrant drainage catheter using ultrasound and fluoroscopic guidance. MEDICATIONS: 2 g Ancef ANESTHESIA/SEDATION: Fentanyl 100 mcg IV; Versed 3 mg IV Moderate Sedation Time:  40 minutes The patient was continuously monitored during the procedure by the interventional radiology nurse under my direct supervision. FLUOROSCOPY TIME:  3 minutes 24 seconds, 10 mGy COMPLICATIONS: None immediate. PROCEDURE: Informed written consent was obtained from the patient after a thorough discussion of the procedural risks, benefits and alternatives. All questions were addressed. Maximal Sterile Barrier Technique was utilized including caps, mask, sterile gowns, sterile gloves, sterile drape, hand hygiene and skin antiseptic. A timeout was performed prior to the initiation of the  procedure. Attention was first turned to the existing right upper quadrant drainage catheter which enters the peritoneal cavity from the right mid abdomen. A gentle hand injection of contrast material was performed. There is a persistent abscess cavity around the tip of the catheter. The injected contrast material extends inferiorly along the right abdominal wall nearly to the skin entry site. The decision was made to proceed with drain exchange and up size. The drainage type also be transition to a biliary drain to facilitate drainage of the entire length of the collection. The retention suture was cut and released. The catheter was transected and removed over an Amplatz wire. The skin tract was dilated to 47 [place] and a 50 [place] biliary drainage catheter was advanced over the wire and formed. Injection through the biliary drain demonstrates its location within the right abdominal fluid collection. The drainage catheter was secured to the skin with 0 Prolene suture and connected to JP bulb suction. Attention was next turned to the right lower quadrant where there was a known fluid collection on the prior CT scan. Ultrasound reveals a complex fluid pocket consistent with peritoneal fluid. A skin entry site was marked. Local anesthesia was attained by infiltration with 1% lidocaine. A small dermatotomy was made. Under real-time sonographic guidance, an 18 gauge single wall needle was advanced into the fluid collection. A 0.018 wire was then advanced through the needle and the needle was removed. The skin tract was dilated to 18 [place] and a [place] 14 [place] percutaneous drain was advanced into the fluid collection and formed. A hand injection of contrast material reveals that the fluid collection extends inferiorly into the right inguinal recess. Therefore, the catheter was manipulated into the more dependent aspect in the right inguinal recess. The catheter was secured to the skin with 0 Prolene suture and connected  to JP bulb suction. Attention was next turned to the existing left upper quadrant drain. Again, a hand injection of contrast material was performed. This demonstrates a large persistent abscess cavity. The internal fluid appears quite thick. The decision was made to proceed with up sizing of this drainage catheter to a larger drain to facilitate drainage. The retention suture was cut. The catheter was transected and removed over an Amplatz wire. The skin tract was dilated to 57 [place] and a 16 [place] Thal drainage catheter was advanced over the wire and formed in the left upper quadrant. The catheter was connected to JP bulb suction and secured to the  skin with 0 Prolene suture. This fluid collection is known to extend significantly along the left abdominal wall and into the pelvis. Therefore, ultrasound was again used to evaluate the fluid collection. There is a large fluid collection in the left abdomen. A suitable skin entry site was selected and marked. Local anesthesia was attained by infiltration with 1% lidocaine. A small dermatotomy was made. Under real-time sonographic guidance, an 18 gauge single wall needle was used to puncture the fluid collection. An Amplatz wire was advanced in an inferior direction toward the left inguinal recess. The 18 gauge needle was removed. The skin tract was dilated to 58 [place] and a 35 [place] biliary drain was advanced over the wire and positioned in the anatomic pelvis. Contrast injection confirms that the catheter is located within the peritoneal fluid. The catheter was flushed and connected to JP bulb suction and secured to the skin with 0 Prolene suture. IMPRESSION: 1. Exchange and up size of existing right upper quadrant drainage catheter to a 14 [place] biliary drain. 2. Placement of a new right lower quadrant 14 French all-purpose drainage catheter. 3. Exchange and up size of existing left upper quadrant drainage catheter to a 16 [place] Thal drain. 4. Placement of a new  left lower quadrant 14 French biliary drainage catheter. PLAN: 1. Maintain all tubes to JP bulb suction. 2. All tubes should be flushed at least once per shift. 3. Once drain output has diminished or ceased, additional CT imaging of the abdomen and pelvis with intravenous contrast should be obtained prior to drain removal. Signed, Sterling Big, MD, RPVI [facility] Radiology Electronically Signed   By: Malachy Moan M.D.   On: 11/09/2018 10:43        Scheduled Meds: . heparin injection (subcutaneous)  5,000 Units Subcutaneous Q8H  . pantoprazole (PROTONIX) IV  40 mg Intravenous Q12H  . sodium chloride flush  10-40 mL Intracatheter Q12H  . sodium chloride flush  5 mL Intracatheter Q8H  . sodium chloride flush  5 mL Intracatheter Q8H   Continuous Infusions: . sodium chloride 50 mL/hr at 11/09/18 1952  . piperacillin-tazobactam (ZOSYN)  IV Stopped (11/10/18 0924)  . TPN ADULT (ION) 80 mL/hr at 11/09/18 1915     LOS: 16 days    Alwyn Ren, MD [facility]  If 7PM-7AM, please contact night-coverage [email or web address] Password [facility] 11/10/2018, 11:21 AM

## 2018-11-10 NOTE — Progress Notes (Signed)
PHARMACY - ADULT TOTAL PARENTERAL NUTRITION CONSULT NOTE   Pharmacy Consult for TPN Indication: bowel rest, severe malnutrition PTA  Patient Measurements: Height: _0  (188 cm) Weight: 125 lb 14.1 oz (57.1 kg) IBW/kg (Calculated) : 82.2 TPN AdjBW (KG): 54 Body mass index is 16.16 kg/m. Usual Weight: unknown as patient is poor historian, but reports significant weight loss over the past year  HPI: 3 yoM with PMH PUD with perforated gastric ulcer s/p repair in 2003, GERD presents with worsening chronic abdominal pain and associated weight loss. Cachectic appearing. CT reveals ascites with concern for SBO vs recurrent ulcer disease. Pt is NPO. Consulted by surgery for TPN management. Pt will require nutritional support prior to any surgical intervention.  Current Nutrition: NPO IVF: NS at 50 mL/hr  Central access: Double lumen PICC placed 2/14 TPN start date: 2/14  Significant events:  2/27 KUB w/ persistent free air, CT scan w/ persistent abd fluid collection plus 2 additional fluid collections RLQ anda deep central pelvis 2/28 to IR for drain exchanges/upsize x 2, place 2 more drains if possible  ASSESSMENT                                                                                               Today, 11/10/2018:  Glucose (goal 100-150): no longer checking CBGs d/t excellent control; SBGs stable within goal range  Electrolytes - stable as of 2/27  Renal - SCr, bicarb, BUN, UOP stable WNL  LFTs, Alk Phos rising slowly as of 2/27  TGs - WNL on 2/24  Prealbumin - still moderately low, coming up on TPN  NUTRITIONAL GOALS                                                                                 RD recs (per note on 2/14): Kcal: 1900 - 2100 grams Protein: 95 - 115 g Fluid: >/= 1.9 L/day  PLAN                                                                                                               At 1800 today:  Continue TPN at goal rate of 80 mL/hr  providing 100% of patient needs with 2070 kcal and 106 g protein  Electrolytes: Mag slightly increased; Cl:Ac = 1:1  No SSI needed  Continue IVF as ordered  TPN  lab panels on Mondays & Thursdays  LFTs tomorrow AM  Reuel Boom, PharmD, BCPS [phone] 11/10/2018, 11:32 AM

## 2018-11-11 LAB — HEPATIC FUNCTION PANEL
ALK PHOS: 206 U/L — AB (ref 38–126)
ALT: 23 U/L (ref 0–44)
AST: 27 U/L (ref 15–41)
Albumin: 1.9 g/dL — ABNORMAL LOW (ref 3.5–5.0)
Bilirubin, Direct: <0.1 mg/dL (ref 0.0–0.2)
Total Bilirubin: 0.3 mg/dL (ref 0.3–1.2)
Total Protein: 7.2 g/dL (ref 6.5–8.1)

## 2018-11-11 MED ORDER — TRAVASOL 10 % IV SOLN
INTRAVENOUS | Status: AC
  Administered 2018-11-11: 19:00:00 via INTRAVENOUS
  Filled 2018-11-11: qty 1056

## 2018-11-11 NOTE — Progress Notes (Signed)
PROGRESS NOTE    Vincent Park  ZOX:[id number] DOB: November 01, 1955 DOA: 10/25/2018 PCP: Patient, No Pcp Per   Brief Narrative:63 year old [place] male with history of perforated ulcer in 2003 status post repair, reflux, severe protein calorie malnutrition admitted 2/13 with abdominal pain for 2 weeks. Patient recently moved from [place] to [place]. Has no PCP. Has not seen a physician in years.  In ED, CT abdomen showed dilated thickened small bowel with transition point for obstruction with free air concerning for pneumoperitoneum. General surgery consulted manage conservatively with NG tube decompression and empiric antibiotic (Zosyn 2/14-->). Patient had a single column upper GI series performed on 2/19 that showed large ulcer without definite leak.   Bowel obstruction, history of gastric ulcer and significant muscle mass wasting raised concern for undiagnosed malignancy. Cancer markers including CA-125, CEA and CA 19-9 ordered. The later elevated to 202. MRI abdomen on 2/21 showed multiple intra-abdominal abscesses, 15.1 x 8.3 x 21 cm on the left abdomen and 8.3 x 14.8 x 21 cm in the right abdomen. There was also a masslike nonspecific wall thickening involving the proximal stomach. IR consulted and drained the abscesses and placed drains. Gram stain positive for GPC's. Culture negative. [facility] consulted on 2/22 and following.   In regards to malnutrition, started on TPN with the guidance of nutrition and pharmacy. NG tube retained per general surgery recommendation.  Assessment & Plan:   Principal Problem:   SBO (small bowel obstruction) (HCC) Active Problems:   AKI (acute kidney injury) (HCC)   Cachexia (HCC)   PUD (peptic ulcer disease)   Moderate alcohol consumption   GERD (gastroesophageal reflux disease)   Pneumoperitoneum   Protein-calorie malnutrition, severe (HCC)   Intra-abdominal abscess (HCC)  SBO/pneumoperitoneum/intra-abdominal abscesses/ascites:  -NG  remains in place per general surgery recommendation.CT scan of the abdomen 11/08/2018 persistent left lateral abdominal fluid collection as well as 2 new additional fluid collection in the right lower quadrant and in the deep central pelvis.   Had upsizing of right and left lower quadrant drains and placement of new drains on both sides.  Done 11/09/2018. -CEA and CEA from 125 within normal range. CA 19-9 elevated to 202.  -MRI abdomen on 2/21 showed multiple intra-abdominal abscesses and nonspecific proximal stomach wall thickening -Abscesses drained and drains placed on 2/21 by IR.Still draining purulent from both the drains. Gram stain positive for GPC's. Culture negative. -[facility] following -Zosyn 2/14--> -Continue TPN-appreciate dietitian and pharmacy help -Continue PPI. EGD was  canceled as patient having intra-abdominal drains placed.  AKI: Resolved. -Avoid nephrotoxic meds.  Severe protein calorie malnutrition: Improving. -Continue TPN-per nutrition and pharmacy -Monitor electrolytes  Normocytic anemia:Likely anemia of chronic disease. -Continue monitoring  Nutrition Problem: Severe Malnutrition Etiology: chronic illness(PUD with surgery  DVT prophylaxis:Lovenox Code Status:Full code Family Communication:No family available disposition Plan:Unknown  Consultants:  General surgery and GI and interventional radiology  Procedures:NG tube  abdominal drains 11/02/2018 Antimicrobials:Zosyn   Nutrition Problem: Severe Malnutrition Etiology: chronic illness(PUD with surgery)     Signs/Symptoms: energy intake < or equal to 75% for > or equal to 1 month, severe fat depletion, severe muscle depletion, percent weight loss    Interventions: TPN  Estimated body mass index is 16.16 kg/m as calculated from the following:   Height as of this encounter:  (1.88 m).   Weight as of this encounter: 57.1 kg.       Subjective:he has no new  complaints  Objective: Vitals:   11/10/18 0518 11/10/18 1512  11/10/18 2039 11/11/18 0644  BP: 131/68 (!) 98/48 140/64 (!) 124/98  Pulse: 90 89 95 90  Resp: 20 15 18 18   Temp: 98.6 F (37 C) 97.6 F (36.4 C) 98.1 F (36.7 C) 98 F (36.7 C)  TempSrc: Oral Oral Oral Oral  SpO2: 100% 100% 98% 100%  Weight:      Height:        Intake/Output Summary (Last 24 hours) at 11/11/2018 0927 Last data filed at 11/11/2018 0726 Gross per 24 hour  Intake 3397.05 ml  Output 2780 ml  Net 617.05 ml   Filed Weights   10/25/18 2218 10/26/18 1258 11/02/18 1006  Weight: 54 kg 54.8 kg 57.1 kg    Examination:  General exam: Appears calm and comfortable  Respiratory system: Clear to auscultation. Respiratory effort normal. Cardiovascular system: S1 & S2 heard, RRR. No JVD, murmurs, rubs, gallops or clicks. No pedal edema. Gastrointestinal system: Abdomen is nondistended, soft and nontender. No organomegaly or masses felt. Normal bowel sounds heard.2 new drains with blood tinged drainage and 2 old drains with purulent drainage decreasing Central nervous system: Alert and oriented. No focal neurological deficits. Extremities: Symmetric 5 x 5 power. Skin: No rashes, lesions or ulcers Psychiatry: Judgement and insight appear normal. Mood & affect appropriate.     Data Reviewed: I have personally reviewed following labs and imaging studies  CBC: Recent Labs  Lab 11/05/18 0429 11/07/18 0520  WBC 5.1 6.8  NEUTROABS 2.7  --   HGB 9.1* 9.6*  HCT 28.2* 29.2*  MCV 102.5* 103.5*  PLT 280 308   Basic Metabolic Panel: Recent Labs  Lab 11/05/18 0429 11/07/18 0520 11/08/18 0349  NA 137 135 136  K 3.8 3.8 4.1  CL 104 102 102  CO2 29 30 29   GLUCOSE 114* 114* 108*  BUN 17 18 19   CREATININE 0.73 0.72 0.73  CALCIUM 8.0* 8.3* 8.5*  MG 1.9  --  1.9  PHOS 3.4  --  4.3   GFR: Estimated Creatinine Clearance: 75.3 mL/min (by C-G formula based on SCr of 0.73 mg/dL). Liver Function Tests: Recent  Labs  Lab 11/05/18 0429 11/08/18 0349 11/11/18 0519  AST 29 28 27   ALT 23 25 23   ALKPHOS 152* 178* 206*  BILITOT 0.3 0.3 0.3  PROT 6.4* 7.3 7.2  ALBUMIN 1.7* 2.0* 1.9*   No results for input(s): LIPASE, AMYLASE in the last 168 hours. No results for input(s): AMMONIA in the last 168 hours. Coagulation Profile: Recent Labs  Lab 11/08/18 1641  INR 1.0   Cardiac Enzymes: No results for input(s): CKTOTAL, CKMB, CKMBINDEX, TROPONINI in the last 168 hours. BNP (last 3 results) No results for input(s): PROBNP in the last 8760 hours. HbA1C: No results for input(s): HGBA1C in the last 72 hours. CBG: Recent Labs  Lab 11/04/18 1139 11/04/18 1803 11/05/18 0003 11/05/18 0652 11/06/18 2351  GLUCAP 107* 110* 105* 103* 105*   Lipid Profile: No results for input(s): CHOL, HDL, LDLCALC, TRIG, CHOLHDL, LDLDIRECT in the last 72 hours. Thyroid Function Tests: No results for input(s): TSH, T4TOTAL, FREET4, T3FREE, THYROIDAB in the last 72 hours. Anemia Panel: No results for input(s): VITAMINB12, FOLATE, FERRITIN, TIBC, IRON, RETICCTPCT in the last 72 hours. Sepsis Labs: No results for input(s): PROCALCITON, LATICACIDVEN in the last 168 hours.  Recent Results (from the past 240 hour(s))  Aerobic/Anaerobic Culture (surgical/deep wound)     Status: None   Collection Time: 11/02/18  4:42 PM  Result Value Ref Range Status   Specimen  Description   Final    ABSCESS Performed at [facility], [address] [place], [place] [address]    Special Requests INTRA ABDOMINAL  Final   Gram Stain   Final    ABUNDANT WBC PRESENT, PREDOMINANTLY PMN ABUNDANT GRAM POSITIVE COCCOBACILLUS    Culture   Final    RARE ERYSIPELOTHRIX RHUSIOPATHIAE Standardized susceptibility testing for this organism is not available. NO ANAEROBES ISOLATED Performed at [facility], [address], [place], [place] [address]    Report Status 11/07/2018 FINAL  Final         Radiology  Studies: Ir Catheter Tube Change  Result Date: 11/09/2018 INDICATION: 63 year old male with florid infectious peritonitis and extensive intraperitoneal abscesses. He was previously treated by placement of bilateral percutaneous drainage catheters on 11/02/2018. Recurrent CT imaging demonstrates ineffective drainage. He presents today for drain exchange, up size and placement of additional drainage catheters. EXAM: 1. Drain injection, exchange and up size of the existing right upper quadrant drainage catheter. 2. Placement of a new right lower quadrant drainage catheter using ultrasound and fluoroscopic guidance. 3. Drain injection, exchange and up size of the existing left upper quadrant drainage catheter. 4. Placement of a new left lower quadrant drainage catheter using ultrasound and fluoroscopic guidance. MEDICATIONS: 2 g Ancef ANESTHESIA/SEDATION: Fentanyl 100 mcg IV; Versed 3 mg IV Moderate Sedation Time:  40 minutes The patient was continuously monitored during the procedure by the interventional radiology nurse under my direct supervision. FLUOROSCOPY TIME:  3 minutes 24 seconds, 10 mGy COMPLICATIONS: None immediate. PROCEDURE: Informed written consent was obtained from the patient after a thorough discussion of the procedural risks, benefits and alternatives. All questions were addressed. Maximal Sterile Barrier Technique was utilized including caps, mask, sterile gowns, sterile gloves, sterile drape, hand hygiene and skin antiseptic. A timeout was performed prior to the initiation of the procedure. Attention was first turned to the existing right upper quadrant drainage catheter which enters the peritoneal cavity from the right mid abdomen. A gentle hand injection of contrast material was performed. There is a persistent abscess cavity around the tip of the catheter. The injected contrast material extends inferiorly along the right abdominal wall nearly to the skin entry site. The decision was made to  proceed with drain exchange and up size. The drainage type also be transition to a biliary drain to facilitate drainage of the entire length of the collection. The retention suture was cut and released. The catheter was transected and removed over an Amplatz wire. The skin tract was dilated to 53 [place] and a 88 [place] biliary drainage catheter was advanced over the wire and formed. Injection through the biliary drain demonstrates its location within the right abdominal fluid collection. The drainage catheter was secured to the skin with 0 Prolene suture and connected to JP bulb suction. Attention was next turned to the right lower quadrant where there was a known fluid collection on the prior CT scan. Ultrasound reveals a complex fluid pocket consistent with peritoneal fluid. A skin entry site was marked. Local anesthesia was attained by infiltration with 1% lidocaine. A small dermatotomy was made. Under real-time sonographic guidance, an 18 gauge single wall needle was advanced into the fluid collection. A 0.018 wire was then advanced through the needle and the needle was removed. The skin tract was dilated to 63 [place] and a Cook 14 [place] percutaneous drain was advanced into the fluid collection and formed. A hand injection of contrast material reveals that the  fluid collection extends inferiorly into the right inguinal recess. Therefore, the catheter was manipulated into the more dependent aspect in the right inguinal recess. The catheter was secured to the skin with 0 Prolene suture and connected to JP bulb suction. Attention was next turned to the existing left upper quadrant drain. Again, a hand injection of contrast material was performed. This demonstrates a large persistent abscess cavity. The internal fluid appears quite thick. The decision was made to proceed with up sizing of this drainage catheter to a larger drain to facilitate drainage. The retention suture was cut. The catheter was transected and  removed over an Amplatz wire. The skin tract was dilated to 56 [place] and a 16 [place] Thal drainage catheter was advanced over the wire and formed in the left upper quadrant. The catheter was connected to JP bulb suction and secured to the skin with 0 Prolene suture. This fluid collection is known to extend significantly along the left abdominal wall and into the pelvis. Therefore, ultrasound was again used to evaluate the fluid collection. There is a large fluid collection in the left abdomen. A suitable skin entry site was selected and marked. Local anesthesia was attained by infiltration with 1% lidocaine. A small dermatotomy was made. Under real-time sonographic guidance, an 18 gauge single wall needle was used to puncture the fluid collection. An Amplatz wire was advanced in an inferior direction toward the left inguinal recess. The 18 gauge needle was removed. The skin tract was dilated to 70 [place] and a 61 [place] biliary drain was advanced over the wire and positioned in the anatomic pelvis. Contrast injection confirms that the catheter is located within the peritoneal fluid. The catheter was flushed and connected to JP bulb suction and secured to the skin with 0 Prolene suture. IMPRESSION: 1. Exchange and up size of existing right upper quadrant drainage catheter to a 14 [place] biliary drain. 2. Placement of a new right lower quadrant 14 French all-purpose drainage catheter. 3. Exchange and up size of existing left upper quadrant drainage catheter to a 16 [place] Thal drain. 4. Placement of a new left lower quadrant 14 French biliary drainage catheter. PLAN: 1. Maintain all tubes to JP bulb suction. 2. All tubes should be flushed at least once per shift. 3. Once drain output has diminished or ceased, additional CT imaging of the abdomen and pelvis with intravenous contrast should be obtained prior to drain removal. Signed, Sterling Big, MD, RPVI [facility]  [facility] Radiology Electronically Signed   By: Malachy Moan M.D.   On: 11/09/2018 10:43   Ir Catheter Tube Change  Result Date: 11/09/2018 INDICATION: 63 year old male with florid infectious peritonitis and extensive intraperitoneal abscesses. He was previously treated by placement of bilateral percutaneous drainage catheters on 11/02/2018. Recurrent CT imaging demonstrates ineffective drainage. He presents today for drain exchange, up size and placement of additional drainage catheters. EXAM: 1. Drain injection, exchange and up size of the existing right upper quadrant drainage catheter. 2. Placement of a new right lower quadrant drainage catheter using ultrasound and fluoroscopic guidance. 3. Drain injection, exchange and up size of the existing left upper quadrant drainage catheter. 4. Placement of a new left lower quadrant drainage catheter using ultrasound and fluoroscopic guidance. MEDICATIONS: 2 g Ancef ANESTHESIA/SEDATION: Fentanyl 100 mcg IV; Versed 3 mg IV Moderate Sedation Time:  40 minutes The patient was continuously monitored during the procedure by the interventional radiology nurse under my direct supervision. FLUOROSCOPY TIME:  3 minutes  24 seconds, 10 mGy COMPLICATIONS: None immediate. PROCEDURE: Informed written consent was obtained from the patient after a thorough discussion of the procedural risks, benefits and alternatives. All questions were addressed. Maximal Sterile Barrier Technique was utilized including caps, mask, sterile gowns, sterile gloves, sterile drape, hand hygiene and skin antiseptic. A timeout was performed prior to the initiation of the procedure. Attention was first turned to the existing right upper quadrant drainage catheter which enters the peritoneal cavity from the right mid abdomen. A gentle hand injection of contrast material was performed. There is a persistent abscess cavity around the tip of the catheter. The injected contrast material extends inferiorly  along the right abdominal wall nearly to the skin entry site. The decision was made to proceed with drain exchange and up size. The drainage type also be transition to a biliary drain to facilitate drainage of the entire length of the collection. The retention suture was cut and released. The catheter was transected and removed over an Amplatz wire. The skin tract was dilated to 82 [place] and a 71 [place] biliary drainage catheter was advanced over the wire and formed. Injection through the biliary drain demonstrates its location within the right abdominal fluid collection. The drainage catheter was secured to the skin with 0 Prolene suture and connected to JP bulb suction. Attention was next turned to the right lower quadrant where there was a known fluid collection on the prior CT scan. Ultrasound reveals a complex fluid pocket consistent with peritoneal fluid. A skin entry site was marked. Local anesthesia was attained by infiltration with 1% lidocaine. A small dermatotomy was made. Under real-time sonographic guidance, an 18 gauge single wall needle was advanced into the fluid collection. A 0.018 wire was then advanced through the needle and the needle was removed. The skin tract was dilated to 67 [place] and a Cook 14 [place] percutaneous drain was advanced into the fluid collection and formed. A hand injection of contrast material reveals that the fluid collection extends inferiorly into the right inguinal recess. Therefore, the catheter was manipulated into the more dependent aspect in the right inguinal recess. The catheter was secured to the skin with 0 Prolene suture and connected to JP bulb suction. Attention was next turned to the existing left upper quadrant drain. Again, a hand injection of contrast material was performed. This demonstrates a large persistent abscess cavity. The internal fluid appears quite thick. The decision was made to proceed with up sizing of this drainage catheter to a larger drain  to facilitate drainage. The retention suture was cut. The catheter was transected and removed over an Amplatz wire. The skin tract was dilated to 65 [place] and a 16 [place] Thal drainage catheter was advanced over the wire and formed in the left upper quadrant. The catheter was connected to JP bulb suction and secured to the skin with 0 Prolene suture. This fluid collection is known to extend significantly along the left abdominal wall and into the pelvis. Therefore, ultrasound was again used to evaluate the fluid collection. There is a large fluid collection in the left abdomen. A suitable skin entry site was selected and marked. Local anesthesia was attained by infiltration with 1% lidocaine. A small dermatotomy was made. Under real-time sonographic guidance, an 18 gauge single wall needle was used to puncture the fluid collection. An Amplatz wire was advanced in an inferior direction toward the left inguinal recess. The 18 gauge needle was removed. The skin tract was dilated to 32 [place] and a  14 [place] biliary drain was advanced over the wire and positioned in the anatomic pelvis. Contrast injection confirms that the catheter is located within the peritoneal fluid. The catheter was flushed and connected to JP bulb suction and secured to the skin with 0 Prolene suture. IMPRESSION: 1. Exchange and up size of existing right upper quadrant drainage catheter to a 14 [place] biliary drain. 2. Placement of a new right lower quadrant 14 French all-purpose drainage catheter. 3. Exchange and up size of existing left upper quadrant drainage catheter to a 16 [place] Thal drain. 4. Placement of a new left lower quadrant 14 French biliary drainage catheter. PLAN: 1. Maintain all tubes to JP bulb suction. 2. All tubes should be flushed at least once per shift. 3. Once drain output has diminished or ceased, additional CT imaging of the abdomen and pelvis with intravenous contrast should be obtained prior to drain removal. Signed,  Sterling Big, MD, RPVI [facility] Radiology Electronically Signed   By: Malachy Moan M.D.   On: 11/09/2018 10:43   Ir US Guide Bx Asp/drain  Result Date: 11/09/2018 INDICATION: 63 year old male with florid infectious peritonitis and extensive intraperitoneal abscesses. He was previously treated by placement of bilateral percutaneous drainage catheters on 11/02/2018. Recurrent CT imaging demonstrates ineffective drainage. He presents today for drain exchange, up size and placement of additional drainage catheters. EXAM: 1. Drain injection, exchange and up size of the existing right upper quadrant drainage catheter. 2. Placement of a new right lower quadrant drainage catheter using ultrasound and fluoroscopic guidance. 3. Drain injection, exchange and up size of the existing left upper quadrant drainage catheter. 4. Placement of a new left lower quadrant drainage catheter using ultrasound and fluoroscopic guidance. MEDICATIONS: 2 g Ancef ANESTHESIA/SEDATION: Fentanyl 100 mcg IV; Versed 3 mg IV Moderate Sedation Time:  40 minutes The patient was continuously monitored during the procedure by the interventional radiology nurse under my direct supervision. FLUOROSCOPY TIME:  3 minutes 24 seconds, 10 mGy COMPLICATIONS: None immediate. PROCEDURE: Informed written consent was obtained from the patient after a thorough discussion of the procedural risks, benefits and alternatives. All questions were addressed. Maximal Sterile Barrier Technique was utilized including caps, mask, sterile gowns, sterile gloves, sterile drape, hand hygiene and skin antiseptic. A timeout was performed prior to the initiation of the procedure. Attention was first turned to the existing right upper quadrant drainage catheter which enters the peritoneal cavity from the right mid abdomen. A gentle hand injection of contrast material was performed. There is a persistent abscess cavity  around the tip of the catheter. The injected contrast material extends inferiorly along the right abdominal wall nearly to the skin entry site. The decision was made to proceed with drain exchange and up size. The drainage type also be transition to a biliary drain to facilitate drainage of the entire length of the collection. The retention suture was cut and released. The catheter was transected and removed over an Amplatz wire. The skin tract was dilated to 47 [place] and a 72 [place] biliary drainage catheter was advanced over the wire and formed. Injection through the biliary drain demonstrates its location within the right abdominal fluid collection. The drainage catheter was secured to the skin with 0 Prolene suture and connected to JP bulb suction. Attention was next turned to the right lower quadrant where there was a known fluid collection on the prior CT scan. Ultrasound reveals a complex fluid pocket consistent with peritoneal fluid. A skin entry  site was marked. Local anesthesia was attained by infiltration with 1% lidocaine. A small dermatotomy was made. Under real-time sonographic guidance, an 18 gauge single wall needle was advanced into the fluid collection. A 0.018 wire was then advanced through the needle and the needle was removed. The skin tract was dilated to 65 [place] and a Cook 14 [place] percutaneous drain was advanced into the fluid collection and formed. A hand injection of contrast material reveals that the fluid collection extends inferiorly into the right inguinal recess. Therefore, the catheter was manipulated into the more dependent aspect in the right inguinal recess. The catheter was secured to the skin with 0 Prolene suture and connected to JP bulb suction. Attention was next turned to the existing left upper quadrant drain. Again, a hand injection of contrast material was performed. This demonstrates a large persistent abscess cavity. The internal fluid appears quite thick. The  decision was made to proceed with up sizing of this drainage catheter to a larger drain to facilitate drainage. The retention suture was cut. The catheter was transected and removed over an Amplatz wire. The skin tract was dilated to 31 [place] and a 16 [place] Thal drainage catheter was advanced over the wire and formed in the left upper quadrant. The catheter was connected to JP bulb suction and secured to the skin with 0 Prolene suture. This fluid collection is known to extend significantly along the left abdominal wall and into the pelvis. Therefore, ultrasound was again used to evaluate the fluid collection. There is a large fluid collection in the left abdomen. A suitable skin entry site was selected and marked. Local anesthesia was attained by infiltration with 1% lidocaine. A small dermatotomy was made. Under real-time sonographic guidance, an 18 gauge single wall needle was used to puncture the fluid collection. An Amplatz wire was advanced in an inferior direction toward the left inguinal recess. The 18 gauge needle was removed. The skin tract was dilated to 70 [place] and a 63 [place] biliary drain was advanced over the wire and positioned in the anatomic pelvis. Contrast injection confirms that the catheter is located within the peritoneal fluid. The catheter was flushed and connected to JP bulb suction and secured to the skin with 0 Prolene suture. IMPRESSION: 1. Exchange and up size of existing right upper quadrant drainage catheter to a 14 [place] biliary drain. 2. Placement of a new right lower quadrant 14 French all-purpose drainage catheter. 3. Exchange and up size of existing left upper quadrant drainage catheter to a 16 [place] Thal drain. 4. Placement of a new left lower quadrant 14 French biliary drainage catheter. PLAN: 1. Maintain all tubes to JP bulb suction. 2. All tubes should be flushed at least once per shift. 3. Once drain output has diminished or ceased, additional CT imaging of the abdomen  and pelvis with intravenous contrast should be obtained prior to drain removal. Signed, Sterling Big, MD, RPVI [facility] Radiology Electronically Signed   By: Malachy Moan M.D.   On: 11/09/2018 10:43   Ir US Guide Bx Asp/drain  Result Date: 11/09/2018 INDICATION: 63 year old male with florid infectious peritonitis and extensive intraperitoneal abscesses. He was previously treated by placement of bilateral percutaneous drainage catheters on 11/02/2018. Recurrent CT imaging demonstrates ineffective drainage. He presents today for drain exchange, up size and placement of additional drainage catheters. EXAM: 1. Drain injection, exchange and up size of the existing right upper quadrant drainage catheter. 2. Placement of a new right  lower quadrant drainage catheter using ultrasound and fluoroscopic guidance. 3. Drain injection, exchange and up size of the existing left upper quadrant drainage catheter. 4. Placement of a new left lower quadrant drainage catheter using ultrasound and fluoroscopic guidance. MEDICATIONS: 2 g Ancef ANESTHESIA/SEDATION: Fentanyl 100 mcg IV; Versed 3 mg IV Moderate Sedation Time:  40 minutes The patient was continuously monitored during the procedure by the interventional radiology nurse under my direct supervision. FLUOROSCOPY TIME:  3 minutes 24 seconds, 10 mGy COMPLICATIONS: None immediate. PROCEDURE: Informed written consent was obtained from the patient after a thorough discussion of the procedural risks, benefits and alternatives. All questions were addressed. Maximal Sterile Barrier Technique was utilized including caps, mask, sterile gowns, sterile gloves, sterile drape, hand hygiene and skin antiseptic. A timeout was performed prior to the initiation of the procedure. Attention was first turned to the existing right upper quadrant drainage catheter which enters the peritoneal cavity from the right mid abdomen. A gentle  hand injection of contrast material was performed. There is a persistent abscess cavity around the tip of the catheter. The injected contrast material extends inferiorly along the right abdominal wall nearly to the skin entry site. The decision was made to proceed with drain exchange and up size. The drainage type also be transition to a biliary drain to facilitate drainage of the entire length of the collection. The retention suture was cut and released. The catheter was transected and removed over an Amplatz wire. The skin tract was dilated to 73 [place] and a 55 [place] biliary drainage catheter was advanced over the wire and formed. Injection through the biliary drain demonstrates its location within the right abdominal fluid collection. The drainage catheter was secured to the skin with 0 Prolene suture and connected to JP bulb suction. Attention was next turned to the right lower quadrant where there was a known fluid collection on the prior CT scan. Ultrasound reveals a complex fluid pocket consistent with peritoneal fluid. A skin entry site was marked. Local anesthesia was attained by infiltration with 1% lidocaine. A small dermatotomy was made. Under real-time sonographic guidance, an 18 gauge single wall needle was advanced into the fluid collection. A 0.018 wire was then advanced through the needle and the needle was removed. The skin tract was dilated to 27 [place] and a [place] 14 [place] percutaneous drain was advanced into the fluid collection and formed. A hand injection of contrast material reveals that the fluid collection extends inferiorly into the right inguinal recess. Therefore, the catheter was manipulated into the more dependent aspect in the right inguinal recess. The catheter was secured to the skin with 0 Prolene suture and connected to JP bulb suction. Attention was next turned to the existing left upper quadrant drain. Again, a hand injection of contrast material was performed. This  demonstrates a large persistent abscess cavity. The internal fluid appears quite thick. The decision was made to proceed with up sizing of this drainage catheter to a larger drain to facilitate drainage. The retention suture was cut. The catheter was transected and removed over an Amplatz wire. The skin tract was dilated to 47 [place] and a 16 [place] Thal drainage catheter was advanced over the wire and formed in the left upper quadrant. The catheter was connected to JP bulb suction and secured to the skin with 0 Prolene suture. This fluid collection is known to extend significantly along the left abdominal wall and into the pelvis. Therefore, ultrasound was again used to evaluate the fluid collection.  There is a large fluid collection in the left abdomen. A suitable skin entry site was selected and marked. Local anesthesia was attained by infiltration with 1% lidocaine. A small dermatotomy was made. Under real-time sonographic guidance, an 18 gauge single wall needle was used to puncture the fluid collection. An Amplatz wire was advanced in an inferior direction toward the left inguinal recess. The 18 gauge needle was removed. The skin tract was dilated to 2014 [place] and a 4914 [place] biliary drain was advanced over the wire and positioned in the anatomic pelvis. Contrast injection confirms that the catheter is located within the peritoneal fluid. The catheter was flushed and connected to JP bulb suction and secured to the skin with 0 Prolene suture. IMPRESSION: 1. Exchange and up size of existing right upper quadrant drainage catheter to a 14 [place] biliary drain. 2. Placement of a new right lower quadrant 14 French all-purpose drainage catheter. 3. Exchange and up size of existing left upper quadrant drainage catheter to a 16 [place] Thal drain. 4. Placement of a new left lower quadrant 14 French biliary drainage catheter. PLAN: 1. Maintain all tubes to JP bulb suction. 2. All tubes should be flushed at least once per  shift. 3. Once drain output has diminished or ceased, additional CT imaging of the abdomen and pelvis with intravenous contrast should be obtained prior to drain removal. Signed, Sterling BigHeath K. McCullough, MD, RPVI [facility] Electronically Signed   By: Malachy MoanHeath  McCullough M.D.   On: 11/09/2018 10:43        Scheduled Meds: . heparin injection (subcutaneous)  5,000 Units Subcutaneous Q8H  . pantoprazole (PROTONIX) IV  40 mg Intravenous Q12H  . sodium chloride flush  10-40 mL Intracatheter Q12H  . sodium chloride flush  5 mL Intracatheter Q8H  . sodium chloride flush  5 mL Intracatheter Q8H   Continuous Infusions: . sodium chloride 50 mL/hr at 11/11/18 0600  . sodium chloride 10 mL/hr at 11/11/18 0600  . piperacillin-tazobactam (ZOSYN)  IV 3.375 g (11/11/18 0505)  . TPN ADULT (ION) 80 mL/hr at 11/11/18 0600     LOS: 17 days     Alwyn RenElizabeth G , MD [facility]  If 7PM-7AM, please contact night-coverage [email or web address] Password [email or web address] 11/11/2018, 9:27 AM

## 2018-11-11 NOTE — Progress Notes (Signed)
PHARMACY - ADULT TOTAL PARENTERAL NUTRITION CONSULT NOTE   Pharmacy Consult for TPN Indication: bowel rest, severe malnutrition PTA  Patient Measurements: Height: 6' 2"  (188 cm) Weight: 125 lb 14.1 oz (57.1 kg) IBW/kg (Calculated) : 82.2 TPN AdjBW (KG): 54 Body mass index is 16.16 kg/m. Usual Weight: unknown as patient is poor historian, but reports significant weight loss over the past year  HPI: 49 yoM with PMH PUD with perforated gastric ulcer s/p repair in 2003, GERD presents with worsening chronic abdominal pain and associated weight loss. Cachectic appearing. CT reveals ascites with concern for SBO vs recurrent ulcer disease. Pt is NPO. Consulted by surgery for TPN management. Pt will require nutritional support prior to any surgical intervention.  Current Nutrition: NPO/chips IVF: NS at 50 mL/hr  Central access: Double lumen PICC placed 2/14 TPN start date: 2/14  Significant events:  2/27 KUB w/ persistent free air, CT scan w/ persistent abd fluid collection plus 2 additional fluid collections RLQ anda deep central pelvis 2/28 to IR for drain exchanges/upsize x 2, place 2 more drains if possible  ASSESSMENT                                                                                               Today, 11/11/2018:  Glucose (goal 100-150): no longer checking CBGs d/t excellent control; SBGs stable within goal range  Electrolytes - stable as of 2/27  Renal - SCr, bicarb, BUN, UOP stable WNL as of 2/27  LFTs, Alk Phos continues to rise slowly; still less than 2x ULN; other enzymes stable; albumin remains low. Doubt alk phos is acutely elevated d/t TPN  TGs - WNL on 2/24  Prealbumin - still moderately low, coming up on TPN  NUTRITIONAL GOALS                                                                                 RD recs (per note on 2/14): Kcal: 1900 - 2100 grams Protein: 95 - 115 g Fluid: >/= 1.9 L/day  PLAN                                                                                                                At 1800 today:  Continue TPN at goal rate of 80 mL/hr providing 100% of patient needs with 1978 kcal and 106  g protein  Electrolytes: Mag slightly increased; Cl:Ac = 1:1; no changes today  No SSI needed  Continue IVF as ordered  TPN lab panels on Mondays & Thursdays  Reuel Boom, PharmD, BCPS [phone] 11/11/2018, 10:49 AM

## 2018-11-12 LAB — DIFFERENTIAL
Abs Immature Granulocytes: 0.1 10*3/uL — ABNORMAL HIGH (ref 0.00–0.07)
Basophils Absolute: 0.1 10*3/uL (ref 0.0–0.1)
Basophils Relative: 1 %
Eosinophils Absolute: 0.2 10*3/uL (ref 0.0–0.5)
Eosinophils Relative: 2 %
Immature Granulocytes: 1 %
LYMPHS PCT: 15 %
Lymphs Abs: 1.5 10*3/uL (ref 0.7–4.0)
Monocytes Absolute: 0.6 10*3/uL (ref 0.1–1.0)
Monocytes Relative: 6 %
Neutro Abs: 7.6 10*3/uL (ref 1.7–7.7)
Neutrophils Relative %: 75 %

## 2018-11-12 LAB — PREALBUMIN: Prealbumin: 17.8 mg/dL — ABNORMAL LOW (ref 18–38)

## 2018-11-12 LAB — CBC
HEMATOCRIT: 30 % — AB (ref 39.0–52.0)
HEMOGLOBIN: 9.6 g/dL — AB (ref 13.0–17.0)
MCH: 33.1 pg (ref 26.0–34.0)
MCHC: 32 g/dL (ref 30.0–36.0)
MCV: 103.4 fL — ABNORMAL HIGH (ref 80.0–100.0)
Platelets: 438 10*3/uL — ABNORMAL HIGH (ref 150–400)
RBC: 2.9 MIL/uL — ABNORMAL LOW (ref 4.22–5.81)
RDW: 14.6 % (ref 11.5–15.5)
WBC: 10.1 10*3/uL (ref 4.0–10.5)
nRBC: 0 % (ref 0.0–0.2)

## 2018-11-12 LAB — PHOSPHORUS: Phosphorus: 3.9 mg/dL (ref 2.5–4.6)

## 2018-11-12 LAB — COMPREHENSIVE METABOLIC PANEL
ALT: 23 U/L (ref 0–44)
AST: 28 U/L (ref 15–41)
Albumin: 2 g/dL — ABNORMAL LOW (ref 3.5–5.0)
Alkaline Phosphatase: 215 U/L — ABNORMAL HIGH (ref 38–126)
Anion gap: 6 (ref 5–15)
BUN: 20 mg/dL (ref 8–23)
CO2: 26 mmol/L (ref 22–32)
Calcium: 8.6 mg/dL — ABNORMAL LOW (ref 8.9–10.3)
Chloride: 103 mmol/L (ref 98–111)
Creatinine, Ser: 0.7 mg/dL (ref 0.61–1.24)
GFR calc Af Amer: >60 mL/min (ref >60)
GFR calc non Af Amer: >60 mL/min (ref >60)
Glucose, Bld: 101 mg/dL — ABNORMAL HIGH (ref 70–99)
Potassium: 4 mmol/L (ref 3.5–5.1)
Sodium: 135 mmol/L (ref 135–145)
Total Bilirubin: 0.3 mg/dL (ref 0.3–1.2)
Total Protein: 7.1 g/dL (ref 6.5–8.1)

## 2018-11-12 LAB — TRIGLYCERIDES: Triglycerides: 92 mg/dL (ref <150)

## 2018-11-12 LAB — MAGNESIUM: Magnesium: 1.9 mg/dL (ref 1.7–2.4)

## 2018-11-12 MED ORDER — TRAVASOL 10 % IV SOLN
INTRAVENOUS | Status: AC
  Administered 2018-11-12: 19:00:00 via INTRAVENOUS
  Filled 2018-11-12: qty 1056

## 2018-11-12 NOTE — Progress Notes (Signed)
Referring Physician(s): Gonfa, T.  Supervising Physician: Malachy Moan  Patient Status:  [facility] - In-pt  Chief Complaint: S/P exchange and upsize of bilateral flank drains; placement of two new 67F biliary drains on 2/28  Subjective: Patient states he is doing well. He was experiencing some mild pain at the drain sites for the first day after the exchange/addition on 2/28, but since the pain has improved greatly. Patient denies nausea, vomiting, abdominal pain, back pain, dyspnea, and fever.  Allergies: Patient has no known allergies.  Medications: Prior to Admission medications   Medication Sig Start Date End Date Taking? Authorizing Provider  Alum & Mag Hydroxide-Simeth (GI COCKTAIL) SUSP suspension Take 30 mLs by mouth 2 (two) times daily. Shake well. 10/10/18  Yes Gwyneth Sprout, MD  omeprazole (PRILOSEC) 20 MG capsule Take 1 capsule (20 mg total) by mouth daily. 10/10/18  Yes Gwyneth Sprout, MD     Vital Signs: BP 125/63 (BP Location: Left Arm)   Pulse 84   Temp 98.2 F (36.8 C) (Oral)   Resp 18   Ht 6\' 2"  (1.88 m)   Wt 125 lb 14.1 oz (57.1 kg)   SpO2 100%   BMI 16.16 kg/m   Physical Exam  Patient is awake and alert, appears cachectic, and is resting comfortably in bed. Abdomen is soft and non-tender to palpation. Abdominal drain sites are clean and dry, with no erythema or warmth to palpation. Purulent discharge is draining into 4 JP bulbs.   The four abdominal drains had 210 cc of output in last 24 hours.  Imaging: Ir Catheter Tube Change  Result Date: 11/09/2018 INDICATION: 63 year old male with florid infectious peritonitis and extensive intraperitoneal abscesses. He was previously treated by placement of bilateral percutaneous drainage catheters on 11/02/2018. Recurrent CT imaging demonstrates ineffective drainage. He presents today for drain exchange, up size and placement of additional drainage catheters. EXAM: 1. Drain injection, exchange and up  size of the existing right upper quadrant drainage catheter. 2. Placement of a new right lower quadrant drainage catheter using ultrasound and fluoroscopic guidance. 3. Drain injection, exchange and up size of the existing left upper quadrant drainage catheter. 4. Placement of a new left lower quadrant drainage catheter using ultrasound and fluoroscopic guidance. MEDICATIONS: 2 g Ancef ANESTHESIA/SEDATION: Fentanyl 100 mcg IV; Versed 3 mg IV Moderate Sedation Time:  40 minutes The patient was continuously monitored during the procedure by the interventional radiology nurse under my direct supervision. FLUOROSCOPY TIME:  3 minutes 24 seconds, 10 mGy COMPLICATIONS: None immediate. PROCEDURE: Informed written consent was obtained from the patient after a thorough discussion of the procedural risks, benefits and alternatives. All questions were addressed. Maximal Sterile Barrier Technique was utilized including caps, mask, sterile gowns, sterile gloves, sterile drape, hand hygiene and skin antiseptic. A timeout was performed prior to the initiation of the procedure. Attention was first turned to the existing right upper quadrant drainage catheter which enters the peritoneal cavity from the right mid abdomen. A gentle hand injection of contrast material was performed. There is a persistent abscess cavity around the tip of the catheter. The injected contrast material extends inferiorly along the right abdominal wall nearly to the skin entry site. The decision was made to proceed with drain exchange and up size. The drainage type also be transition to a biliary drain to facilitate drainage of the entire length of the collection. The retention suture was cut and released. The catheter was transected and removed over an Amplatz wire. The skin tract  was dilated to 54 [place] and a 40 [place] biliary drainage catheter was advanced over the wire and formed. Injection through the biliary drain demonstrates its location within the  right abdominal fluid collection. The drainage catheter was secured to the skin with 0 Prolene suture and connected to JP bulb suction. Attention was next turned to the right lower quadrant where there was a known fluid collection on the prior CT scan. Ultrasound reveals a complex fluid pocket consistent with peritoneal fluid. A skin entry site was marked. Local anesthesia was attained by infiltration with 1% lidocaine. A small dermatotomy was made. Under real-time sonographic guidance, an 18 gauge single wall needle was advanced into the fluid collection. A 0.018 wire was then advanced through the needle and the needle was removed. The skin tract was dilated to 42 [place] and a Cook 14 [place] percutaneous drain was advanced into the fluid collection and formed. A hand injection of contrast material reveals that the fluid collection extends inferiorly into the right inguinal recess. Therefore, the catheter was manipulated into the more dependent aspect in the right inguinal recess. The catheter was secured to the skin with 0 Prolene suture and connected to JP bulb suction. Attention was next turned to the existing left upper quadrant drain. Again, a hand injection of contrast material was performed. This demonstrates a large persistent abscess cavity. The internal fluid appears quite thick. The decision was made to proceed with up sizing of this drainage catheter to a larger drain to facilitate drainage. The retention suture was cut. The catheter was transected and removed over an Amplatz wire. The skin tract was dilated to 51 [place] and a 16 [place] Thal drainage catheter was advanced over the wire and formed in the left upper quadrant. The catheter was connected to JP bulb suction and secured to the skin with 0 Prolene suture. This fluid collection is known to extend significantly along the left abdominal wall and into the pelvis. Therefore, ultrasound was again used to evaluate the fluid collection. There is a  large fluid collection in the left abdomen. A suitable skin entry site was selected and marked. Local anesthesia was attained by infiltration with 1% lidocaine. A small dermatotomy was made. Under real-time sonographic guidance, an 18 gauge single wall needle was used to puncture the fluid collection. An Amplatz wire was advanced in an inferior direction toward the left inguinal recess. The 18 gauge needle was removed. The skin tract was dilated to 79 [place] and a 77 [place] biliary drain was advanced over the wire and positioned in the anatomic pelvis. Contrast injection confirms that the catheter is located within the peritoneal fluid. The catheter was flushed and connected to JP bulb suction and secured to the skin with 0 Prolene suture. IMPRESSION: 1. Exchange and up size of existing right upper quadrant drainage catheter to a 14 [place] biliary drain. 2. Placement of a new right lower quadrant 14 French all-purpose drainage catheter. 3. Exchange and up size of existing left upper quadrant drainage catheter to a 16 [place] Thal drain. 4. Placement of a new left lower quadrant 14 French biliary drainage catheter. PLAN: 1. Maintain all tubes to JP bulb suction. 2. All tubes should be flushed at least once per shift. 3. Once drain output has diminished or ceased, additional CT imaging of the abdomen and pelvis with intravenous contrast should be obtained prior to drain removal. Signed, Sterling Big, MD, RPVI [facility] Radiology Electronically Signed   By: Vilma Prader  Archer Asa M.D.   On: 11/09/2018 10:43   Ir Catheter Tube Change  Result Date: 11/09/2018 INDICATION: 63 year old male with florid infectious peritonitis and extensive intraperitoneal abscesses. He was previously treated by placement of bilateral percutaneous drainage catheters on 11/02/2018. Recurrent CT imaging demonstrates ineffective drainage. He presents today for drain exchange, up size and  placement of additional drainage catheters. EXAM: 1. Drain injection, exchange and up size of the existing right upper quadrant drainage catheter. 2. Placement of a new right lower quadrant drainage catheter using ultrasound and fluoroscopic guidance. 3. Drain injection, exchange and up size of the existing left upper quadrant drainage catheter. 4. Placement of a new left lower quadrant drainage catheter using ultrasound and fluoroscopic guidance. MEDICATIONS: 2 g Ancef ANESTHESIA/SEDATION: Fentanyl 100 mcg IV; Versed 3 mg IV Moderate Sedation Time:  40 minutes The patient was continuously monitored during the procedure by the interventional radiology nurse under my direct supervision. FLUOROSCOPY TIME:  3 minutes 24 seconds, 10 mGy COMPLICATIONS: None immediate. PROCEDURE: Informed written consent was obtained from the patient after a thorough discussion of the procedural risks, benefits and alternatives. All questions were addressed. Maximal Sterile Barrier Technique was utilized including caps, mask, sterile gowns, sterile gloves, sterile drape, hand hygiene and skin antiseptic. A timeout was performed prior to the initiation of the procedure. Attention was first turned to the existing right upper quadrant drainage catheter which enters the peritoneal cavity from the right mid abdomen. A gentle hand injection of contrast material was performed. There is a persistent abscess cavity around the tip of the catheter. The injected contrast material extends inferiorly along the right abdominal wall nearly to the skin entry site. The decision was made to proceed with drain exchange and up size. The drainage type also be transition to a biliary drain to facilitate drainage of the entire length of the collection. The retention suture was cut and released. The catheter was transected and removed over an Amplatz wire. The skin tract was dilated to 45 [place] and a 59 [place] biliary drainage catheter was advanced over the wire  and formed. Injection through the biliary drain demonstrates its location within the right abdominal fluid collection. The drainage catheter was secured to the skin with 0 Prolene suture and connected to JP bulb suction. Attention was next turned to the right lower quadrant where there was a known fluid collection on the prior CT scan. Ultrasound reveals a complex fluid pocket consistent with peritoneal fluid. A skin entry site was marked. Local anesthesia was attained by infiltration with 1% lidocaine. A small dermatotomy was made. Under real-time sonographic guidance, an 18 gauge single wall needle was advanced into the fluid collection. A 0.018 wire was then advanced through the needle and the needle was removed. The skin tract was dilated to 20 [place] and a Cook 14 [place] percutaneous drain was advanced into the fluid collection and formed. A hand injection of contrast material reveals that the fluid collection extends inferiorly into the right inguinal recess. Therefore, the catheter was manipulated into the more dependent aspect in the right inguinal recess. The catheter was secured to the skin with 0 Prolene suture and connected to JP bulb suction. Attention was next turned to the existing left upper quadrant drain. Again, a hand injection of contrast material was performed. This demonstrates a large persistent abscess cavity. The internal fluid appears quite thick. The decision was made to proceed with up sizing of this drainage catheter to a larger drain to facilitate drainage. The retention suture  was cut. The catheter was transected and removed over an Amplatz wire. The skin tract was dilated to 5116 [place] and a 16 [place] Thal drainage catheter was advanced over the wire and formed in the left upper quadrant. The catheter was connected to JP bulb suction and secured to the skin with 0 Prolene suture. This fluid collection is known to extend significantly along the left abdominal wall and into the pelvis.  Therefore, ultrasound was again used to evaluate the fluid collection. There is a large fluid collection in the left abdomen. A suitable skin entry site was selected and marked. Local anesthesia was attained by infiltration with 1% lidocaine. A small dermatotomy was made. Under real-time sonographic guidance, an 18 gauge single wall needle was used to puncture the fluid collection. An Amplatz wire was advanced in an inferior direction toward the left inguinal recess. The 18 gauge needle was removed. The skin tract was dilated to 8914 [place] and a 4014 [place] biliary drain was advanced over the wire and positioned in the anatomic pelvis. Contrast injection confirms that the catheter is located within the peritoneal fluid. The catheter was flushed and connected to JP bulb suction and secured to the skin with 0 Prolene suture. IMPRESSION: 1. Exchange and up size of existing right upper quadrant drainage catheter to a 14 [place] biliary drain. 2. Placement of a new right lower quadrant 14 French all-purpose drainage catheter. 3. Exchange and up size of existing left upper quadrant drainage catheter to a 16 [place] Thal drain. 4. Placement of a new left lower quadrant 14 French biliary drainage catheter. PLAN: 1. Maintain all tubes to JP bulb suction. 2. All tubes should be flushed at least once per shift. 3. Once drain output has diminished or ceased, additional CT imaging of the abdomen and pelvis with intravenous contrast should be obtained prior to drain removal. Signed, Sterling BigHeath K. McCullough, MD, RPVI [facility] Electronically Signed   By: Malachy MoanHeath  McCullough M.D.   On: 11/09/2018 10:43   Ir Koreas Guide Bx Asp/drain  Result Date: 11/09/2018 INDICATION: 63 year old male with florid infectious peritonitis and extensive intraperitoneal abscesses. He was previously treated by placement of bilateral percutaneous drainage catheters on 11/02/2018. Recurrent CT imaging  demonstrates ineffective drainage. He presents today for drain exchange, up size and placement of additional drainage catheters. EXAM: 1. Drain injection, exchange and up size of the existing right upper quadrant drainage catheter. 2. Placement of a new right lower quadrant drainage catheter using ultrasound and fluoroscopic guidance. 3. Drain injection, exchange and up size of the existing left upper quadrant drainage catheter. 4. Placement of a new left lower quadrant drainage catheter using ultrasound and fluoroscopic guidance. MEDICATIONS: 2 g Ancef ANESTHESIA/SEDATION: Fentanyl 100 mcg IV; Versed 3 mg IV Moderate Sedation Time:  40 minutes The patient was continuously monitored during the procedure by the interventional radiology nurse under my direct supervision. FLUOROSCOPY TIME:  3 minutes 24 seconds, 10 mGy COMPLICATIONS: None immediate. PROCEDURE: Informed written consent was obtained from the patient after a thorough discussion of the procedural risks, benefits and alternatives. All questions were addressed. Maximal Sterile Barrier Technique was utilized including caps, mask, sterile gowns, sterile gloves, sterile drape, hand hygiene and skin antiseptic. A timeout was performed prior to the initiation of the procedure. Attention was first turned to the existing right upper quadrant drainage catheter which enters the peritoneal cavity from the right mid abdomen. A gentle hand injection of contrast material was performed. There is a  persistent abscess cavity around the tip of the catheter. The injected contrast material extends inferiorly along the right abdominal wall nearly to the skin entry site. The decision was made to proceed with drain exchange and up size. The drainage type also be transition to a biliary drain to facilitate drainage of the entire length of the collection. The retention suture was cut and released. The catheter was transected and removed over an Amplatz wire. The skin tract was  dilated to 12 [place] and a 88 [place] biliary drainage catheter was advanced over the wire and formed. Injection through the biliary drain demonstrates its location within the right abdominal fluid collection. The drainage catheter was secured to the skin with 0 Prolene suture and connected to JP bulb suction. Attention was next turned to the right lower quadrant where there was a known fluid collection on the prior CT scan. Ultrasound reveals a complex fluid pocket consistent with peritoneal fluid. A skin entry site was marked. Local anesthesia was attained by infiltration with 1% lidocaine. A small dermatotomy was made. Under real-time sonographic guidance, an 18 gauge single wall needle was advanced into the fluid collection. A 0.018 wire was then advanced through the needle and the needle was removed. The skin tract was dilated to 2 [place] and a Cook 14 [place] percutaneous drain was advanced into the fluid collection and formed. A hand injection of contrast material reveals that the fluid collection extends inferiorly into the right inguinal recess. Therefore, the catheter was manipulated into the more dependent aspect in the right inguinal recess. The catheter was secured to the skin with 0 Prolene suture and connected to JP bulb suction. Attention was next turned to the existing left upper quadrant drain. Again, a hand injection of contrast material was performed. This demonstrates a large persistent abscess cavity. The internal fluid appears quite thick. The decision was made to proceed with up sizing of this drainage catheter to a larger drain to facilitate drainage. The retention suture was cut. The catheter was transected and removed over an Amplatz wire. The skin tract was dilated to 20 [place] and a 16 [place] Thal drainage catheter was advanced over the wire and formed in the left upper quadrant. The catheter was connected to JP bulb suction and secured to the skin with 0 Prolene suture. This fluid  collection is known to extend significantly along the left abdominal wall and into the pelvis. Therefore, ultrasound was again used to evaluate the fluid collection. There is a large fluid collection in the left abdomen. A suitable skin entry site was selected and marked. Local anesthesia was attained by infiltration with 1% lidocaine. A small dermatotomy was made. Under real-time sonographic guidance, an 18 gauge single wall needle was used to puncture the fluid collection. An Amplatz wire was advanced in an inferior direction toward the left inguinal recess. The 18 gauge needle was removed. The skin tract was dilated to 3 [place] and a 5 [place] biliary drain was advanced over the wire and positioned in the anatomic pelvis. Contrast injection confirms that the catheter is located within the peritoneal fluid. The catheter was flushed and connected to JP bulb suction and secured to the skin with 0 Prolene suture. IMPRESSION: 1. Exchange and up size of existing right upper quadrant drainage catheter to a 14 [place] biliary drain. 2. Placement of a new right lower quadrant 14 French all-purpose drainage catheter. 3. Exchange and up size of existing left upper quadrant drainage catheter to a 16 [place] Thal drain.  4. Placement of a new left lower quadrant 14 French biliary drainage catheter. PLAN: 1. Maintain all tubes to JP bulb suction. 2. All tubes should be flushed at least once per shift. 3. Once drain output has diminished or ceased, additional CT imaging of the abdomen and pelvis with intravenous contrast should be obtained prior to drain removal. Signed, Sterling Big, MD, RPVI [facility] Radiology Electronically Signed   By: Malachy Moan M.D.   On: 11/09/2018 10:43   Ir US Guide Bx Asp/drain  Result Date: 11/09/2018 INDICATION: 63 year old male with florid infectious peritonitis and extensive intraperitoneal abscesses. He was previously treated by  placement of bilateral percutaneous drainage catheters on 11/02/2018. Recurrent CT imaging demonstrates ineffective drainage. He presents today for drain exchange, up size and placement of additional drainage catheters. EXAM: 1. Drain injection, exchange and up size of the existing right upper quadrant drainage catheter. 2. Placement of a new right lower quadrant drainage catheter using ultrasound and fluoroscopic guidance. 3. Drain injection, exchange and up size of the existing left upper quadrant drainage catheter. 4. Placement of a new left lower quadrant drainage catheter using ultrasound and fluoroscopic guidance. MEDICATIONS: 2 g Ancef ANESTHESIA/SEDATION: Fentanyl 100 mcg IV; Versed 3 mg IV Moderate Sedation Time:  40 minutes The patient was continuously monitored during the procedure by the interventional radiology nurse under my direct supervision. FLUOROSCOPY TIME:  3 minutes 24 seconds, 10 mGy COMPLICATIONS: None immediate. PROCEDURE: Informed written consent was obtained from the patient after a thorough discussion of the procedural risks, benefits and alternatives. All questions were addressed. Maximal Sterile Barrier Technique was utilized including caps, mask, sterile gowns, sterile gloves, sterile drape, hand hygiene and skin antiseptic. A timeout was performed prior to the initiation of the procedure. Attention was first turned to the existing right upper quadrant drainage catheter which enters the peritoneal cavity from the right mid abdomen. A gentle hand injection of contrast material was performed. There is a persistent abscess cavity around the tip of the catheter. The injected contrast material extends inferiorly along the right abdominal wall nearly to the skin entry site. The decision was made to proceed with drain exchange and up size. The drainage type also be transition to a biliary drain to facilitate drainage of the entire length of the collection. The retention suture was cut and  released. The catheter was transected and removed over an Amplatz wire. The skin tract was dilated to 76 [place] and a 41 [place] biliary drainage catheter was advanced over the wire and formed. Injection through the biliary drain demonstrates its location within the right abdominal fluid collection. The drainage catheter was secured to the skin with 0 Prolene suture and connected to JP bulb suction. Attention was next turned to the right lower quadrant where there was a known fluid collection on the prior CT scan. Ultrasound reveals a complex fluid pocket consistent with peritoneal fluid. A skin entry site was marked. Local anesthesia was attained by infiltration with 1% lidocaine. A small dermatotomy was made. Under real-time sonographic guidance, an 18 gauge single wall needle was advanced into the fluid collection. A 0.018 wire was then advanced through the needle and the needle was removed. The skin tract was dilated to 75 [place] and a Cook 14 [place] percutaneous drain was advanced into the fluid collection and formed. A hand injection of contrast material reveals that the fluid collection extends inferiorly into the right inguinal recess. Therefore, the catheter was manipulated into the more  dependent aspect in the right inguinal recess. The catheter was secured to the skin with 0 Prolene suture and connected to JP bulb suction. Attention was next turned to the existing left upper quadrant drain. Again, a hand injection of contrast material was performed. This demonstrates a large persistent abscess cavity. The internal fluid appears quite thick. The decision was made to proceed with up sizing of this drainage catheter to a larger drain to facilitate drainage. The retention suture was cut. The catheter was transected and removed over an Amplatz wire. The skin tract was dilated to 61 [place] and a 16 [place] Thal drainage catheter was advanced over the wire and formed in the left upper quadrant. The catheter was  connected to JP bulb suction and secured to the skin with 0 Prolene suture. This fluid collection is known to extend significantly along the left abdominal wall and into the pelvis. Therefore, ultrasound was again used to evaluate the fluid collection. There is a large fluid collection in the left abdomen. A suitable skin entry site was selected and marked. Local anesthesia was attained by infiltration with 1% lidocaine. A small dermatotomy was made. Under real-time sonographic guidance, an 18 gauge single wall needle was used to puncture the fluid collection. An Amplatz wire was advanced in an inferior direction toward the left inguinal recess. The 18 gauge needle was removed. The skin tract was dilated to 26 [place] and a 58 [place] biliary drain was advanced over the wire and positioned in the anatomic pelvis. Contrast injection confirms that the catheter is located within the peritoneal fluid. The catheter was flushed and connected to JP bulb suction and secured to the skin with 0 Prolene suture. IMPRESSION: 1. Exchange and up size of existing right upper quadrant drainage catheter to a 14 [place] biliary drain. 2. Placement of a new right lower quadrant 14 French all-purpose drainage catheter. 3. Exchange and up size of existing left upper quadrant drainage catheter to a 16 [place] Thal drain. 4. Placement of a new left lower quadrant 14 French biliary drainage catheter. PLAN: 1. Maintain all tubes to JP bulb suction. 2. All tubes should be flushed at least once per shift. 3. Once drain output has diminished or ceased, additional CT imaging of the abdomen and pelvis with intravenous contrast should be obtained prior to drain removal. Signed, Sterling Big, MD, RPVI [facility] Radiology Electronically Signed   By: Malachy Moan M.D.   On: 11/09/2018 10:43    Labs:  CBC: Recent Labs    11/04/18 0806 11/05/18 0429 11/07/18 0520 11/12/18 0416  WBC  4.5 5.1 6.8 10.1  HGB 9.4* 9.1* 9.6* 9.6*  HCT 28.9* 28.2* 29.2* 30.0*  PLT 303 280 308 438*    COAGS: Recent Labs    10/26/18 0500 11/08/18 1641  INR 1.13 1.0    BMP: Recent Labs    11/05/18 0429 11/07/18 0520 11/08/18 0349 11/12/18 0416  NA 137 135 136 135  K 3.8 3.8 4.1 4.0  CL 104 102 102 103  CO2 GLUCOSE 114* 114* 108* 101*  BUN CALCIUM 8.0* 8.3* 8.5* 8.6*  CREATININE 0.73 0.72 0.73 0.70  GFRNONAA >60 >60 >60 >60  GFRAA >60 >60 >60 >60    LIVER FUNCTION TESTS: Recent Labs    11/05/18 0429 11/08/18 0349 11/11/18 0519 11/12/18 0416  BILITOT 0.3 0.3 0.3 0.3  AST ALT 23 25 23  23  ALKPHOS 152* 178* 206* 215*  PROT 6.4* 7.3 7.2 7.1  ALBUMIN 1.7* 2.0* 1.9* 2.0*    Assessment and Plan: Small bowel obstruction; Intra-abdominal abscesses; severe protein calorie malnutrition; labs are stable; Cx - erysipelothrix rhusiopathiae Continue to monitor output of drains and flush TID with saline; continue NG tube and bowel rest; keep patient NPO and feed via TPN; keep drain sites clean and dry  Electronically Signed: D. Jeananne Rama, PA-C/Maxwell Dennie Bible, PA-S 11/12/2018, 10:53 AM   I spent a total of 15 Minutes at the the patient's bedside AND on the patient's hospital floor or unit, greater than 50% of which was counseling/coordinating care for  abdominal abscess flank drains   Patient ID: [name], male   DOB: 1956-02-02, 63 y.o.   MRN: [id number]

## 2018-11-12 NOTE — Progress Notes (Signed)
Case discussed with Dr. Dwain Sarna.  Patient needs more medical management, hyperalimentation, prior to consideration of surgery.  [facility] will revisit patient next week.

## 2018-11-12 NOTE — Progress Notes (Signed)
PHARMACY - ADULT TOTAL PARENTERAL NUTRITION CONSULT NOTE   Pharmacy Consult for TPN Indication: bowel rest, severe malnutrition PTA  Patient Measurements: Height: 6' 2"  (188 cm) Weight: 125 lb 14.1 oz (57.1 kg) IBW/kg (Calculated) : 82.2 TPN AdjBW (KG): 54 Body mass index is 16.16 kg/m. Usual Weight: unknown as patient is poor historian, but reports significant weight loss over the past year  HPI: 70 yoM with PMH PUD with perforated gastric ulcer s/p repair in 2003, GERD presents with worsening chronic abdominal pain and associated weight loss. Cachectic appearing. CT reveals ascites with concern for SBO vs recurrent ulcer disease. Pt is NPO. Consulted by surgery for TPN management. Pt will require nutritional support prior to any surgical intervention.  Current Nutrition: NPO/chips IVF: NS at 50 mL/hr  Central access: Double lumen PICC placed 2/14 TPN start date: 2/14  Significant events:  2/27 KUB w/ persistent free air, CT scan w/ persistent abd fluid collection plus 2 additional fluid collections RLQ anda deep central pelvis 2/28 to IR for drain exchanges/upsize x 2, place 2 more drains if possible  ASSESSMENT                                                                                               Today, 11/12/2018:  Glucose (goal 100-150): no longer checking CBGs d/t excellent control;CBGs stable within goal range  Electrolytes - stable WNL  Renal - SCr, bicarb, BUN, UOP 1.8L last 24hr - less than previous 24  LFTs, Alk Phos continues to rise slowly; still less than 2x ULN; other enzymes stable; albumin remains low. Doubt alk phos is acutely elevated d/t TPN  TGs - WNL on 2/24 and 3/2  Prealbumin - 17.8 (3/2) improving  NUTRITIONAL GOALS                                                                                 RD recs (per note on 2/14): Kcal: 1900 - 2100 grams Protein: 95 - 115 g Fluid: >/= 1.9 L/day  PLAN                                                                                                                At 1800 today:  Continue TPN at goal rate of 80 mL/hr providing 100% of patient needs with 1978 kcal and 106 g protein  Electrolytes: Mag slightly increased; Cl:Ac = 1:1; no changes today  No SSI needed  Continue IVF as ordered  TPN lab panels on Mondays & Thursdays   Adrian Saran, PharmD, BCPS Pager [phone] 11/12/2018 10:32 AM

## 2018-11-12 NOTE — Progress Notes (Signed)
Subjective: CC:  Patient without complaints this morning. Denies N/V. Passing flatus. BM yesterday, formed but somewhat loose.   Objective: Vital signs in last 24 hours: Temp:  [97.6 F (36.4 C)-98.2 F (36.8 C)] 98.2 F (36.8 C) (03/02 0536) Pulse Rate:  [84-95] 84 (03/02 0536) Resp:  [15-18] 18 (03/02 0536) BP: (118-129)/(63-78) 125/63 (03/02 0536) SpO2:  [100 %] 100 % (03/02 0536) Last BM Date: 11/11/18  Intake/Output from previous day: 03/01 0701 - 03/02 0700 In: 1870 [P.O.:180; I.V.:1550; IV Piggyback:100] Out: 2360 [Urine:1800; Emesis/NG output:350; Drains:210] Intake/Output this shift: No intake/output data recorded.  PE: Gen:  Frail appearing. Alert, NAD, pleasant.  Cardio: RRR Pulm:  CTAB, no W/R/R, effort normal Abd: Soft, ND, NT, +BS, 4 abdominal drains.   -RUQ -  50cc overnight. Serosanguinous fluid with some purulent/milky fluid in JP drain  -RLQ - 65cc overnight. Purulent fluid in JP drain  -LUQ 50cc overnight. Serosanginous drainage in JP drain  -LLQ 45cc overnight. Serosanginous drainage in JP drain Ext:  No BLE edema Skin: no rashes noted, warm and dry  Lab Results:  Recent Labs    11/12/18 0416  WBC 10.1  HGB 9.6*  HCT 30.0*  PLT 438*   BMET Recent Labs    11/12/18 0416  NA 135  K 4.0  CL 103  CO2 26  GLUCOSE 101*  BUN 20  CREATININE 0.70  CALCIUM 8.6*   PT/INR No results for input(s): LABPROT, INR in the last 72 hours. CMP     Component Value Date/Time   NA 135 11/12/2018 0416   K 4.0 11/12/2018 0416   CL 103 11/12/2018 0416   CO2 26 11/12/2018 0416   GLUCOSE 101 (H) 11/12/2018 0416   BUN 20 11/12/2018 0416   CREATININE 0.70 11/12/2018 0416   CALCIUM 8.6 (L) 11/12/2018 0416   PROT 7.1 11/12/2018 0416   ALBUMIN 2.0 (L) 11/12/2018 0416   AST 28 11/12/2018 0416   ALT 23 11/12/2018 0416   ALKPHOS 215 (H) 11/12/2018 0416   BILITOT 0.3 11/12/2018 0416   GFRNONAA >60 11/12/2018 0416   GFRAA >60 11/12/2018 0416    Lipase     Component Value Date/Time   LIPASE 30 10/25/2018 1721       Studies/Results: No results found.  Anti-infectives: Anti-infectives (From admission, onward)   Start     Dose/Rate Route Frequency Ordered Stop   11/09/18 0600  ceFAZolin (ANCEF) IVPB 2g/100 mL premix     2 g 200 mL/hr over 30 Minutes Intravenous On call 11/08/18 1546 11/09/18 0900   10/26/18 0400  piperacillin-tazobactam (ZOSYN) IVPB 3.375 g     3.375 g 12.5 mL/hr over 240 Minutes Intravenous Every 8 hours 10/26/18 0245     10/25/18 1930  piperacillin-tazobactam (ZOSYN) IVPB 3.375 g     3.375 g 100 mL/hr over 30 Minutes Intravenous  Once 10/25/18 1928 10/25/18 2021       Assessment/Plan Hx of perforated gastric ulcer - s/p repair in 2003 Hx of medication non-compliance AKI- improving  Free air with ascites and thickened bowel loops -Benign exam and no signs of sepsis - this is likely subacute - this could be secondary to obstruction vs recurrent ulcer diseasevs perforated gastric cancer? - CA 19-9 elevated - patient is significantly malnourished with temporal wasting - needs TPN and nutritional support prior to any operative intervention - continue PPIBID, TPN and NPO - UGI study 2/19 showed no definitive leak, free air still seen - MR  abdomen 2/21 shows gastric thickening - s/p IR drain placement x2. Culture: RARE ERYSIPELOTHRIX RHUSIOPATHIAE - CT scan 2/27 shows decreased free air, 2 persistent abscess with drains in place and 2 new fluid collections - s/p initial IR drain x2 upsized/replaced, 2 new IR drains placed - GI holding off on EGD for now due to persistent free air, hopefully next week  Severe protein calorie malnutrition- continue TPN and NG for now  FEN: NPO, TPN, IVF; protonixBID VTE: SCDs, sq heparin ID: zosyn 2/13>> Foley: none  Plan: Continue IR drains and abx. Continue NG tube. No further surgical needs until EGD complete. Will follow peripherally until CT scan  is done likely later this week and GI determines when EGD will be.    LOS: 18 days    Jacinto Halim , [facility] 11/12/2018, 9:51 AM Pager: [phone]

## 2018-11-12 NOTE — Progress Notes (Signed)
PROGRESS NOTE    [name]  FAO:[id number] DOB: 17-Jun-1956 DOA: 10/25/2018 PCP: Vincent Park, No Pcp Per  Brief Narrative:63 year old [place] male with history of perforated ulcer in 2003 status post repair, reflux, severe protein calorie malnutrition admitted 2/13 with abdominal pain for 2 weeks. Vincent Park recently moved from [place] to [place]. Has no PCP. Has not seen a physician in years.  In ED, CT abdomen showed dilated thickened small bowel with transition point for obstruction with free air concerning for pneumoperitoneum. General surgery consulted manage conservatively with NG tube decompression and empiric antibiotic (Zosyn 2/14-->). Vincent Park had a single column upper GI series performed on 2/19 that showed large ulcer without definite leak.   Bowel obstruction, history of gastric ulcer and significant muscle mass wasting raised concern for undiagnosed malignancy. Cancer markers including CA-125, CEA and CA 19-9 ordered. The later elevated to 202. MRI abdomen on 2/21 showed multiple intra-abdominal abscesses, 15.1 x 8.3 x 21 cm on the left abdomen and 8.3 x 14.8 x 21 cm in the right abdomen. There was also a masslike nonspecific wall thickening involving the proximal stomach. IR consulted and drained the abscesses and placed drains. Gram stain positive for GPC's. Culture negative. [facility] consulted on 2/22 and following.   In regards to malnutrition, started on TPN with the guidance of nutrition and pharmacy. NG tube retained per general surgery recommendation.  Assessment & Plan:   Principal Problem:   SBO (small bowel obstruction) (HCC) Active Problems:   AKI (acute kidney injury) (HCC)   Cachexia (HCC)   PUD (peptic ulcer disease)   Moderate alcohol consumption   GERD (gastroesophageal reflux disease)   Pneumoperitoneum   Protein-calorie malnutrition, severe (HCC)   Intra-abdominal abscess (HCC)  SBO/pneumoperitoneum/intra-abdominal abscesses/ascites:  -NG  remains in place per general surgery recommendation.CT scan of the abdomen 11/08/2018 persistent left lateral abdominal fluid collection as well as 2 new additional fluid collection in the right lower quadrant and in the deep central pelvis.  Had upsizing of right and left lower quadrant drains and placement of new drains on both sides. Done 11/09/2018.  -MRI abdomen on 2/21 showed multiple intra-abdominal abscesses and nonspecific proximal stomach wall thickening -Abscesses drained and drains placed on 2/21 by IR.Still draining purulent from both the drains. Gram stain positive for GPC's. Culture negative. -[facility] following -Zosyn 2/14--> -Continue TPN-appreciate dietitian and pharmacy help -Continue PPI. EGD was  canceled as Vincent Park having intra-abdominal drains placed-this was supposed to be done due to proximal stomach wall thickening by MRI rule out malignancy.  AKI: Resolved. -Avoid nephrotoxic meds.  Severe protein calorie malnutrition: Improving. -Continue TPN-per nutrition and pharmacy -Monitor electrolytes  Normocytic anemia:Likely anemia of chronic disease. -Continue monitoring  Nutrition Problem: Severe Malnutrition Etiology: chronic illness(PUD with surgery  DVT prophylaxis:Lovenox Code Status:Full code Family Communication:No family available disposition Plan:Unknown  Consultants:  General surgery and GI and interventional radiology  Procedures:NG tube  abdominal drains 11/02/2018 Antimicrobials:Zosyn    Nutrition Problem: Severe Malnutrition Etiology: chronic illness(PUD with surgery)     Signs/Symptoms: energy intake < or equal to 75% for > or equal to 1 month, severe fat depletion, severe muscle depletion, percent weight loss    Interventions: TPN  Estimated body mass index is 16.16 kg/m as calculated from the following:   Height as of this encounter: 6\' 2"  (1.88 m).   Weight as of this encounter: 57.1  kg.    Subjective:  Vincent Park resting in bed Objective: Elderly male frail chronically ill looking cachectic appearing decreased  muscle mass Vitals:   11/11/18 0644 11/11/18 1501 11/11/18 2107 11/12/18 0536  BP: (!) 124/98 129/78 118/63 125/63  Pulse: 90 95 87 84  Resp: Temp: 98 F (36.7 C) 97.8 F (36.6 C) 97.6 F (36.4 C) 98.2 F (36.8 C)  TempSrc: Oral Oral Oral Oral  SpO2: 100% 100% 100% 100%  Weight:      Height:        Intake/Output Summary (Last 24 hours) at 11/12/2018 1127 Last data filed at 11/12/2018 1000 Gross per 24 hour  Intake 2173.44 ml  Output 2632 ml  Net -458.56 ml   Filed Weights   10/25/18 2218 10/26/18 1258 11/02/18 1006  Weight: 54 kg 54.8 kg 57.1 kg    Examination:  General exam: Appears calm and comfortable  Respiratory system: Clear to auscultation. Respiratory effort normal. Cardiovascular system: S1 & S2 heard, RRR. No JVD, murmurs, rubs, gallops or clicks. No pedal edema. Gastrointestinal system: Abdomen is nondistended, soft and nontender. No organomegaly or masses felt. Normal bowel sounds heard.  4 drains in place on both sides of the abdomen draining purulent drainage Central nervous system: Alert and oriented. No focal neurological deficits. Extremities: Symmetric 5 x 5 power. Skin: No rashes, lesions or ulcers Psychiatry: Judgement and insight appear normal. Mood & affect appropriate.     Data Reviewed: I have personally reviewed following labs and imaging studies  CBC: Recent Labs  Lab 11/07/18 0520 11/12/18 0416  WBC 6.8 10.1  NEUTROABS  --  7.6  HGB 9.6* 9.6*  HCT 29.2* 30.0*  MCV 103.5* 103.4*  PLT 308 438*   Basic Metabolic Panel: Recent Labs  Lab 11/07/18 0520 11/08/18 0349 11/12/18 0416  NA 135 136 135  K 3.8 4.1 4.0  CL 102 102 103  CO2 GLUCOSE 114* 108* 101*  BUN CREATININE 0.72 0.73 0.70  CALCIUM 8.3* 8.5* 8.6*  MG  --  1.9 1.9  PHOS  --  4.3 3.9   GFR: Estimated  Creatinine Clearance: 75.3 mL/min (by C-G formula based on SCr of 0.7 mg/dL). Liver Function Tests: Recent Labs  Lab 11/08/18 0349 11/11/18 0519 11/12/18 0416  AST ALT ALKPHOS 178* 206* 215*  BILITOT 0.3 0.3 0.3  PROT 7.3 7.2 7.1  ALBUMIN 2.0* 1.9* 2.0*   No results for input(s): LIPASE, AMYLASE in the last 168 hours. No results for input(s): AMMONIA in the last 168 hours. Coagulation Profile: Recent Labs  Lab 11/08/18 1641  INR 1.0   Cardiac Enzymes: No results for input(s): CKTOTAL, CKMB, CKMBINDEX, TROPONINI in the last 168 hours. BNP (last 3 results) No results for input(s): PROBNP in the last 8760 hours. HbA1C: No results for input(s): HGBA1C in the last 72 hours. CBG: Recent Labs  Lab 11/06/18 2351  GLUCAP 105*   Lipid Profile: Recent Labs    11/12/18 0416  TRIG 92   Thyroid Function Tests: No results for input(s): TSH, T4TOTAL, FREET4, T3FREE, THYROIDAB in the last 72 hours. Anemia Panel: No results for input(s): VITAMINB12, FOLATE, FERRITIN, TIBC, IRON, RETICCTPCT in the last 72 hours. Sepsis Labs: No results for input(s): PROCALCITON, LATICACIDVEN in the last 168 hours.  Recent Results (from the past 240 hour(s))  Aerobic/Anaerobic Culture (surgical/deep wound)     Status: None   Collection Time: 11/02/18  4:42 PM  Result Value Ref Range Status   Specimen Description   Final  ABSCESS Performed at [facility], [address] [place], [place] [address]    Special Requests INTRA ABDOMINAL  Final   Gram Stain   Final    ABUNDANT WBC PRESENT, PREDOMINANTLY PMN ABUNDANT GRAM POSITIVE COCCOBACILLUS    Culture   Final    RARE ERYSIPELOTHRIX RHUSIOPATHIAE Standardized susceptibility testing for this organism is not available. NO ANAEROBES ISOLATED Performed at [facility], [address] [place], [place] [address]    Report Status 11/07/2018 FINAL  Final         Radiology Studies: No results  found.      Scheduled Meds: . heparin injection (subcutaneous)  5,000 Units Subcutaneous Q8H  . pantoprazole (PROTONIX) IV  40 mg Intravenous Q12H  . sodium chloride flush  10-40 mL Intracatheter Q12H  . sodium chloride flush  5 mL Intracatheter Q8H  . sodium chloride flush  5 mL Intracatheter Q8H   Continuous Infusions: . sodium chloride 50 mL/hr at 11/12/18 0600  . sodium chloride 10 mL/hr at 11/12/18 0600  . piperacillin-tazobactam (ZOSYN)  IV 3.375 g (11/12/18 0535)  . TPN ADULT (ION) 80 mL/hr at 11/12/18 0600  . TPN ADULT (ION)       LOS: 18 days    Alwyn Ren, MD [facility]  If 7PM-7AM, please contact night-coverage [email or web address] Password [facility] 11/12/2018, 11:27 AM

## 2018-11-13 MED ORDER — TRAVASOL 10 % IV SOLN
INTRAVENOUS | Status: AC
  Administered 2018-11-13: 18:00:00 via INTRAVENOUS
  Filled 2018-11-13: qty 1056

## 2018-11-13 NOTE — Progress Notes (Signed)
PHARMACY - ADULT TOTAL PARENTERAL NUTRITION CONSULT NOTE   Pharmacy Consult for TPN Indication: bowel rest, severe malnutrition PTA  Patient Measurements: Height: 6' 2"  (188 cm) Weight: 125 lb 14.1 oz (57.1 kg) IBW/kg (Calculated) : 82.2 TPN AdjBW (KG): 54 Body mass index is 16.16 kg/m. Usual Weight: unknown as patient is poor historian, but reports significant weight loss over the past year  HPI: 65 yoM with PMH PUD with perforated gastric ulcer s/p repair in 2003, GERD presents with worsening chronic abdominal pain and associated weight loss. Cachectic appearing. CT reveals ascites with concern for SBO vs recurrent ulcer disease. Pt is NPO. Consulted by surgery for TPN management. Pt will require nutritional support prior to any surgical intervention.  Current Nutrition: NPO/chips IVF: NS at 50 mL/hr  Central access: Double lumen PICC placed 2/14 TPN start date: 2/14  Significant events:  2/27 KUB w/ persistent free air, CT scan w/ persistent abd fluid collection plus 2 additional fluid collections RLQ anda deep central pelvis 2/28 to IR for drain exchanges/upsize x 2, place 2 more drains if possible 3/3: per notes, pt not candidate for surgery right now, needs more bowel rest, needs EGD closer to when is surgical candidate, to reevaluate next week  ASSESSMENT                                                                                               Today, 11/13/2018: Below labs are from 3/2, no labs checked 3/3 due to labs consistently being WNL  Glucose (goal 100-150): no longer checking CBGs d/t excellent control;CBGs stable within goal range  Electrolytes - stable WNL  Renal - SCr, bicarb, BUN, UOP 1.8L last 24hr - less than previous 24  LFTs, Alk Phos continues to rise slowly; still less than 2x ULN; other enzymes stable; albumin remains low. Doubt alk phos is acutely elevated d/t TPN  TGs - WNL on 2/24 and 3/2  Prealbumin - 17.8 (3/2) improving  NUTRITIONAL GOALS                                                                                  RD recs (per note on 2/14): Kcal: 1900 - 2100 grams Protein: 95 - 115 g Fluid: >/= 1.9 L/day  PLAN  At 1800 today:  Continue TPN at goal rate of 80 mL/hr providing 100% of patient needs with 1978 kcal and 106 g protein  Electrolytes: Mag slightly increased; Cl:Ac = 1:1; no changes today  No SSI needed  Continue IVF as ordered  TPN lab panels on Mondays & Thursdays   Adrian Saran, PharmD, BCPS Pager [phone] 11/13/2018 10:43 AM

## 2018-11-13 NOTE — Progress Notes (Signed)
PROGRESS NOTE    V[place]cent [place]  SWF:[id number] DOB: February 23, 1956 DOA: 10/25/2018 PCP: Patient, No Pcp Per  Brief Narrative: 63 year old [place] male with history of perforated ulcer in 2003 status post repair, reflux, severe protein calorie malnutrition admitted 2/13 with abdominal pain for 2 weeks. Patient recently moved from [place] to [place]. Has no PCP. Has not seen a physician in years.  In ED, CT abdomen showed dilated thickened small bowel with transition point for obstruction with free air concerning for pneumoperitoneum. General surgery consulted manage conservatively with NG tube decompression and empiric antibiotic (Zosyn 2/14-->). Patient had a single column upper GI series performed on 2/19 that showed large ulcer without definite leak.   Bowel obstruction, history of gastric ulcer and significant muscle mass wasting raised concern for undiagnosed malignancy. Cancer markers including CA-125, CEA and CA 19-9 ordered. The later elevated to 202. MRI abdomen on 2/21 showed multiple intra-abdominal abscesses, 15.1 x 8.3 x 21 cm on the left abdomen and 8.3 x 14.8 x 21 cm in the right abdomen. There was also a masslike nonspecific wall thickening involving the proximal stomach. IR consulted and drained the abscesses and placed drains. Gram stain positive for GPC's. Culture negative. [facility] consulted on 2/22 and following.   In regards to malnutrition, started on TPN with the guidance of nutrition and pharmacy. NG tube retained per general surgery recommendation.   Assessment & Plan:   Principal Problem:   SBO (small bowel obstruction) (HCC) Active Problems:   AKI (acute kidney injury) (HCC)   Cachexia (HCC)   PUD (peptic ulcer disease)   Moderate alcohol consumption   GERD (gastroesophageal reflux disease)   Pneumoperitoneum   Protein-calorie malnutrition, severe (HCC)   Intra-abdominal abscess (HCC)  SBO/pneumoperitoneum/intra-abdominal abscesses/ascites:  -NG  remains in place per general surgery recommendation.CT scan of the abdomen 11/08/2018 persistent left lateral abdominal fluid collection as well as 2 new additional fluid collection in the right lower quadrant and in the deep central pelvis.  Had upsizing of right and left lower quadrant drains and placement of new drains on both sides. Done 11/09/2018. [facility] following -Zosyn 2/14--> -Continue TPN-appreciate dietitian and pharmacy help -Continue PPI. EGD to be done prior to surgery by GI.  Patient had proximal stomach wall thickening by MRI.   AKI: Resolved. -Avoid nephrotoxic meds.  Severe protein calorie malnutrition: Improving. -Continue TPN-per nutrition and pharmacy -Monitor electrolytes   Normocytic anemia:Likely anemia of chronic disease. -Continue monitoring  Nutrition Problem: Severe Malnutrition Etiology: chronic illness(PUD with surgery  DVT prophylaxis:Lovenox Code Status:Full code Family Communication:No family available disposition Plan:Unknown  Consultants:  General surgery and GI and interventional radiology  Procedures:NG tube  abdominal drains 11/02/2018 Antimicrobials:Zosyn     Nutrition Problem: Severe Malnutrition Etiology: chronic illness(PUD with surgery)     Signs/Symptoms: energy intake < or equal to 75% for > or equal to 1 month, severe fat depletion, severe muscle depletion, percent weight loss    Interventions: TPN  Estimated body mass index is 16.16 kg/m as calculated from the following:   Height as of this encounter: 6\' 2"  (1.88 m).   Weight as of this encounter: 57.1 kg.   Subjective: Patient resting in bed in no acute distress awake alert slept well having flatus still have NG tube in place  Objective: Vitals:   11/12/18 0536 11/12/18 1343 11/12/18 2204 11/13/18 0502  BP: 125/63 122/67 133/65 118/71  Pulse: 84 76 87 92  Resp: 18 18 20 20   Temp: 98.2 F (36.8 C)  98.6 F (37 C) 99 F (37.2 C) 99 F  (37.2 C)  TempSrc: Oral Oral Oral Oral  SpO2: 100% 100% 100% 99%  Weight:      Height:        Intake/Output Summary (Last 24 hours) at 11/13/2018 1241 Last data filed at 11/13/2018 1022 Gross per 24 hour  Intake 3473.71 ml  Output 2865 ml  Net 608.71 ml   Filed Weights   10/25/18 2218 10/26/18 1258 11/02/18 1006  Weight: 54 kg 54.8 kg 57.1 kg    Examination:  General exam: Appears calm and comfortable  Respiratory system: Clear to auscultation. Respiratory effort normal. Cardiovascular system: S1 & S2 heard, RRR. No JVD, murmurs, rubs, gallops or clicks. No pedal edema. Gastrointestinal system: Abdomen is nondistended, soft and nontender. No organomegaly or masses felt. Normal bowel sounds heard.  4 drains in place with decreased drainage noted. Central nervous system: Alert and oriented. No focal neurological deficits. Extremities: Symmetric 5 x 5 power. Skin: No rashes, lesions or ulcers Psychiatry: Judgement and insight appear normal. Mood & affect appropriate.     Data Reviewed: I have personally reviewed following labs and imaging studies  CBC: Recent Labs  Lab 11/07/18 0520 11/12/18 0416  WBC 6.8 10.1  NEUTROABS  --  7.6  HGB 9.6* 9.6*  HCT 29.2* 30.0*  MCV 103.5* 103.4*  PLT 308 438*   Basic Metabolic Panel: Recent Labs  Lab 11/07/18 0520 11/08/18 0349 11/12/18 0416  NA 135 136 135  K 3.8 4.1 4.0  CL 102 102 103  CO2 30 29 26   GLUCOSE 114* 108* 101*  BUN 18 19 20   CREATININE 0.72 0.73 0.70  CALCIUM 8.3* 8.5* 8.6*  MG  --  1.9 1.9  PHOS  --  4.3 3.9   GFR: Estimated Creatinine Clearance: 75.3 mL/min (by C-G formula based on SCr of 0.7 mg/dL). Liver Function Tests: Recent Labs  Lab 11/08/18 0349 11/11/18 0519 11/12/18 0416  AST 28 27 28   ALT 25 23 23   ALKPHOS 178* 206* 215*  BILITOT 0.3 0.3 0.3  PROT 7.3 7.2 7.1  ALBUMIN 2.0* 1.9* 2.0*   No results for input(s): LIPASE, AMYLASE in the last 168 hours. No results for input(s): AMMONIA in  the last 168 hours. Coagulation Profile: Recent Labs  Lab 11/08/18 1641  INR 1.0   Cardiac Enzymes: No results for input(s): CKTOTAL, CKMB, CKMBINDEX, TROPONINI in the last 168 hours. BNP (last 3 results) No results for input(s): PROBNP in the last 8760 hours. HbA1C: No results for input(s): HGBA1C in the last 72 hours. CBG: Recent Labs  Lab 11/06/18 2351  GLUCAP 105*   Lipid Profile: Recent Labs    11/12/18 0416  TRIG 92   Thyroid Function Tests: No results for input(s): TSH, T4TOTAL, FREET4, T3FREE, THYROIDAB in the last 72 hours. Anemia Panel: No results for input(s): VITAMINB12, FOLATE, FERRITIN, TIBC, IRON, RETICCTPCT in the last 72 hours. Sepsis Labs: No results for input(s): PROCALCITON, LATICACIDVEN in the last 168 hours.  No results found for this or any previous visit (from the past 240 hour(s)).       Radiology Studies: No results found.      Scheduled Meds: . heparin injection (subcutaneous)  5,000 Units Subcutaneous Q8H  . pantoprazole (PROTONIX) IV  40 mg Intravenous Q12H  . sodium chloride flush  10-40 mL Intracatheter Q12H  . sodium chloride flush  5 mL Intracatheter Q8H  . sodium chloride flush  5 mL Intracatheter Q8H   Continuous  Infusions: . sodium chloride 50 mL/hr at 11/13/18 0207  . sodium chloride 10 mL/hr at 11/12/18 0600  . piperacillin-tazobactam (ZOSYN)  IV 3.375 g (11/13/18 0519)  . TPN ADULT (ION) 80 mL/hr at 11/12/18 1845  . TPN ADULT (ION)       LOS: 19 days     Alwyn Ren, MD [facility]  If 7PM-7AM, please contact night-coverage [email or web address] Password [facility] 11/13/2018, 12:41 PM

## 2018-11-13 NOTE — Progress Notes (Signed)
Referring Physician(s): Gonfa, T.  Supervising Physician: Jolaine Click  Patient Status:  [facility] - In-pt  Chief Complaint: S/p exchange/upsize of bilateral flank drains; placement of biliary drains 2/28  Subjective: Patient slept on and off last night because couldn't get comfortable, but no abdominal pain or tenderness noted. Drain sites are not painful, but patient does describe a burning sensation at drain site if he is laying on top of the drain. Patient denies nausea, vomiting, fever, back pain, and bleeding.   Allergies: Patient has no known allergies.  Medications: Prior to Admission medications   Medication Sig Start Date End Date Taking? Authorizing Provider  Alum & Mag Hydroxide-Simeth (GI COCKTAIL) SUSP suspension Take 30 mLs by mouth 2 (two) times daily. Shake well. 10/10/18  Yes Gwyneth Sprout, MD  omeprazole (PRILOSEC) 20 MG capsule Take 1 capsule (20 mg total) by mouth daily. 10/10/18  Yes Gwyneth Sprout, MD     Vital Signs: BP 118/71 (BP Location: Left Wrist)   Pulse 92   Temp 99 F (37.2 C) (Oral)   Resp 20   Ht  (1.88 m)   Wt 125 lb 14.1 oz (57.1 kg)   SpO2 99%   BMI 16.16 kg/m   Physical Exam  Patient awake and alert, resting comfortably in bed. Abdomen is soft and non-distended. No tenderness to palpation of abdomen. Some mild discomfort upon palpation of tube sites. The tube sites are clean and dry, with no erythema or discharge around wounds. Blood tinged drainage still being produced; 4 JP bulbs present.  Labs are stable; combined drain output 57 cc in last 24 hours  Imaging: Ir Catheter Tube Change  Result Date: 11/09/2018 INDICATION: 63 year old male with florid infectious peritonitis and extensive intraperitoneal abscesses. He was previously treated by placement of bilateral percutaneous drainage catheters on 11/02/2018. Recurrent CT imaging demonstrates ineffective drainage. He presents today for drain exchange, up size and placement  of additional drainage catheters. EXAM: 1. Drain injection, exchange and up size of the existing right upper quadrant drainage catheter. 2. Placement of a new right lower quadrant drainage catheter using ultrasound and fluoroscopic guidance. 3. Drain injection, exchange and up size of the existing left upper quadrant drainage catheter. 4. Placement of a new left lower quadrant drainage catheter using ultrasound and fluoroscopic guidance. MEDICATIONS: 2 g Ancef ANESTHESIA/SEDATION: Fentanyl 100 mcg IV; Versed 3 mg IV Moderate Sedation Time:  40 minutes The patient was continuously monitored during the procedure by the interventional radiology nurse under my direct supervision. FLUOROSCOPY TIME:  3 minutes 24 seconds, 10 mGy COMPLICATIONS: None immediate. PROCEDURE: Informed written consent was obtained from the patient after a thorough discussion of the procedural risks, benefits and alternatives. All questions were addressed. Maximal Sterile Barrier Technique was utilized including caps, mask, sterile gowns, sterile gloves, sterile drape, hand hygiene and skin antiseptic. A timeout was performed prior to the initiation of the procedure. Attention was first turned to the existing right upper quadrant drainage catheter which enters the peritoneal cavity from the right mid abdomen. A gentle hand injection of contrast material was performed. There is a persistent abscess cavity around the tip of the catheter. The injected contrast material extends inferiorly along the right abdominal wall nearly to the skin entry site. The decision was made to proceed with drain exchange and up size. The drainage type also be transition to a biliary drain to facilitate drainage of the entire length of the collection. The retention suture was cut and released. The catheter was  transected and removed over an Amplatz wire. The skin tract was dilated to 7214 [place] and a 214 [place] biliary drainage catheter was advanced over the wire and  formed. Injection through the biliary drain demonstrates its location within the right abdominal fluid collection. The drainage catheter was secured to the skin with 0 Prolene suture and connected to JP bulb suction. Attention was next turned to the right lower quadrant where there was a known fluid collection on the prior CT scan. Ultrasound reveals a complex fluid pocket consistent with peritoneal fluid. A skin entry site was marked. Local anesthesia was attained by infiltration with 1% lidocaine. A small dermatotomy was made. Under real-time sonographic guidance, an 18 gauge single wall needle was advanced into the fluid collection. A 0.018 wire was then advanced through the needle and the needle was removed. The skin tract was dilated to 5414 [place] and a Cook 14 [place] percutaneous drain was advanced into the fluid collection and formed. A hand injection of contrast material reveals that the fluid collection extends inferiorly into the right inguinal recess. Therefore, the catheter was manipulated into the more dependent aspect in the right inguinal recess. The catheter was secured to the skin with 0 Prolene suture and connected to JP bulb suction. Attention was next turned to the existing left upper quadrant drain. Again, a hand injection of contrast material was performed. This demonstrates a large persistent abscess cavity. The internal fluid appears quite thick. The decision was made to proceed with up sizing of this drainage catheter to a larger drain to facilitate drainage. The retention suture was cut. The catheter was transected and removed over an Amplatz wire. The skin tract was dilated to 6216 [place] and a 16 [place] Thal drainage catheter was advanced over the wire and formed in the left upper quadrant. The catheter was connected to JP bulb suction and secured to the skin with 0 Prolene suture. This fluid collection is known to extend significantly along the left abdominal wall and into the pelvis.  Therefore, ultrasound was again used to evaluate the fluid collection. There is a large fluid collection in the left abdomen. A suitable skin entry site was selected and marked. Local anesthesia was attained by infiltration with 1% lidocaine. A small dermatotomy was made. Under real-time sonographic guidance, an 18 gauge single wall needle was used to puncture the fluid collection. An Amplatz wire was advanced in an inferior direction toward the left inguinal recess. The 18 gauge needle was removed. The skin tract was dilated to 3914 [place] and a 5114 [place] biliary drain was advanced over the wire and positioned in the anatomic pelvis. Contrast injection confirms that the catheter is located within the peritoneal fluid. The catheter was flushed and connected to JP bulb suction and secured to the skin with 0 Prolene suture. IMPRESSION: 1. Exchange and up size of existing right upper quadrant drainage catheter to a 14 [place] biliary drain. 2. Placement of a new right lower quadrant 14 French all-purpose drainage catheter. 3. Exchange and up size of existing left upper quadrant drainage catheter to a 16 [place] Thal drain. 4. Placement of a new left lower quadrant 14 French biliary drainage catheter. PLAN: 1. Maintain all tubes to JP bulb suction. 2. All tubes should be flushed at least once per shift. 3. Once drain output has diminished or ceased, additional CT imaging of the abdomen and pelvis with intravenous contrast should be obtained prior to drain removal. Signed, Sterling BigHeath K. McCullough, MD, RPVI [facility] Radiology Electronically Signed   By: Malachy Moan M.D.   On: 11/09/2018 10:43   Ir Catheter Tube Change  Result Date: 11/09/2018 INDICATION: 63 year old male with florid infectious peritonitis and extensive intraperitoneal abscesses. He was previously treated by placement of bilateral percutaneous drainage catheters on 11/02/2018. Recurrent CT imaging  demonstrates ineffective drainage. He presents today for drain exchange, up size and placement of additional drainage catheters. EXAM: 1. Drain injection, exchange and up size of the existing right upper quadrant drainage catheter. 2. Placement of a new right lower quadrant drainage catheter using ultrasound and fluoroscopic guidance. 3. Drain injection, exchange and up size of the existing left upper quadrant drainage catheter. 4. Placement of a new left lower quadrant drainage catheter using ultrasound and fluoroscopic guidance. MEDICATIONS: 2 g Ancef ANESTHESIA/SEDATION: Fentanyl 100 mcg IV; Versed 3 mg IV Moderate Sedation Time:  40 minutes The patient was continuously monitored during the procedure by the interventional radiology nurse under my direct supervision. FLUOROSCOPY TIME:  3 minutes 24 seconds, 10 mGy COMPLICATIONS: None immediate. PROCEDURE: Informed written consent was obtained from the patient after a thorough discussion of the procedural risks, benefits and alternatives. All questions were addressed. Maximal Sterile Barrier Technique was utilized including caps, mask, sterile gowns, sterile gloves, sterile drape, hand hygiene and skin antiseptic. A timeout was performed prior to the initiation of the procedure. Attention was first turned to the existing right upper quadrant drainage catheter which enters the peritoneal cavity from the right mid abdomen. A gentle hand injection of contrast material was performed. There is a persistent abscess cavity around the tip of the catheter. The injected contrast material extends inferiorly along the right abdominal wall nearly to the skin entry site. The decision was made to proceed with drain exchange and up size. The drainage type also be transition to a biliary drain to facilitate drainage of the entire length of the collection. The retention suture was cut and released. The catheter was transected and removed over an Amplatz wire. The skin tract was  dilated to 4 [place] and a 8 [place] biliary drainage catheter was advanced over the wire and formed. Injection through the biliary drain demonstrates its location within the right abdominal fluid collection. The drainage catheter was secured to the skin with 0 Prolene suture and connected to JP bulb suction. Attention was next turned to the right lower quadrant where there was a known fluid collection on the prior CT scan. Ultrasound reveals a complex fluid pocket consistent with peritoneal fluid. A skin entry site was marked. Local anesthesia was attained by infiltration with 1% lidocaine. A small dermatotomy was made. Under real-time sonographic guidance, an 18 gauge single wall needle was advanced into the fluid collection. A 0.018 wire was then advanced through the needle and the needle was removed. The skin tract was dilated to 54 [place] and a Cook 14 [place] percutaneous drain was advanced into the fluid collection and formed. A hand injection of contrast material reveals that the fluid collection extends inferiorly into the right inguinal recess. Therefore, the catheter was manipulated into the more dependent aspect in the right inguinal recess. The catheter was secured to the skin with 0 Prolene suture and connected to JP bulb suction. Attention was next turned to the existing left upper quadrant drain. Again, a hand injection of contrast material was performed. This demonstrates a large persistent abscess cavity. The internal fluid appears quite thick. The decision was made to proceed with up sizing of this drainage  catheter to a larger drain to facilitate drainage. The retention suture was cut. The catheter was transected and removed over an Amplatz wire. The skin tract was dilated to 53 [place] and a 16 [place] Thal drainage catheter was advanced over the wire and formed in the left upper quadrant. The catheter was connected to JP bulb suction and secured to the skin with 0 Prolene suture. This fluid  collection is known to extend significantly along the left abdominal wall and into the pelvis. Therefore, ultrasound was again used to evaluate the fluid collection. There is a large fluid collection in the left abdomen. A suitable skin entry site was selected and marked. Local anesthesia was attained by infiltration with 1% lidocaine. A small dermatotomy was made. Under real-time sonographic guidance, an 18 gauge single wall needle was used to puncture the fluid collection. An Amplatz wire was advanced in an inferior direction toward the left inguinal recess. The 18 gauge needle was removed. The skin tract was dilated to 23 [place] and a 66 [place] biliary drain was advanced over the wire and positioned in the anatomic pelvis. Contrast injection confirms that the catheter is located within the peritoneal fluid. The catheter was flushed and connected to JP bulb suction and secured to the skin with 0 Prolene suture. IMPRESSION: 1. Exchange and up size of existing right upper quadrant drainage catheter to a 14 [place] biliary drain. 2. Placement of a new right lower quadrant 14 French all-purpose drainage catheter. 3. Exchange and up size of existing left upper quadrant drainage catheter to a 16 [place] Thal drain. 4. Placement of a new left lower quadrant 14 French biliary drainage catheter. PLAN: 1. Maintain all tubes to JP bulb suction. 2. All tubes should be flushed at least once per shift. 3. Once drain output has diminished or ceased, additional CT imaging of the abdomen and pelvis with intravenous contrast should be obtained prior to drain removal. Signed, Sterling Big, MD, RPVI [facility] Radiology Electronically Signed   By: Malachy Moan M.D.   On: 11/09/2018 10:43   Ir US Guide Bx Asp/drain  Result Date: 11/09/2018 INDICATION: 63 year old male with florid infectious peritonitis and extensive intraperitoneal abscesses. He was previously treated by  placement of bilateral percutaneous drainage catheters on 11/02/2018. Recurrent CT imaging demonstrates ineffective drainage. He presents today for drain exchange, up size and placement of additional drainage catheters. EXAM: 1. Drain injection, exchange and up size of the existing right upper quadrant drainage catheter. 2. Placement of a new right lower quadrant drainage catheter using ultrasound and fluoroscopic guidance. 3. Drain injection, exchange and up size of the existing left upper quadrant drainage catheter. 4. Placement of a new left lower quadrant drainage catheter using ultrasound and fluoroscopic guidance. MEDICATIONS: 2 g Ancef ANESTHESIA/SEDATION: Fentanyl 100 mcg IV; Versed 3 mg IV Moderate Sedation Time:  40 minutes The patient was continuously monitored during the procedure by the interventional radiology nurse under my direct supervision. FLUOROSCOPY TIME:  3 minutes 24 seconds, 10 mGy COMPLICATIONS: None immediate. PROCEDURE: Informed written consent was obtained from the patient after a thorough discussion of the procedural risks, benefits and alternatives. All questions were addressed. Maximal Sterile Barrier Technique was utilized including caps, mask, sterile gowns, sterile gloves, sterile drape, hand hygiene and skin antiseptic. A timeout was performed prior to the initiation of the procedure. Attention was first turned to the existing right upper quadrant drainage catheter which enters the peritoneal cavity from the right mid abdomen. A  gentle hand injection of contrast material was performed. There is a persistent abscess cavity around the tip of the catheter. The injected contrast material extends inferiorly along the right abdominal wall nearly to the skin entry site. The decision was made to proceed with drain exchange and up size. The drainage type also be transition to a biliary drain to facilitate drainage of the entire length of the collection. The retention suture was cut and  released. The catheter was transected and removed over an Amplatz wire. The skin tract was dilated to 41 [place] and a 69 [place] biliary drainage catheter was advanced over the wire and formed. Injection through the biliary drain demonstrates its location within the right abdominal fluid collection. The drainage catheter was secured to the skin with 0 Prolene suture and connected to JP bulb suction. Attention was next turned to the right lower quadrant where there was a known fluid collection on the prior CT scan. Ultrasound reveals a complex fluid pocket consistent with peritoneal fluid. A skin entry site was marked. Local anesthesia was attained by infiltration with 1% lidocaine. A small dermatotomy was made. Under real-time sonographic guidance, an 18 gauge single wall needle was advanced into the fluid collection. A 0.018 wire was then advanced through the needle and the needle was removed. The skin tract was dilated to 56 [place] and a Cook 14 [place] percutaneous drain was advanced into the fluid collection and formed. A hand injection of contrast material reveals that the fluid collection extends inferiorly into the right inguinal recess. Therefore, the catheter was manipulated into the more dependent aspect in the right inguinal recess. The catheter was secured to the skin with 0 Prolene suture and connected to JP bulb suction. Attention was next turned to the existing left upper quadrant drain. Again, a hand injection of contrast material was performed. This demonstrates a large persistent abscess cavity. The internal fluid appears quite thick. The decision was made to proceed with up sizing of this drainage catheter to a larger drain to facilitate drainage. The retention suture was cut. The catheter was transected and removed over an Amplatz wire. The skin tract was dilated to 6 [place] and a 16 [place] Thal drainage catheter was advanced over the wire and formed in the left upper quadrant. The catheter was  connected to JP bulb suction and secured to the skin with 0 Prolene suture. This fluid collection is known to extend significantly along the left abdominal wall and into the pelvis. Therefore, ultrasound was again used to evaluate the fluid collection. There is a large fluid collection in the left abdomen. A suitable skin entry site was selected and marked. Local anesthesia was attained by infiltration with 1% lidocaine. A small dermatotomy was made. Under real-time sonographic guidance, an 18 gauge single wall needle was used to puncture the fluid collection. An Amplatz wire was advanced in an inferior direction toward the left inguinal recess. The 18 gauge needle was removed. The skin tract was dilated to 48 [place] and a 61 [place] biliary drain was advanced over the wire and positioned in the anatomic pelvis. Contrast injection confirms that the catheter is located within the peritoneal fluid. The catheter was flushed and connected to JP bulb suction and secured to the skin with 0 Prolene suture. IMPRESSION: 1. Exchange and up size of existing right upper quadrant drainage catheter to a 14 [place] biliary drain. 2. Placement of a new right lower quadrant 14 French all-purpose drainage catheter. 3. Exchange and up size of existing  left upper quadrant drainage catheter to a 16 [place] Thal drain. 4. Placement of a new left lower quadrant 14 French biliary drainage catheter. PLAN: 1. Maintain all tubes to JP bulb suction. 2. All tubes should be flushed at least once per shift. 3. Once drain output has diminished or ceased, additional CT imaging of the abdomen and pelvis with intravenous contrast should be obtained prior to drain removal. Signed, Sterling Big, MD, RPVI [facility] Radiology Electronically Signed   By: Malachy Moan M.D.   On: 11/09/2018 10:43   Ir US Guide Bx Asp/drain  Result Date: 11/09/2018 INDICATION: 63 year old male with florid  infectious peritonitis and extensive intraperitoneal abscesses. He was previously treated by placement of bilateral percutaneous drainage catheters on 11/02/2018. Recurrent CT imaging demonstrates ineffective drainage. He presents today for drain exchange, up size and placement of additional drainage catheters. EXAM: 1. Drain injection, exchange and up size of the existing right upper quadrant drainage catheter. 2. Placement of a new right lower quadrant drainage catheter using ultrasound and fluoroscopic guidance. 3. Drain injection, exchange and up size of the existing left upper quadrant drainage catheter. 4. Placement of a new left lower quadrant drainage catheter using ultrasound and fluoroscopic guidance. MEDICATIONS: 2 g Ancef ANESTHESIA/SEDATION: Fentanyl 100 mcg IV; Versed 3 mg IV Moderate Sedation Time:  40 minutes The patient was continuously monitored during the procedure by the interventional radiology nurse under my direct supervision. FLUOROSCOPY TIME:  3 minutes 24 seconds, 10 mGy COMPLICATIONS: None immediate. PROCEDURE: Informed written consent was obtained from the patient after a thorough discussion of the procedural risks, benefits and alternatives. All questions were addressed. Maximal Sterile Barrier Technique was utilized including caps, mask, sterile gowns, sterile gloves, sterile drape, hand hygiene and skin antiseptic. A timeout was performed prior to the initiation of the procedure. Attention was first turned to the existing right upper quadrant drainage catheter which enters the peritoneal cavity from the right mid abdomen. A gentle hand injection of contrast material was performed. There is a persistent abscess cavity around the tip of the catheter. The injected contrast material extends inferiorly along the right abdominal wall nearly to the skin entry site. The decision was made to proceed with drain exchange and up size. The drainage type also be transition to a biliary drain to  facilitate drainage of the entire length of the collection. The retention suture was cut and released. The catheter was transected and removed over an Amplatz wire. The skin tract was dilated to 50 [place] and a 7 [place] biliary drainage catheter was advanced over the wire and formed. Injection through the biliary drain demonstrates its location within the right abdominal fluid collection. The drainage catheter was secured to the skin with 0 Prolene suture and connected to JP bulb suction. Attention was next turned to the right lower quadrant where there was a known fluid collection on the prior CT scan. Ultrasound reveals a complex fluid pocket consistent with peritoneal fluid. A skin entry site was marked. Local anesthesia was attained by infiltration with 1% lidocaine. A small dermatotomy was made. Under real-time sonographic guidance, an 18 gauge single wall needle was advanced into the fluid collection. A 0.018 wire was then advanced through the needle and the needle was removed. The skin tract was dilated to 71 [place] and a Cook 14 [place] percutaneous drain was advanced into the fluid collection and formed. A hand injection of contrast material reveals that the fluid collection extends inferiorly into the  right inguinal recess. Therefore, the catheter was manipulated into the more dependent aspect in the right inguinal recess. The catheter was secured to the skin with 0 Prolene suture and connected to JP bulb suction. Attention was next turned to the existing left upper quadrant drain. Again, a hand injection of contrast material was performed. This demonstrates a large persistent abscess cavity. The internal fluid appears quite thick. The decision was made to proceed with up sizing of this drainage catheter to a larger drain to facilitate drainage. The retention suture was cut. The catheter was transected and removed over an Amplatz wire. The skin tract was dilated to 37 [place] and a 16 [place] Thal drainage  catheter was advanced over the wire and formed in the left upper quadrant. The catheter was connected to JP bulb suction and secured to the skin with 0 Prolene suture. This fluid collection is known to extend significantly along the left abdominal wall and into the pelvis. Therefore, ultrasound was again used to evaluate the fluid collection. There is a large fluid collection in the left abdomen. A suitable skin entry site was selected and marked. Local anesthesia was attained by infiltration with 1% lidocaine. A small dermatotomy was made. Under real-time sonographic guidance, an 18 gauge single wall needle was used to puncture the fluid collection. An Amplatz wire was advanced in an inferior direction toward the left inguinal recess. The 18 gauge needle was removed. The skin tract was dilated to 10 [place] and a 13 [place] biliary drain was advanced over the wire and positioned in the anatomic pelvis. Contrast injection confirms that the catheter is located within the peritoneal fluid. The catheter was flushed and connected to JP bulb suction and secured to the skin with 0 Prolene suture. IMPRESSION: 1. Exchange and up size of existing right upper quadrant drainage catheter to a 14 [place] biliary drain. 2. Placement of a new right lower quadrant 14 French all-purpose drainage catheter. 3. Exchange and up size of existing left upper quadrant drainage catheter to a 16 [place] Thal drain. 4. Placement of a new left lower quadrant 14 French biliary drainage catheter. PLAN: 1. Maintain all tubes to JP bulb suction. 2. All tubes should be flushed at least once per shift. 3. Once drain output has diminished or ceased, additional CT imaging of the abdomen and pelvis with intravenous contrast should be obtained prior to drain removal. Signed, Sterling Big, MD, RPVI [facility] Radiology Electronically Signed   By: Malachy Moan M.D.   On: 11/09/2018 10:43     Labs:  CBC: Recent Labs    11/04/18 0806 11/05/18 0429 11/07/18 0520 11/12/18 0416  WBC 4.5 5.1 6.8 10.1  HGB 9.4* 9.1* 9.6* 9.6*  HCT 28.9* 28.2* 29.2* 30.0*  PLT 303 280 308 438*    COAGS: Recent Labs    10/26/18 0500 11/08/18 1641  INR 1.13 1.0    BMP: Recent Labs    11/05/18 0429 11/07/18 0520 11/08/18 0349 11/12/18 0416  NA 137 135 136 135  K 3.8 3.8 4.1 4.0  CL 104 102 102 103  CO2 29 30 29 26   GLUCOSE 114* 114* 108* 101*  BUN 17 18 19 20   CALCIUM 8.0* 8.3* 8.5* 8.6*  CREATININE 0.73 0.72 0.73 0.70  GFRNONAA >60 >60 >60 >60  GFRAA >60 >60 >60 >60    LIVER FUNCTION TESTS: Recent Labs    11/05/18 0429 11/08/18 0349 11/11/18 0519 11/12/18 0416  BILITOT 0.3 0.3 0.3 0.3  AST ALT ALKPHOS 152* 178* 206* 215*  PROT 6.4* 7.3 7.2 7.1  ALBUMIN 1.7* 2.0* 1.9* 2.0*    Assessment and Plan: Small bowel obstruction; intra-abdominal abscesses; protein calorie malnutrition Abscess culture - erysipelothrix rhusiopathiae Continue to flush drains TID and keep sites clean and dry; keep NG tube and NPO for bowel rest; monitor drain output and consider CT imaging if output drops under 10 cc in each drain to assess removal  Electronically Signed: D. Jeananne Rama, PA-C/Maxwell Dennie Bible, PA-S 11/13/2018, 9:35 AM   I spent a total of 15 Minutes at the the patient's bedside AND on the patient's hospital floor or unit, greater than 50% of which was counseling/coordinating care for abdominal flank drains    Patient ID: Vincent Park, male   DOB: 05-07-1956, 63 y.o.   MRN: [id number]

## 2018-11-14 LAB — MAGNESIUM: Magnesium: 1.8 mg/dL (ref 1.7–2.4)

## 2018-11-14 LAB — BASIC METABOLIC PANEL
Anion gap: 4 — ABNORMAL LOW (ref 5–15)
BUN: 21 mg/dL (ref 8–23)
CO2: 28 mmol/L (ref 22–32)
Calcium: 8.7 mg/dL — ABNORMAL LOW (ref 8.9–10.3)
Chloride: 102 mmol/L (ref 98–111)
Creatinine, Ser: 0.7 mg/dL (ref 0.61–1.24)
GFR calc Af Amer: >60 mL/min (ref >60)
GFR calc non Af Amer: >60 mL/min (ref >60)
GLUCOSE: 102 mg/dL — AB (ref 70–99)
Potassium: 4.1 mmol/L (ref 3.5–5.1)
Sodium: 134 mmol/L — ABNORMAL LOW (ref 135–145)

## 2018-11-14 LAB — PHOSPHORUS: Phosphorus: 4.1 mg/dL (ref 2.5–4.6)

## 2018-11-14 MED ORDER — TRAVASOL 10 % IV SOLN
INTRAVENOUS | Status: AC
  Administered 2018-11-14: 18:00:00 via INTRAVENOUS
  Filled 2018-11-14: qty 1056

## 2018-11-14 NOTE — Progress Notes (Signed)
PHARMACY - ADULT TOTAL PARENTERAL NUTRITION CONSULT NOTE   Pharmacy Consult for TPN Indication: bowel rest, severe malnutrition PTA  Patient Measurements: Height: 6' 2"  (188 cm) Weight: 125 lb 14.1 oz (57.1 kg) IBW/kg (Calculated) : 82.2 TPN AdjBW (KG): 54 Body mass index is 16.16 kg/m. Usual Weight: unknown as patient is poor historian, but reports significant weight loss over the past year  HPI: 30 yoM with PMH PUD with perforated gastric ulcer s/p repair in 2003, GERD presents with worsening chronic abdominal pain and associated weight loss. Cachectic appearing. CT reveals ascites with concern for SBO vs recurrent ulcer disease. Pt is NPO. Consulted by surgery for TPN management. Pt will require nutritional support prior to any surgical intervention.  Current Nutrition: NPO/chips IVF: NS at 50 mL/hr  Central access: Double lumen PICC placed 2/14 TPN start date: 2/14  Significant events:  2/27 KUB w/ persistent free air, CT scan w/ persistent abd fluid collection plus 2 additional fluid collections RLQ anda deep central pelvis 2/28 to IR for drain exchanges/upsize x 2, place 2 more drains if possible 3/3: per notes, pt not candidate for surgery right now, needs more bowel rest, needs EGD closer to when is surgical candidate, to reevaluate next week- still with free air.   ASSESSMENT                                                                                                Today, 11/14/2018:   Glucose 102 (goal 100-150): no longer checking CBGs d/t excellent control;CBGs stable within goal range  Electrolytes - stable WNL, (Na 134)  Renal - SCr, bicarb, BUN wnl, UOP 2.1L, 35 ml total of 4 drains  Hepatic - Alk Phos continues to rise slowly; still less than 2x ULN; other enzymes stable; albumin remains low. Doubt alk phos is acutely elevated d/t TPN  TGs - WNL on 2/24 and 3/2  Prealbumin - 17.8 (3/2) improving  NUTRITIONAL GOALS                                                                                  RD recs (per note on 2/14): Kcal: 1900 - 2100 grams Protein: 95 - 115 g Fluid: >/= 1.9 L/day  PLAN  At 1800 today:  Continue TPN at goal rate of 80 mL/hr providing 100% of patient needs with 1978 kcal and 106 g protein  Electrolytes: no changes today  No SSI needed  Continue IVF as ordered  TPN lab panels on Mondays & Thursdays  Minda Ditto PharmD Pager [phone] 11/14/2018, 7:22 AM

## 2018-11-14 NOTE — Progress Notes (Signed)
NGT clamped for six hours.  Residual checked at 2100, only about 7 cc found.  NGT pulled out by nurse with no discomfort to patient.

## 2018-11-14 NOTE — Progress Notes (Signed)
Subjective: CC: No complaints No complaints. Patient denies abdominal pain or nausea. He is passing flatus. No BM since Sunday.   Objective: Vital signs in last 24 hours: Temp:  [97.5 F (36.4 C)-98.6 F (37 C)] 97.5 F (36.4 C) (03/04 0446) Pulse Rate:  [81-84] 81 (03/04 0446) Resp:  [18-20] 20 (03/04 0446) BP: (118-131)/(55-76) 118/55 (03/04 0446) SpO2:  [99 %-100 %] 99 % (03/04 0446) Last BM Date: 11/11/18  Intake/Output from previous day: 03/03 0701 - 03/04 0700 In: 3349.7 [P.O.:120; I.V.:2918.9; NG/GT:120; IV Piggyback:150.8] Out: 2200 [Urine:1650; Emesis/NG output:500; Drains:50] Intake/Output this shift: No intake/output data recorded.  PE: Gen: Frail appearing. Alert, NAD, pleasant.  Cardio: RRR Pulm: CTAB, no W/R/R, effort normal Abd: Soft,ND, NT, +BS,4 abdominal drains. NG tube with cloudy yellow fluid. 120cc overnight.  -RUQ -  0 cc overnight. Minimal retained serosanguinous/purulent fluid in JP drain  -RLQ - 25cc overnight. Purulent fluid in JP drain  -LUQ 10cc overnight. Serosanginous drainage in JP drain  -LLQ 15cc overnight. Serosanginous drainage in JP drain Ext:No BLE edema Skin: no rashes noted, warm and dry  Lab Results:  Recent Labs    11/12/18 0416  WBC 10.1  HGB 9.6*  HCT 30.0*  PLT 438*   BMET Recent Labs    11/12/18 0416 11/14/18 0456  NA 135 134*  K 4.0 4.1  CL 103 102  CO2 26 28  GLUCOSE 101* 102*  BUN 20 21  CREATININE 0.70 0.70  CALCIUM 8.6* 8.7*   PT/INR No results for input(s): LABPROT, INR in the last 72 hours. CMP     Component Value Date/Time   NA 134 (L) 11/14/2018 0456   K 4.1 11/14/2018 0456   CL 102 11/14/2018 0456   CO2 28 11/14/2018 0456   GLUCOSE 102 (H) 11/14/2018 0456   BUN 21 11/14/2018 0456   CREATININE 0.70 11/14/2018 0456   CALCIUM 8.7 (L) 11/14/2018 0456   PROT 7.1 11/12/2018 0416   ALBUMIN 2.0 (L) 11/12/2018 0416   AST 28 11/12/2018 0416   ALT 23 11/12/2018 0416   ALKPHOS 215 (H)  11/12/2018 0416   BILITOT 0.3 11/12/2018 0416   GFRNONAA >60 11/14/2018 0456   GFRAA >60 11/14/2018 0456   Lipase     Component Value Date/Time   LIPASE 30 10/25/2018 1721       Studies/Results: No results found.  Anti-infectives: Anti-infectives (From admission, onward)   Start     Dose/Rate Route Frequency Ordered Stop   11/09/18 0600  ceFAZolin (ANCEF) IVPB 2g/100 mL premix     2 g 200 mL/hr over 30 Minutes Intravenous On call 11/08/18 1546 11/09/18 0900   10/26/18 0400  piperacillin-tazobactam (ZOSYN) IVPB 3.375 g     3.375 g 12.5 mL/hr over 240 Minutes Intravenous Every 8 hours 10/26/18 0245     02 /13/20 1930  piperacillin-tazobactam (ZOSYN) IVPB 3.375 g     3.375 g 100 mL/hr over 30 Minutes Intravenous  Once 10/25/18 1928 10/25/18 2021       Assessment/Plan  Hx of perforated gastric ulcer - s/p repair in 2003 Hx of medication non-compliance AKI- improving  Free air with ascites and thickened bowel loops -Benign exam and no signs of sepsis - this is likely subacute - this could be secondary to obstruction vs recurrent ulcer diseasevs perforated gastric cancer? - CA 19-9 elevated - patient is significantly malnourished with temporal wasting - needs TPN and nutritional support prior to any operative intervention - continue PPIBID, TPN  and NPO - UGI study2/19showed no definitive leak, free air still seen - MR abdomen2/21shows gastric thickening - s/p IR drain placement x2. Culture:RARE ERYSIPELOTHRIX RHUSIOPATHIAE - CT scan 2/27 shows decreased free air, 2 persistent abscess with drains in place and 2 new fluid collections - s/p initial IR drain x2 upsized/replaced, 2 new IR drains placed - GI holding off on EGD for now due to persistent free air, hopefully next week - IR plans for CT imaging once output drops under 10cc in each drain (2 drains are above this)  Severe protein calorie malnutrition- continue TPN and NG for now  FEN: NPO, TPN, IVF;  protonixBID. K 4.1, Mg 1.8 VTE: SCDs, sq heparin ID: zosyn 2/13>> Foley: none  Plan: Continue IR drains and abx. Continue NG tube.No further surgical needs or recs until EGD complete. Will follow peripherally until CT scan is done and EGD timeframe can be determined. Per IR note, this will be when each drain <10cc output.    LOS: 20 days    Jacinto Halim , [facility] Surgery 11/14/2018, 8:27 AM Pager: [phone]

## 2018-11-14 NOTE — Progress Notes (Signed)
Referring Physician(s): Gonfa, T.   Supervising Physician: Ruel Favors  Patient Status:  [facility] - In-pt  Chief Complaint: S/p exchange and upsize of bilateral flank drains; addition of two 31F biliary drains on 2/28  Subjective: Patient states he is feeling good and has no current complaints. Patient denies abdominal pain, nausea, fever, vomiting, and chest pain. Patient is having trouble sleeping at night due to drains - says he tends to roll over onto his side and pull/compress on the tubes.   Allergies: Patient has no known allergies.  Medications: Prior to Admission medications   Medication Sig Start Date End Date Taking? Authorizing Provider  Alum & Mag Hydroxide-Simeth (GI COCKTAIL) SUSP suspension Take 30 mLs by mouth 2 (two) times daily. Shake well. 10/10/18  Yes Gwyneth Sprout, MD  omeprazole (PRILOSEC) 20 MG capsule Take 1 capsule (20 mg total) by mouth daily. 10/10/18  Yes Gwyneth Sprout, MD     Vital Signs: BP (!) 118/55 (BP Location: Left Wrist) Comment: Nurse Notified  Pulse 81   Temp (!) 97.5 F (36.4 C) (Oral)   Resp 20   Ht 6\' 2"  (1.88 m)   Wt 125 lb 14.1 oz (57.1 kg)   SpO2 99%   BMI 16.16 kg/m   Physical Exam  Patient is awake and alert, resting comfortably in the bed. Abdomen is soft and non-tender to palpation. Two drains on R side producing purulent fluid, and two drains on the L side producing blood tinged purulent fluid. Minimal fluid in all four JP bulbs. All four drain sites are clean and dry, without erythema or warmth to touch.   Four abdominal drains had combined 50 cc of output in last 24 hours.  Labs are stable  Imaging: No results found.  Labs:  CBC: Recent Labs    11/04/18 0806 11/05/18 0429 11/07/18 0520 11/12/18 0416  WBC 4.5 5.1 6.8 10.1  HGB 9.4* 9.1* 9.6* 9.6*  HCT 28.9* 28.2* 29.2* 30.0*  PLT 303 280 308 438*    COAGS: Recent Labs    10/26/18 0500 11/08/18 1641  INR 1.13 1.0    BMP: Recent Labs   11/07/18 0520 11/08/18 0349 11/12/18 0416 11/14/18 0456  NA 135 136 135 134*  K 3.8 4.1 4.0 4.1  CL 102 102 103 102  CO2 30 29 26 28   GLUCOSE 114* 108* 101* 102*  BUN 18 19 20 21   CALCIUM 8.3* 8.5* 8.6* 8.7*  CREATININE 0.72 0.73 0.70 0.70  GFRNONAA >60 >60 >60 >60  GFRAA >60 >60 >60 >60    LIVER FUNCTION TESTS: Recent Labs    11/05/18 0429 11/08/18 0349 11/11/18 0519 11/12/18 0416  BILITOT 0.3 0.3 0.3 0.3  AST 29 28 27 28   ALT 23 25 23 23   ALKPHOS 152* 178* 206* 215*  PROT 6.4* 7.3 7.2 7.1  ALBUMIN 1.7* 2.0* 1.9* 2.0*    Assessment and Plan: Small bowel obstruction; intra-abdominal abscesses; protein calorie malnutrition Abscess cx - erysipelothrix rhusiopathiae, continue abx Continue with NPO and NG tube for SBO Continue to flush drains and monitor drain output daily; plan is to have repeat CT on Friday 3/6 to determine if removal of drains is appropriate  Electronically Signed: D. Jeananne Rama, PA-C/Maxwell Dennie Bible, PA-S 11/14/2018, 8:46 AM   I spent a total of 15 minutes at the the patient's bedside AND on the patient's hospital floor or unit, greater than 50% of which was counseling/coordinating care for abdominal flank drains.    Patient ID: [name], male  DOB: 01-24-1956, 63 y.o.   MRN: [id number]

## 2018-11-14 NOTE — Progress Notes (Addendum)
PROGRESS NOTE    Vincent Park  NIO:[id number] DOB: 1956/09/08 DOA: 10/25/2018 PCP: Patient, No Pcp Per   Brief Narrative: Patient is a 63 year old [place] male with history of perforated ulcer in 2003 status post repair, GERD, severe protein calorie malnutrition who was initially admitted on 2/13 with complaints of abdominal pain for 2 weeks.  CT abdomen showed dilated thickened small bowel, free air concerning for pneumoperitoneum.  Started on conservative management with NG tube decompression and antibiotics.  GI imaging showed large ulcer without definite leak.  MRI abdomen on 2/21 showed multiple intra-abdominal abscesses, masslike nonspecific wall thickening involving the proximal stomach.  IR consulted and drained the abscesses along with placement of drains.  Gram stain was positive for GPC's.  Culture negative.  General surgery and IR following.  GI was also consulted. Given his severe malnutrition and intra-abdominal pathology, he was started on TPN.  Assessment & Plan:   Principal Problem:   SBO (small bowel obstruction) (HCC) Active Problems:   AKI (acute kidney injury) (HCC)   Cachexia (HCC)   PUD (peptic ulcer disease)   Moderate alcohol consumption   GERD (gastroesophageal reflux disease)   Pneumoperitoneum   Protein-calorie malnutrition, severe (HCC)   Intra-abdominal abscess (HCC)   SBO/pneumoperitoneum/intra-abdominal abscess/ascites: Continue NG tube decompression.  General surgery were  following.  Underwent drain placement for intra-abdominal abscesses.  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  IR following.  IR planning for repeat CT scan in few days for possible planning of  removal of the drains.  Has total of 4 drains. Patient remains hemodynamically stable.  Denies any abdominal pain today. NPO.  Continue TPN.  Continue PPI. Plan is to do EGD at some point for proximal stomach wall thickening as shown in the MRI.Holding until drains  are out.  There was also concern for perforated gastric cancer.  CEA, CA-125 normal.  Elevated CA 19-9  AKI: Resolved  Severe protein calorie moderation: Continue TPN.  Has severe malnutrition.  Muscle wasting.  Reports weight loss of 25 pounds in 1 and half years.  Dietitian following.  Normocytic anemia: Continue monitoring.  Might be associated with malnutrition.    Nutrition Problem: Severe Malnutrition Etiology: chronic illness(PUD with surgery)      DVT prophylaxis: Heparin Independence Code Status: Full Family Communication: None present at the bedside Disposition Plan: Undetermined at this point   Consultants: GI, general surgery, IR  Procedures: Drain placement  Antimicrobials:  Anti-infectives (From admission, onward)   Start     Dose/Rate Route Frequency Ordered Stop   11/09/18 0600  ceFAZolin (ANCEF) IVPB 2g/100 mL premix     2 g 200 mL/hr over 30 Minutes Intravenous On call 11/08/18 1546 11/09/18 0900   10/26/18 0400  piperacillin-tazobactam (ZOSYN) IVPB 3.375 g     3.375 g 12.5 mL/hr over 240 Minutes Intravenous Every 8 hours 10/26/18 0245     10/25/18 1930  piperacillin-tazobactam (ZOSYN) IVPB 3.375 g     3.375 g 100 mL/hr over 30 Minutes Intravenous  Once 10/25/18 1928 10/25/18 2021      Subjective: Patient seen and examined the bedside this morning.  Remains comfortable.  Hemodynamically stable.  Denies any abdomen pain, nausea or vomiting.  Objective: Vitals:   11/13/18 0502 11/13/18 1405 11/13/18 2105 11/14/18 0446  BP: 118/71 131/72 123/76 (!) 118/55  Pulse: 92 84 83 81  Resp: 20 18 20 20   Temp: 99 F (37.2 C) 98.6 F (37 C) 98.3 F (36.8 C) (!) 97.5 F (36.4 C)  TempSrc: Oral Oral Oral Oral  SpO2: 99% 100% 99% 99%  Weight:      Height:        Intake/Output Summary (Last 24 hours) at 11/14/2018 1232 Last data filed at 11/14/2018 [id number] Gross per 24 hour  Intake 2730 ml  Output 2450 ml  Net 280 ml   Filed Weights   10/25/18 2218 10/26/18 1258  11/02/18 1006  Weight: 54 kg 54.8 kg 57.1 kg    Examination:  General exam: Appears calm and comfortable ,Not in distress, cachectic, frail  HEENT:PERRL,Oral mucosa moist, Ear/Nose normal on gross exam Respiratory system: Bilateral equal air entry, normal vesicular breath sounds, no wheezes or crackles  Cardiovascular system: S1 & S2 heard, RRR. No JVD, murmurs, rubs, gallops or clicks. No pedal edema. Gastrointestinal system: Abdomen is nondistended, soft and nontender. No organomegaly or masses felt. Normal bowel sounds heard. 4 drains, dressings around abdominal surgical wounds Central nervous system: Alert and oriented. No focal neurological deficits. Extremities: No edema, no clubbing ,no cyanosis, distal peripheral pulses palpable. Skin: No rashes, lesions or ulcers,no icterus ,no pallor MSK: Wasting of muscles   Data Reviewed: I have personally reviewed following labs and imaging studies  CBC: Recent Labs  Lab 11/12/18 0416  WBC 10.1  NEUTROABS 7.6  HGB 9.6*  HCT 30.0*  MCV 103.4*  PLT 438*   Basic Metabolic Panel: Recent Labs  Lab 11/08/18 0349 11/12/18 0416 11/14/18 0456  NA 136 135 134*  K 4.1 4.0 4.1  CL 102 103 102  CO2 29 26 28   GLUCOSE 108* 101* 102*  BUN 19 20 21   CREATININE 0.73 0.70 0.70  CALCIUM 8.5* 8.6* 8.7*  MG 1.9 1.9 1.8  PHOS 4.3 3.9 4.1   GFR: Estimated Creatinine Clearance: 75.3 mL/min (by C-G formula based on SCr of 0.7 mg/dL). Liver Function Tests: Recent Labs  Lab 11/08/18 0349 11/11/18 0519 11/12/18 0416  AST 28 27 28   ALT 25 23 23   ALKPHOS 178* 206* 215*  BILITOT 0.3 0.3 0.3  PROT 7.3 7.2 7.1  ALBUMIN 2.0* 1.9* 2.0*   No results for input(s): LIPASE, AMYLASE in the last 168 hours. No results for input(s): AMMONIA in the last 168 hours. Coagulation Profile: Recent Labs  Lab 11/08/18 1641  INR 1.0   Cardiac Enzymes: No results for input(s): CKTOTAL, CKMB, CKMBINDEX, TROPONINI in the last 168 hours. BNP (last 3  results) No results for input(s): PROBNP in the last 8760 hours. HbA1C: No results for input(s): HGBA1C in the last 72 hours. CBG: No results for input(s): GLUCAP in the last 168 hours. Lipid Profile: Recent Labs    11/12/18 0416  TRIG 92   Thyroid Function Tests: No results for input(s): TSH, T4TOTAL, FREET4, T3FREE, THYROIDAB in the last 72 hours. Anemia Panel: No results for input(s): VITAMINB12, FOLATE, FERRITIN, TIBC, IRON, RETICCTPCT in the last 72 hours. Sepsis Labs: No results for input(s): PROCALCITON, LATICACIDVEN in the last 168 hours.  No results found for this or any previous visit (from the past 240 hour(s)).       Radiology Studies: No results found.      Scheduled Meds: . heparin injection (subcutaneous)  5,000 Units Subcutaneous Q8H  . pantoprazole (PROTONIX) IV  40 mg Intravenous Q12H  . sodium chloride flush  10-40 mL Intracatheter Q12H  . sodium chloride flush  5 mL Intracatheter Q8H  . sodium chloride flush  5 mL Intracatheter Q8H   Continuous Infusions: . sodium chloride 50 mL/hr at 11/13/18 2122  .  sodium chloride 10 mL/hr at 11/12/18 0600  . piperacillin-tazobactam (ZOSYN)  IV 3.375 g (11/14/18 0514)  . TPN ADULT (ION) 80 mL/hr at 11/13/18 1804  . TPN ADULT (ION)       LOS: 20 days    Time spent:35 mins. More than 50% of that time was spent in counseling and/or coordination of care.      Burnadette Pop, MD [facility] Pager [phone]  If 7PM-7AM, please contact night-coverage [email or web address] Password [facility] 11/14/2018, 12:32 PM

## 2018-11-15 LAB — COMPREHENSIVE METABOLIC PANEL
ALT: 32 U/L (ref 0–44)
AST: 31 U/L (ref 15–41)
Albumin: 2.3 g/dL — ABNORMAL LOW (ref 3.5–5.0)
Alkaline Phosphatase: 252 U/L — ABNORMAL HIGH (ref 38–126)
Anion gap: 6 (ref 5–15)
BUN: 21 mg/dL (ref 8–23)
CHLORIDE: 103 mmol/L (ref 98–111)
CO2: 27 mmol/L (ref 22–32)
CREATININE: 0.71 mg/dL (ref 0.61–1.24)
Calcium: 8.8 mg/dL — ABNORMAL LOW (ref 8.9–10.3)
GFR calc Af Amer: >60 mL/min (ref >60)
GFR calc non Af Amer: >60 mL/min (ref >60)
Glucose, Bld: 97 mg/dL (ref 70–99)
Potassium: 4 mmol/L (ref 3.5–5.1)
Sodium: 136 mmol/L (ref 135–145)
Total Bilirubin: 0.2 mg/dL — ABNORMAL LOW (ref 0.3–1.2)
Total Protein: 7.6 g/dL (ref 6.5–8.1)

## 2018-11-15 LAB — PHOSPHORUS: PHOSPHORUS: 3.9 mg/dL (ref 2.5–4.6)

## 2018-11-15 LAB — MAGNESIUM: Magnesium: 1.9 mg/dL (ref 1.7–2.4)

## 2018-11-15 MED ORDER — BOOST / RESOURCE BREEZE PO LIQD CUSTOM
1.0000 | Freq: Three times a day (TID) | ORAL | Status: DC
  Administered 2018-11-15 – 2018-11-16 (×4): 1 via ORAL
  Administered 2018-11-16: 21:00:00 via ORAL
  Administered 2018-11-17 – 2018-11-20 (×9): 1 via ORAL
  Administered 2018-11-21: 19:00:00 via ORAL
  Administered 2018-11-21 – 2018-11-22 (×3): 1 via ORAL
  Administered 2018-11-22: 19:00:00 via ORAL
  Administered 2018-11-22 – 2018-11-26 (×10): 1 via ORAL

## 2018-11-15 MED ORDER — TRAVASOL 10 % IV SOLN
INTRAVENOUS | Status: AC
  Administered 2018-11-15: 17:00:00 via INTRAVENOUS
  Filled 2018-11-15: qty 1056

## 2018-11-15 MED ORDER — PRO-STAT SUGAR FREE PO LIQD
30.0000 mL | Freq: Two times a day (BID) | ORAL | Status: DC
  Administered 2018-11-15 – 2018-11-21 (×12): 30 mL via ORAL
  Filled 2018-11-15 (×12): qty 30

## 2018-11-15 NOTE — Progress Notes (Signed)
PROGRESS NOTE    Vincent Park  VOP:[id number] DOB: [birth year] DOA: 10/25/2018 PCP: Patient, No Pcp Per   Brief Narrative: Patient is a 63 year old [place] male with history of perforated ulcer in 2003 status post repair, GERD, severe protein calorie malnutrition who was initially admitted on 2/13 with complaints of abdominal pain for 2 weeks.  CT abdomen showed dilated thickened small bowel, free air concerning for pneumoperitoneum.  Started on conservative management with NG tube decompression and antibiotics.  GI imaging showed large ulcer without definite leak.  MRI abdomen on 2/21 showed multiple intra-abdominal abscesses, masslike nonspecific wall thickening involving the proximal stomach.  IR consulted and drained the abscesses along with placement of drains.  Gram stain was positive for GPC's.  Culture negative.  General surgery and IR following.  GI was also consulted. Given his severe malnutrition and intra-abdominal pathology, he was started on TPN.  Assessment & Plan:   Principal Problem:   SBO (small bowel obstruction) (HCC) Active Problems:   AKI (acute kidney injury) (HCC)   Cachexia (HCC)   PUD (peptic ulcer disease)   Moderate alcohol consumption   GERD (gastroesophageal reflux disease)   Pneumoperitoneum   Protein-calorie malnutrition, severe (HCC)   Intra-abdominal abscess (HCC)   SBO/pneumoperitoneum/intra-abdominal abscess/ascites: NG tube out.Started on clears.  General surgery   following.  Underwent drain placement for intra-abdominal abscesses.  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  IR following.  IR planning for repeat CT scan tomorrow for possible planning of  removal of the drains.  Has total of 4 drains. Patient remains hemodynamically stable.  Denies any abdominal pain today. Continue TPN.  Continue PPI. Plan is to do EGD t for proximal stomach wall thickening as shown in the MRI.Waiting until drains are out.  Will let GI  know after the drains are out.  There was also concern for perforated gastric cancer.  CEA, CA-125 normal.  Elevated CA 19-9  AKI: Resolved  Severe protein calorie moderation: Continue TPN.  Has severe malnutrition.  Muscle wasting.  Reports weight loss of 25 pounds in 1 and half years.  Dietitian following.  Normocytic anemia: Continue monitoring.  Might be associated with malnutrition.    Nutrition Problem: Severe Malnutrition Etiology: chronic illness(PUD with surgery)      DVT prophylaxis: Heparin [place] Code Status: Full Family Communication: None present at the bedside Disposition Plan: Home after full work-up   Consultants: GI, general surgery, IR  Procedures: Drain placement  Antimicrobials:  Anti-infectives (From admission, onward)   Start     Dose/Rate Route Frequency Ordered Stop   11/09/18 0600  ceFAZolin (ANCEF) IVPB 2g/100 mL premix     2 g 200 mL/hr over 30 Minutes Intravenous On call 11/08/18 1546 11/09/18 0900   10/26/18 0400  piperacillin-tazobactam (ZOSYN) IVPB 3.375 g     3.375 g 12.5 mL/hr over 240 Minutes Intravenous Every 8 hours 10/26/18 0245     10/25/18 1930  piperacillin-tazobactam (ZOSYN) IVPB 3.375 g     3.375 g 100 mL/hr over 30 Minutes Intravenous  Once 10/25/18 1928 10/25/18 2021      Subjective: Patient seen and examined bedside this morning.  Remains comfortable.  Hemodynamically stable.  Started on clear liquid diet.  Denies any abdominal pain  Objective: Vitals:   11/14/18 1300 11/14/18 2158 11/15/18 0545 11/15/18 1418  BP: 122/76 107/83 132/87 (!) 132/46  Pulse: 80 80 83 83  Resp: 18 16 16 18   Temp: 98.4 F (36.9 C) 98.3 F (36.8 C) 98.3 F (36.8  C) 98 F (36.7 C)  TempSrc: Oral Oral Oral Oral  SpO2: 100% 100% 100% 100%  Weight:      Height:        Intake/Output Summary (Last 24 hours) at 11/15/2018 1437 Last data filed at 11/15/2018 1434 Gross per 24 hour  Intake 1855.07 ml  Output 1865 ml  Net -9.93 ml   Filed Weights     10/25/18 2218 10/26/18 1258 11/02/18 1006  Weight: 54 kg 54.8 kg 57.1 kg    Examination:  General exam: Appears calm and comfortable ,Not in distress,thin/frial HEENT:PERRL,Oral mucosa moist, Ear/Nose normal on gross exam Respiratory system: Bilateral equal air entry, normal vesicular breath sounds, no wheezes or crackles  Cardiovascular system: S1 & S2 heard, RRR. No JVD, murmurs, rubs, gallops or clicks. Gastrointestinal system: Abdomen is nondistended, soft and nontender. 4 drains. Normal bowel sounds heard. Central nervous system: Alert and oriented. No focal neurological deficits. Extremities: No edema, no clubbing ,no cyanosis, distal peripheral pulses palpable. Skin: No rashes, lesions or ulcers,no icterus ,no pallor MSK: Muscle wasting   Data Reviewed: I have personally reviewed following labs and imaging studies  CBC: Recent Labs  Lab 11/12/18 0416  WBC 10.1  NEUTROABS 7.6  HGB 9.6*  HCT 30.0*  MCV 103.4*  PLT 438*   Basic Metabolic Panel: Recent Labs  Lab 11/12/18 0416 11/14/18 0456 11/15/18 0342  NA 135 134* 136  K 4.0 4.1 4.0  CL 103 102 103  CO2 GLUCOSE 101* 102* 97  BUN CREATININE 0.70 0.70 0.71  CALCIUM 8.6* 8.7* 8.8*  MG 1.9 1.8 1.9  PHOS 3.9 4.1 3.9   GFR: Estimated Creatinine Clearance: 75.3 mL/min (by C-G formula based on SCr of 0.71 mg/dL). Liver Function Tests: Recent Labs  Lab 11/11/18 0519 11/12/18 0416 11/15/18 0342  AST ALT 23 23 32  ALKPHOS 206* 215* 252*  BILITOT 0.3 0.3 0.2*  PROT 7.2 7.1 7.6  ALBUMIN 1.9* 2.0* 2.3*   No results for input(s): LIPASE, AMYLASE in the last 168 hours. No results for input(s): AMMONIA in the last 168 hours. Coagulation Profile: Recent Labs  Lab 11/08/18 1641  INR 1.0   Cardiac Enzymes: No results for input(s): CKTOTAL, CKMB, CKMBINDEX, TROPONINI in the last 168 hours. BNP (last 3 results) No results for input(s): PROBNP in the last 8760 hours. HbA1C: No  results for input(s): HGBA1C in the last 72 hours. CBG: No results for input(s): GLUCAP in the last 168 hours. Lipid Profile: No results for input(s): CHOL, HDL, LDLCALC, TRIG, CHOLHDL, LDLDIRECT in the last 72 hours. Thyroid Function Tests: No results for input(s): TSH, T4TOTAL, FREET4, T3FREE, THYROIDAB in the last 72 hours. Anemia Panel: No results for input(s): VITAMINB12, FOLATE, FERRITIN, TIBC, IRON, RETICCTPCT in the last 72 hours. Sepsis Labs: No results for input(s): PROCALCITON, LATICACIDVEN in the last 168 hours.  No results found for this or any previous visit (from the past 240 hour(s)).       Radiology Studies: No results found.      Scheduled Meds: . feeding supplement  1 Container Oral TID BM  . feeding supplement (PRO-STAT SUGAR FREE 64)  30 mL Oral BID  . heparin injection (subcutaneous)  5,000 Units Subcutaneous Q8H  . pantoprazole (PROTONIX) IV  40 mg Intravenous Q12H  . sodium chloride flush  10-40 mL Intracatheter Q12H  . sodium chloride flush  5 mL Intracatheter Q8H  . sodium chloride flush  5  mL Intracatheter Q8H   Continuous Infusions: . sodium chloride 50 mL/hr at 11/15/18 1327  . sodium chloride 500 mL (11/14/18 1328)  . piperacillin-tazobactam (ZOSYN)  IV 3.375 g (11/15/18 1310)  . TPN ADULT (ION) 80 mL/hr at 11/14/18 1736  . TPN ADULT (ION)       LOS: 21 days    Time spent:25 mins. More than 50% of that time was spent in counseling and/or coordination of care.      Burnadette Pop, MD [facility] Pager [phone]  If 7PM-7AM, please contact night-coverage [email or web address] Password [facility] 11/15/2018, 2:37 PM

## 2018-11-15 NOTE — Progress Notes (Signed)
Nutrition Follow-up  DOCUMENTATION CODES:   Underweight, Severe malnutrition in context of chronic illness  INTERVENTION:    Boost Breeze po TID, each supplement provides 250 kcal and 9 grams of protein  30 ml Prostat BID, each supplement provides 100 kcals and 15 grams protein.   TPN per pharmacy   NUTRITION DIAGNOSIS:   Severe Malnutrition related to chronic illness(PUD with surgery) as evidenced by energy intake < or equal to 75% for > or equal to 1 month, severe fat depletion, severe muscle depletion, percent weight loss.  Ongoing  GOAL:   Patient will meet greater than or equal to 90% of their needs  Met with TPN  MONITOR:   PO intake, Supplement acceptance, Weight trends, Labs, Diet advancement, I & O's  REASON FOR ASSESSMENT:   Consult New TPN/TNA  ASSESSMENT:   Patient with PMH significant for GERD, and perforated peptic ulcer disease (possible surgery?). Presents this admission with free air ascites with thicken bowel loops from unclear etiology.    2/14- TPN start 2/21- CT showed large bilateral abd fluid collection, bilateral abd flank drain placement  2/28- IR drain exchange, 2 more placed   3/4- NGT removed   Per surgery, perforation has sealed. Diet advanced to clears this am. Discussed the importance of protein intake for preservation of lean body mass. Pt willing to try Boost and Prostat. Plan for CT tomorrow and EGD next week to determine next steps.   Pt tolerating TPN at goal 80 ml/hr that provides 1978 kcal and 106  g protein. Meets 100% of needs.   A weight has not been obtained since 2/21. Recommend checking daily weights.   Drains: 45 x 24 hrs UOP: 1850 ml x 24 hrs   Medications reviewed.  Labs reviewed: CBGs 103-116  Diet Order:   Diet Order            Diet clear liquid Room service appropriate? Yes; Fluid consistency: Thin  Diet effective now              EDUCATION NEEDS:   Not appropriate for education at this  time  Skin:  Skin Assessment: Reviewed RN Assessment  Last BM:  3/1  Height:   Ht Readings from Last 1 Encounters:  10/25/18 _0  (1.88 m)    Weight:   Wt Readings from Last 1 Encounters:  11/02/18 57.1 kg    Ideal Body Weight:  86.4 kg  BMI:  Body mass index is 16.16 kg/m.  Estimated Nutritional Needs:   Kcal:  1900-2100 grams  Protein:  95-115 grams  Fluid:  >/= 1.9 L/day   Mariana Single RD, LDN Clinical Nutrition Pager # - [phone]

## 2018-11-15 NOTE — Progress Notes (Signed)
PHARMACY - ADULT TOTAL PARENTERAL NUTRITION CONSULT NOTE   Pharmacy Consult for TPN Indication: bowel rest, severe malnutrition PTA  Patient Measurements: Height: 6' 2"  (188 cm) Weight: 125 lb 14.1 oz (57.1 kg) IBW/kg (Calculated) : 82.2 TPN AdjBW (KG): 54 Body mass index is 16.16 kg/m. Usual Weight: unknown as patient is poor historian, but reports significant weight loss over the past year  HPI: 59 yoM with PMH PUD with perforated gastric ulcer s/p repair in 2003, GERD presents with worsening chronic abdominal pain and associated weight loss. Cachectic appearing. CT reveals ascites with concern for SBO vs recurrent ulcer disease. Pt is NPO. Consulted by surgery for TPN management. Pt will require nutritional support prior to any surgical intervention.  Current Nutrition: NPO/chips IVF: NS at 50 mL/hr  Central access: Double lumen PICC placed 2/14 TPN start date: 2/14  Significant events:  2/27 KUB w/ persistent free air, CT scan w/ persistent abd fluid collection plus 2 additional fluid collections RLQ anda deep central pelvis 2/28 to IR for drain exchanges/upsize x 2, place 2 more drains if possible 3/3: per notes, pt not candidate for surgery right now, needs more bowel rest, needs EGD closer to when is surgical candidate, to reevaluate next week- still with free air.  3/4: NG tube removed  ASSESSMENT                                                                                                Today, 11/15/2018:   Glucose 97 (goal 100-150): no longer checking CBGs d/t excellent control  Electrolytes - stable WNL  Renal - SCr, bicarb, BUN wnl, UOP 1.3L, 60 ml total per 4 drains  Hepatic - Alk Phos continues to rise slowly; still less than 2x ULN; other enzymes stable; albumin remains low. Doubt alk phos is acutely elevated d/t TPN  TGs - WNL on 2/24 and 3/2  Prealbumin - 17.8 (3/2) improving  NUTRITIONAL GOALS                                                                                  RD recs (per note on 2/14): Kcal: 1900 - 2100 grams Protein: 95 - 115 g Fluid: >/= 1.9 L/day  PLAN  At 1800 today:  Continue TPN at goal rate of 80 mL/hr providing 100% of patient needs with 1978 kcal and 106 g protein  Electrolytes: no changes today  No SSI needed  Continue IVF as ordered  TPN lab panels on Mondays & Thursdays  Minda Ditto PharmD Pager [phone] 11/15/2018, 9:39 AM

## 2018-11-15 NOTE — Progress Notes (Signed)
Referring Physician(s): Rayburn, Alphonsus Sias  Supervising Physician: Richarda Overlie  Patient Status:  [facility] - In-pt  Chief Complaint: None  Subjective:  Multiple intraabdominal abscesses s/p bilateral abdominal/flank abscess drain placements x4 (x2 on 11/02/2018 by Dr. Miles Costain, x2 on 11/09/2018 by Dr. Archer Asa). Patient awake and alert laying in bed with no complaints at this time. All 4 drain sites c/d/i with drains intact.   Allergies: Patient has no known allergies.  Medications: Prior to Admission medications   Medication Sig Start Date End Date Taking? Authorizing Provider  Alum & Mag Hydroxide-Simeth (GI COCKTAIL) SUSP suspension Take 30 mLs by mouth 2 (two) times daily. Shake well. 10/10/18  Yes Gwyneth Sprout, MD  omeprazole (PRILOSEC) 20 MG capsule Take 1 capsule (20 mg total) by mouth daily. 10/10/18  Yes Gwyneth Sprout, MD     Vital Signs: BP 132/87 (BP Location: Left Wrist)   Pulse 83   Temp 98.3 F (36.8 C) (Oral)   Resp 16   Ht 6\' 2"  (1.88 m)   Wt 125 lb 14.1 oz (57.1 kg)   SpO2 100%   BMI 16.16 kg/m   Physical Exam Vitals signs and nursing note reviewed.  Constitutional:      General: He is not in acute distress.    Appearance: Normal appearance.  Pulmonary:     Effort: Pulmonary effort is normal. No respiratory distress.  Abdominal:     Comments: All 4 drain incision sites without tenderness, erythema, or drainage; two right-sided drains producing purulent fluid while two left-sided drains producing blood tinged purulent fluid; minimal output of all 4 drains (with a total of approximately 25 mL combined).  Skin:    General: Skin is warm and dry.  Neurological:     Mental Status: He is alert and oriented to person, place, and time.  Psychiatric:        Mood and Affect: Mood normal.        Behavior: Behavior normal.        Thought Content: Thought content normal.        Judgment: Judgment normal.     Labs:  CBC: Recent Labs    11/04/18 0806  11/05/18 0429 11/07/18 0520 11/12/18 0416  WBC 4.5 5.1 6.8 10.1  HGB 9.4* 9.1* 9.6* 9.6*  HCT 28.9* 28.2* 29.2* 30.0*  PLT 303 280 308 438*    COAGS: Recent Labs    10/26/18 0500 11/08/18 1641  INR 1.13 1.0    BMP: Recent Labs    11/08/18 0349 11/12/18 0416 11/14/18 0456 11/15/18 0342  NA 136 135 134* 136  K 4.1 4.0 4.1 4.0  CL 102 103 102 103  CO2 29 26 28 27   GLUCOSE 108* 101* 102* 97  BUN 19 20 21 21   CALCIUM 8.5* 8.6* 8.7* 8.8*  CREATININE 0.73 0.70 0.70 0.71  GFRNONAA >60 >60 >60 >60  GFRAA >60 >60 >60 >60    LIVER FUNCTION TESTS: Recent Labs    11/08/18 0349 11/11/18 0519 11/12/18 0416 11/15/18 0342  BILITOT 0.3 0.3 0.3 0.2*  AST 28 27 28 31   ALT 25 23 23  32  ALKPHOS 178* 206* 215* 252*  PROT 7.3 7.2 7.1 7.6  ALBUMIN 2.0* 1.9* 2.0* 2.3*    Assessment and Plan:  Multiple intraabdominal abscesses s/p bilateral abdominal/flank abscess drain placements x4 (x2 on 11/02/2018 by Dr. Miles Costain, x2 on 11/09/2018 by Dr. Archer Asa). All 4 drains stable with minimal output- plan for CT abdomen/pelvis tomorrow to evaluate for possible removal of  drains if appropriate. Continue with Qshift flushes and monitoring of output. IR to follow.   Electronically Signed: Elwin Mocha, PA-C 11/15/2018, 1:01 PM   I spent a total of 25 Minutes at the the patient's bedside AND on the patient's hospital floor or unit, greater than 50% of which was counseling/coordinating care for multiple intraabdominal abscesses s/p drain placement x4.

## 2018-11-15 NOTE — Progress Notes (Addendum)
Subjective: CC: No complaints NG tube removed yesterday. No abdominal pain, N/V. Passing flatus. Formed BM overnight. No melena or hematochezia. No complaints.   Objective: Vital signs in last 24 hours: Temp:  [98.3 F (36.8 C)-98.4 F (36.9 C)] 98.3 F (36.8 C) (03/05 0545) Pulse Rate:  [80-83] 83 (03/05 0545) Resp:  [16-18] 16 (03/05 0545) BP: (107-132)/(76-87) 132/87 (03/05 0545) SpO2:  [100 %] 100 % (03/05 0545) Last BM Date: 11/11/18  Intake/Output from previous day: 03/04 0701 - 03/05 0700 In: 2854.6 [I.V.:2695; IV Piggyback:139.6] Out: 1895 [Urine:1850; Drains:45] Intake/Output this shift: No intake/output data recorded.  PE: ZOX:WRUEA appearing.Alert, NAD, pleasant. Cardio: RRR Pulm: CTAB, no W/R/R, effort normal Abd: Soft,ND, NT, +BS,4 abdominal drains. -RUQ - 5 cc overnight. Blood tinged serosanguinous/purulent fluid in JP drain -RLQ - 20cc overnight. Purulent fluid in JP drain -LUQ 10cc overnight. Blood tinged serosanguinous/purulent fluid in JP drain -LLQ 10cc overnight. Blood tinged serosanguinous/purulent fluid in JP drain Ext:No BLE edema Skin: no rashes noted, warm and dry  Lab Results:  No results for input(s): WBC, HGB, HCT, PLT in the last 72 hours. BMET Recent Labs    11/14/18 0456 11/15/18 0342  NA 134* 136  K 4.1 4.0  CL 102 103  CO2 28 27  GLUCOSE 102* 97  BUN 21 21  CREATININE 0.70 0.71  CALCIUM 8.7* 8.8*   PT/INR No results for input(s): LABPROT, INR in the last 72 hours. CMP     Component Value Date/Time   NA 136 11/15/2018 0342   K 4.0 11/15/2018 0342   CL 103 11/15/2018 0342   CO2 27 11/15/2018 0342   GLUCOSE 97 11/15/2018 0342   BUN 21 11/15/2018 0342   CREATININE 0.71 11/15/2018 0342   CALCIUM 8.8 (L) 11/15/2018 0342   PROT 7.6 11/15/2018 0342   ALBUMIN 2.3 (L) 11/15/2018 0342   AST 31 11/15/2018 0342   ALT 32 11/15/2018 0342   ALKPHOS 252 (H) 11/15/2018 0342   BILITOT 0.2 (L) 11/15/2018 0342   GFRNONAA >60 11/15/2018 0342   GFRAA >60 11/15/2018 0342   Lipase     Component Value Date/Time   LIPASE 30 10/25/2018 1721       Studies/Results: No results found.  Anti-infectives: Anti-infectives (From admission, onward)   Start     Dose/Rate Route Frequency Ordered Stop   11/09/18 0600  ceFAZolin (ANCEF) IVPB 2g/100 mL premix     2 g 200 mL/hr over 30 Minutes Intravenous On call 11/08/18 1546 11/09/18 0900   10/26/18 0400  piperacillin-tazobactam (ZOSYN) IVPB 3.375 g     3.375 g 12.5 mL/hr over 240 Minutes Intravenous Every 8 hours 10/26/18 0245     10/25/18 1930  piperacillin-tazobactam (ZOSYN) IVPB 3.375 g     3.375 g 100 mL/hr over 30 Minutes Intravenous  Once 10/25/18 1928 10/25/18 2021       Assessment/Plan Hx of perforated gastric ulcer - s/p repair in 2003 Hx of medication non-compliance AKI- improving  Free air with ascites and thickened bowel loops -Benign exam and no signs of sepsis - this is likely subacute - this could be secondary to obstruction vs recurrent ulcer diseasevs perforated gastric cancer? - CA 19-9 elevated - patient is significantly malnourished with temporal wasting - needs TPN and nutritional support prior to any operative intervention - continue PPIBID, TPN and NPO - UGI study2/19showed no definitive leak, free air still seen - MR abdomen2/21shows gastric thickening - s/p IR drain placement x2. Culture:RARE ERYSIPELOTHRIX  RHUSIOPATHIAE - CT scan 2/27 shows decreased free air, 2 persistent abscess with drains in place and 2 new fluid collections - s/p initial IR drain x2 upsized/replaced, 2 new IR drains placed - GI holding off on EGD for now due to persistent free air, hopefully next week - IR plans for CT imaging 3/6 if removal of drains is appropriate.    Severe protein calorie malnutrition- continue TPN and NG for now  FEN: Clears, TPN, IVF; protonixBID. K 4.1, Mg 1.8 VTE: SCDs, sq heparin ID: [email or web address]  2/13>> Foley: none  Plan: Continue IR drains and abx. Ng tube removed. Will start on clears.No further surgical needs or recs until EGD complete.Await CT scan so EGD timeframe can be determined.   LOS: 21 days    Vincent Park , [facility] 11/15/2018, 8:53 AM Pager: [phone]

## 2018-11-16 ENCOUNTER — Inpatient Hospital Stay (HOSPITAL_COMMUNITY): Payer: Self-pay

## 2018-11-16 ENCOUNTER — Encounter (HOSPITAL_COMMUNITY): Payer: Self-pay | Admitting: Radiology

## 2018-11-16 MED ORDER — IOHEXOL 300 MG/ML  SOLN
100.0000 mL | Freq: Once | INTRAMUSCULAR | Status: AC | PRN
  Administered 2018-11-16: 100 mL via INTRAVENOUS

## 2018-11-16 MED ORDER — SODIUM CHLORIDE (PF) 0.9 % IJ SOLN
INTRAMUSCULAR | Status: AC
  Filled 2018-11-16: qty 50

## 2018-11-16 MED ORDER — TRAVASOL 10 % IV SOLN
INTRAVENOUS | Status: AC
  Administered 2018-11-16: 18:00:00 via INTRAVENOUS
  Filled 2018-11-16: qty 1056

## 2018-11-16 NOTE — Progress Notes (Signed)
Referring Physician(s): Dr. Alanda Slim  Supervising Physician: Irish Lack  Patient Status:  [facility] - In-pt  Chief Complaint: Pneumoperitoneum, obstruction vs. Recurrent ulcer disease vs. Gastric perforation  Subjective: Resting comfortably in bed.  Taking sips of clears by spoon.  No complaints. Agreeable to drain removal.   Allergies: Patient has no known allergies.  Medications: Prior to Admission medications   Medication Sig Start Date End Date Taking? Authorizing Provider  Alum & Mag Hydroxide-Simeth (GI COCKTAIL) SUSP suspension Take 30 mLs by mouth 2 (two) times daily. Shake well. 10/10/18  Yes Gwyneth Sprout, MD  omeprazole (PRILOSEC) 20 MG capsule Take 1 capsule (20 mg total) by mouth daily. 10/10/18  Yes Gwyneth Sprout, MD     Vital Signs: BP 119/67 (BP Location: Right Arm)   Pulse 97   Temp 98.4 F (36.9 C) (Oral)   Resp 16   Ht 6\' 2"  (1.88 m)   Wt 125 lb 14.1 oz (57.1 kg)   SpO2 99%   BMI 16.16 kg/m   Physical Exam  NAD, resting in bed, severe subcutaneous fat and muscle wasting Abdomen: soft, non-distended.  RUQ drain intact, insertion site c/d/i, output purulent-appearing. RLQ drain intact, insertion site c/d/i, output mostly serous.  LLQ drain intact, insertion site c/d/i, output mostly serous with some blood.  LUQ drain intact, Thal. Insertion site c/d/i, output mostly serous.   Imaging: Ct Abdomen Pelvis W Contrast  Result Date: 11/16/2018 CLINICAL DATA:  Intra-abdominal infection and status post staged placement 4 separate percutaneous peritoneal drainage catheters. These drains all now combined have minimal output. EXAM: CT ABDOMEN AND PELVIS WITH CONTRAST TECHNIQUE: Multidetector CT imaging of the abdomen and pelvis was performed using the standard protocol following bolus administration of intravenous contrast. CONTRAST:  OMNIPAQUE IOHEXOL 300 MG/ML  SOLN COMPARISON:  11/08/2018 FINDINGS: Lower chest: No acute abnormality. Hepatobiliary: No  focal liver abnormality is seen. No gallstones, gallbladder wall thickening, or biliary dilatation. Pancreas: Unremarkable. No pancreatic ductal dilatation or surrounding inflammatory changes. Spleen: Normal in size without focal abnormality. Adrenals/Urinary Tract: Adrenal glands are unremarkable. Kidneys are normal, without renal calculi, focal lesion, or hydronephrosis. Bladder is unremarkable. Stomach/Bowel: Nasogastric tube has been removed. There remains suggestion of some proximal gastric wall thickening without evidence of overt visible ulceration. Some free intraperitoneal air remains in the left upper quadrant/subphrenic region no evidence of bowel obstruction or significant ileus. Vascular/Lymphatic: No significant vascular findings are present. No enlarged abdominal or pelvic lymph nodes. Reproductive: Prostate is unremarkable. Other: Drain 1 posterior to the liver with no residual fluid surrounding the drain. Drain 2 in left lateral pericolic region with no fluid surrounding the drain. Drain 3 in anterior pelvic right lower quadrant with no fluid surrounding the drainage catheter. Drain 4 in left lower quadrant pelvis just superior to the bladder with no residual fluid surrounding the drain. The only undrained fluid collection is in the deep central pelvis measuring approximately 4.2 x 4.6 cm and demonstrating some decrease in size since the prior CT. Musculoskeletal: No acute or significant osseous findings. IMPRESSION: Significant improvement since the prior CT. All 4 drains have now effectively completely drained their respective abscess collections with no further fluid remaining around any of the drains. The only undrained fluid is a deep central pelvic abscess which has decreased in size since the prior CT. Electronically Signed   By: Irish Lack M.D.   On: 11/16/2018 09:33    Labs:  CBC: Recent Labs    11/04/18 0806 11/05/18 0429 11/07/18  0520 11/12/18 0416  WBC 4.5 5.1 6.8 10.1    HGB 9.4* 9.1* 9.6* 9.6*  HCT 28.9* 28.2* 29.2* 30.0*  PLT 303 280 308 438*    COAGS: Recent Labs    10/26/18 0500 11/08/18 1641  INR 1.13 1.0    BMP: Recent Labs    11/08/18 0349 11/12/18 0416 11/14/18 0456 11/15/18 0342  NA 136 135 134* 136  K 4.1 4.0 4.1 4.0  CL 102 103 102 103  CO2 29 26 28 27   GLUCOSE 108* 101* 102* 97  BUN 19 20 21 21   CALCIUM 8.5* 8.6* 8.7* 8.8*  CREATININE 0.73 0.70 0.70 0.71  GFRNONAA >60 >60 >60 >60  GFRAA >60 >60 >60 >60    LIVER FUNCTION TESTS: Recent Labs    11/08/18 0349 11/11/18 0519 11/12/18 0416 11/15/18 0342  BILITOT 0.3 0.3 0.3 0.2*  AST 28 27 28 31   ALT 25 23 23  32  ALKPHOS 178* 206* 215* 252*  PROT 7.3 7.2 7.1 7.6  ALBUMIN 2.0* 1.9* 2.0* 2.3*    Assessment and Plan: Pneumoperitoneum with multiple intraabdominal abscesses s/p bilateral abdominal/flank abscess drain placements x4 (x2 on 11/02/2018 by Dr. Miles Costain, x2 on 11/09/2018 by Dr. Archer Asa). Patient underwent repeat CT scan this AM. Imaging reviewed by Dr. Fredia Sorrow.  Improved noted in all areas. PA to bedside for assessment.  Output slowly in all drains.  Right-sided drain still with purulent-appearing output.   Removed 3 of the 4 drain without complication. One right drain remains.  Follow output.  Continue current care.  Note plans for possible EGD soon.  Electronically Signed: Hoyt Koch, PA 11/16/2018, 3:37 PM   I spent a total of 15 Minutes at the the patient's bedside AND on the patient's hospital floor or unit, greater than 50% of which was counseling/coordinating care for pneumoperitoneum.

## 2018-11-16 NOTE — Progress Notes (Signed)
Subjective: CC: No complaints.  No complaints overnight. No abdominal pain, N/V. Passing gas. BM on Wednesday, formed. Tolerating CLD.   Objective: Vital signs in last 24 hours: Temp:  [98 F (36.7 C)-98.4 F (36.9 C)] 98.3 F (36.8 C) (03/06 0636) Pulse Rate:  [83-94] 89 (03/06 0636) Resp:  [16-18] 16 (03/06 0636) BP: (119-132)/(46-98) 130/98 (03/06 0636) SpO2:  [99 %-100 %] 99 % (03/06 0636) Last BM Date: 11/14/18  Intake/Output from previous day: 03/05 0701 - 03/06 0700 In: 5069.7 [P.O.:780; I.V.:4038.2; IV Piggyback:211.6] Out: 2835 [Urine:2700; Drains:135] Intake/Output this shift: Total I/O In: 480 [P.O.:480] Out: 412 [Urine:400; Drains:12]  PE: IWP:YKDXI appearing.Alert, NAD, pleasant. Cardio: RRR Pulm: CTAB, no W/R/R, effort normal Abd: Soft,ND, NT, +BS,4 abdominal drains. -RUQ -65cc overnight.Blood tinged serosanguinous/purulent fluid in JP drain -RLQ -40ccovernight. Purulent fluid in JP drain -LUQ15cc overnight. Blood tinged serosanguinous/purulent fluid in JP drain -LLQ15cc overnight. Blood tinged serosanguinous/purulent fluid in JP drain Ext:No BLE edema Skin: no rashes noted, warm and dry  Lab Results:  No results for input(s): WBC, HGB, HCT, PLT in the last 72 hours. BMET Recent Labs    11/14/18 0456 11/15/18 0342  NA 134* 136  K 4.1 4.0  CL 102 103  CO2 28 27  GLUCOSE 102* 97  BUN 21 21  CREATININE 0.70 0.71  CALCIUM 8.7* 8.8*   PT/INR No results for input(s): LABPROT, INR in the last 72 hours. CMP     Component Value Date/Time   NA 136 11/15/2018 0342   K 4.0 11/15/2018 0342   CL 103 11/15/2018 0342   CO2 27 11/15/2018 0342   GLUCOSE 97 11/15/2018 0342   BUN 21 11/15/2018 0342   CREATININE 0.71 11/15/2018 0342   CALCIUM 8.8 (L) 11/15/2018 0342   PROT 7.6 11/15/2018 0342   ALBUMIN 2.3 (L) 11/15/2018 0342   AST 31 11/15/2018 0342   ALT 32 11/15/2018 0342   ALKPHOS 252 (H) 11/15/2018 0342   BILITOT 0.2  (L) 11/15/2018 0342   GFRNONAA >60 11/15/2018 0342   GFRAA >60 11/15/2018 0342   Lipase     Component Value Date/Time   LIPASE 30 10/25/2018 1721       Studies/Results: No results found.  Anti-infectives: Anti-infectives (From admission, onward)   Start     Dose/Rate Route Frequency Ordered Stop   11/09/18 0600  ceFAZolin (ANCEF) IVPB 2g/100 mL premix     2 g 200 mL/hr over 30 Minutes Intravenous On call 11/08/18 1546 11/09/18 0900   10/26/18 0400  piperacillin-tazobactam (ZOSYN) IVPB 3.375 g     3.375 g 12.5 mL/hr over 240 Minutes Intravenous Every 8 hours 10/26/18 0245     10/25/18 1930  piperacillin-tazobactam (ZOSYN) IVPB 3.375 g     3.375 g 100 mL/hr over 30 Minutes Intravenous  Once 10/25/18 1928 10/25/18 2021       Assessment/Plan Hx of perforated gastric ulcer - s/p repair in 2003 Hx of medication non-compliance AKI- improving  Free air with ascites and thickened bowel loops -Benign exam and no signs of sepsis - this is likely subacute - this could be secondary to obstruction vs recurrent ulcer diseasevs perforated gastric cancer? - CA 19-9 elevated - patient is significantly malnourished with temporal wasting - needs TPN and nutritional support prior to any operative intervention - continue PPIBID, TPN and NPO - UGI study2/19showed no definitive leak, free air still seen - MR abdomen2/21shows gastric thickening - s/p IR drain placement x2. Culture:RARE ERYSIPELOTHRIX RHUSIOPATHIAE -  CT scan 2/27 shows decreased free air, 2 persistent abscess with drains in place and 2 new fluid collections - s/p initial IR drain x2 upsized/replaced, 2 new IR drains placed - GI holding off on EGD for now due to persistent free air, hopefullynext week - CT imaging today    Severe protein calorie malnutrition- continue TPN and NG for now  FEN: Clears, TPN, IVF; protonixBID. K 4.0, Mg 1.9 on 3/5 VTE: SCDs, sq heparin ID: [email or web address] 2/13>> Foley: none  Plan:  Continue IR drains and abx. Tolerating clears.Follow up on CT today. No further surgical needsor recsuntil EGD complete, likely sometime next week.    LOS: 22 days    Jacinto Halim , [facility] 11/16/2018, 9:28 AM Pager: [phone]

## 2018-11-16 NOTE — Progress Notes (Signed)
PHARMACY - ADULT TOTAL PARENTERAL NUTRITION CONSULT NOTE   Pharmacy Consult for TPN Indication: bowel rest, severe malnutrition PTA  Patient Measurements: Height: 6' 2"  (188 cm) Weight: 125 lb 14.1 oz (57.1 kg) IBW/kg (Calculated) : 82.2 TPN AdjBW (KG): 54 Body mass index is 16.16 kg/m. Usual Weight: unknown as patient is poor historian, but reports significant weight loss over the past year  HPI: 24 yoM with PMH PUD with perforated gastric ulcer s/p repair in 2003, GERD presents with worsening chronic abdominal pain and associated weight loss. Cachectic appearing. CT reveals ascites with concern for SBO vs recurrent ulcer disease. Pt is NPO. Consulted by surgery for TPN management. Pt will require nutritional support prior to any surgical intervention.  Current Nutrition: NPO/chips IVF: NS at 50 mL/hr  Central access: Double lumen PICC placed 2/14 TPN start date: 2/14  Significant events:  2/27 KUB w/ persistent free air, CT scan w/ persistent abd fluid collection plus 2 additional fluid collections RLQ anda deep central pelvis 2/28 to IR for drain exchanges/upsize x 2, place 2 more drains if possible 3/3: per notes, pt not candidate for surgery right now, needs more bowel rest, needs EGD closer to when is surgical candidate, to reevaluate next week- still with free air.  3/4: NG tube removed 3/6: per CCS, patient tolerating CL, to have repeat CT today, EGD likely next week, continue CL diet and TPN for now  ASSESSMENT                                                                                                Today, 11/16/2018: note no labs 3/6, below is based on 3/5 labs  Glucose 97 (goal 100-150): no longer checking CBGs d/t excellent control  Electrolytes - stable WNL  Renal - SCr, bicarb, BUN wnl, UOP 1.3L, 60 ml total per 4 drains  Hepatic - Alk Phos continues to rise slowly; still less than 2x ULN; other enzymes stable; albumin remains low. Doubt alk phos is acutely  elevated d/t TPN  TGs - WNL on 2/24 and 3/2  Prealbumin - 17.8 (3/2) improving  NUTRITIONAL GOALS                                                                                 RD recs (per note on 2/14): Kcal: 1900 - 2100 grams Protein: 95 - 115 g Fluid: >/= 1.9 L/day  PLAN  At 1800 today:  Continue TPN at goal rate of 80 mL/hr providing 100% of patient needs with 1978 kcal and 106 g protein  Electrolytes: no changes today  No SSI needed  Continue IVF as ordered  TPN lab panels on Mondays & Thursdays   Adrian Saran, PharmD, BCPS Pager [phone] 11/16/2018 9:44 AM

## 2018-11-16 NOTE — Progress Notes (Signed)
PROGRESS NOTE    Vincent Park  WNU:[id number] DOB: 1955-10-12 DOA: 10/25/2018 PCP: Patient, No Pcp Per   Brief Narrative: Patient is a 63 year old [place] male with history of perforated ulcer in 2003 status post repair, GERD, severe protein calorie malnutrition who was initially admitted on 2/13 with complaints of abdominal pain for 2 weeks.  CT abdomen showed dilated thickened small bowel, free air concerning for pneumoperitoneum.  Started on conservative management with NG tube decompression and antibiotics.  GI imaging showed large ulcer without definite leak.  MRI abdomen on 2/21 showed multiple intra-abdominal abscesses, masslike nonspecific wall thickening involving the proximal stomach.  IR consulted and drained the abscesses along with placement of drains.  Gram stain was positive for GPC's.  Culture negative.  General surgery and IR following.  GI was also consulted. Given his severe malnutrition and intra-abdominal pathology, he was started on TPN. Plan is to do follow up CT abdomen today.  Assessment & Plan:   Principal Problem:   SBO (small bowel obstruction) (HCC) Active Problems:   AKI (acute kidney injury) (HCC)   Cachexia (HCC)   PUD (peptic ulcer disease)   Moderate alcohol consumption   GERD (gastroesophageal reflux disease)   Pneumoperitoneum   Protein-calorie malnutrition, severe (HCC)   Intra-abdominal abscess (HCC)   SBO/pneumoperitoneum/intra-abdominal abscess/ascites: NG tube out.Started on clears.  General surgery   following.  Underwent drain placement for intra-abdominal abscesses.  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  IR following.  IR planning for repeat CT scan today for possible planning of  removal of the drains.  Has total of 4 drains. Patient remains hemodynamically stable.  Denies any abdominal pain today. Continue TPN.  Continue PPI. Plan is to do EGD  for proximal stomach wall thickening as shown in the MRI/ruling  out malignancy .Waiting until drains are out.  Will let GI know after the drains are out.  There was also concern for perforated gastric cancer.  CEA, CA-125 normal.  Elevated CA 19-9  AKI: Resolved  Severe protein calorie moderation: Continue TPN.  Has severe malnutrition.  Muscle wasting.  Reports weight loss of 25 pounds in 1 and half years.  Dietitian following.  Normocytic anemia: Continue monitoring.  Might be associated with malnutrition.    Nutrition Problem: Severe Malnutrition Etiology: chronic illness(PUD with surgery)      DVT prophylaxis: Heparin [place] Code Status: Full Family Communication: None present at the bedside Disposition Plan: Home after full work-up   Consultants: GI, general surgery, IR  Procedures: Drain placement  Antimicrobials:  Anti-infectives (From admission, onward)   Start     Dose/Rate Route Frequency Ordered Stop   11/09/18 0600  ceFAZolin (ANCEF) IVPB 2g/100 mL premix     2 g 200 mL/hr over 30 Minutes Intravenous On call 11/08/18 1546 11/09/18 0900   10/26/18 0400  piperacillin-tazobactam (ZOSYN) IVPB 3.375 g     3.375 g 12.5 mL/hr over 240 Minutes Intravenous Every 8 hours 10/26/18 0245     10/25/18 1930  piperacillin-tazobactam (ZOSYN) IVPB 3.375 g     3.375 g 100 mL/hr over 30 Minutes Intravenous  Once 10/25/18 1928 10/25/18 2021      Subjective: Patient seen and examined at the bedside this morning.  Remains comfortable.  Denies any abdominal pain.  No nausea or vomiting.  Last bowel movement was on Wednesday.  He has been tolerating clear liquid diet.  Objective: Vitals:   11/15/18 0545 11/15/18 1418 11/15/18 2223 11/16/18 0636  BP: 132/87 (!) 132/46 119/76 Marland Kitchen)  130/98  Pulse: 83 83 94 89  Resp: 16 18 16 16   Temp: 98.3 F (36.8 C) 98 F (36.7 C) 98.4 F (36.9 C) 98.3 F (36.8 C)  TempSrc: Oral Oral Oral Oral  SpO2: 100% 100% 99% 99%  Weight:      Height:        Intake/Output Summary (Last 24 hours) at 11/16/2018  0854 Last data filed at 11/16/2018 0800 Gross per 24 hour  Intake 5309.73 ml  Output 2835 ml  Net 2474.73 ml   Filed Weights   10/25/18 2218 10/26/18 1258 11/02/18 1006  Weight: 54 kg 54.8 kg 57.1 kg    Examination:  General exam: Appears calm and comfortable ,Not in distress,cachetic HEENT:PERRL,Oral mucosa moist, Ear/Nose normal on gross exam Respiratory system: Bilateral equal air entry, normal vesicular breath sounds, no wheezes or crackles  Cardiovascular system: S1 & S2 heard, RRR. No JVD, murmurs, rubs, gallops or clicks. Gastrointestinal system: Abdomen is nondistended, soft and nontender. No organomegaly or masses felt. Normal bowel sounds heard. 4 drains with overlying dressing.  Drains with scant fluid in the bags Central nervous system: Alert and oriented. No focal neurological deficits. Extremities: No edema, no clubbing ,no cyanosis, distal peripheral pulses palpable. Skin: No rashes, lesions or ulcers,no icterus ,no pallor MSK: muscle wasting   Data Reviewed: I have personally reviewed following labs and imaging studies  CBC: Recent Labs  Lab 11/12/18 0416  WBC 10.1  NEUTROABS 7.6  HGB 9.6*  HCT 30.0*  MCV 103.4*  PLT 438*   Basic Metabolic Panel: Recent Labs  Lab 11/12/18 0416 11/14/18 0456 11/15/18 0342  NA 135 134* 136  K 4.0 4.1 4.0  CL 103 102 103  CO2 26 28 27   GLUCOSE 101* 102* 97  BUN 20 21 21   CREATININE 0.70 0.70 0.71  CALCIUM 8.6* 8.7* 8.8*  MG 1.9 1.8 1.9  PHOS 3.9 4.1 3.9   GFR: Estimated Creatinine Clearance: 75.3 mL/min (by C-G formula based on SCr of 0.71 mg/dL). Liver Function Tests: Recent Labs  Lab 11/11/18 0519 11/12/18 0416 11/15/18 0342  AST 27 28 31   ALT 23 23 32  ALKPHOS 206* 215* 252*  BILITOT 0.3 0.3 0.2*  PROT 7.2 7.1 7.6  ALBUMIN 1.9* 2.0* 2.3*   No results for input(s): LIPASE, AMYLASE in the last 168 hours. No results for input(s): AMMONIA in the last 168 hours. Coagulation Profile: No results for  input(s): INR, PROTIME in the last 168 hours. Cardiac Enzymes: No results for input(s): CKTOTAL, CKMB, CKMBINDEX, TROPONINI in the last 168 hours. BNP (last 3 results) No results for input(s): PROBNP in the last 8760 hours. HbA1C: No results for input(s): HGBA1C in the last 72 hours. CBG: No results for input(s): GLUCAP in the last 168 hours. Lipid Profile: No results for input(s): CHOL, HDL, LDLCALC, TRIG, CHOLHDL, LDLDIRECT in the last 72 hours. Thyroid Function Tests: No results for input(s): TSH, T4TOTAL, FREET4, T3FREE, THYROIDAB in the last 72 hours. Anemia Panel: No results for input(s): VITAMINB12, FOLATE, FERRITIN, TIBC, IRON, RETICCTPCT in the last 72 hours. Sepsis Labs: No results for input(s): PROCALCITON, LATICACIDVEN in the last 168 hours.  No results found for this or any previous visit (from the past 240 hour(s)).       Radiology Studies: No results found.      Scheduled Meds: . feeding supplement  1 Container Oral TID BM  . feeding supplement (PRO-STAT SUGAR FREE 64)  30 mL Oral BID  . heparin injection (subcutaneous)  5,000 Units Subcutaneous Q8H  . pantoprazole (PROTONIX) IV  40 mg Intravenous Q12H  . sodium chloride (PF)      . sodium chloride flush  10-40 mL Intracatheter Q12H  . sodium chloride flush  5 mL Intracatheter Q8H  . sodium chloride flush  5 mL Intracatheter Q8H   Continuous Infusions: . sodium chloride 50 mL/hr at 11/16/18 0557  . sodium chloride Stopped (11/16/18 0548)  . piperacillin-tazobactam (ZOSYN)  IV 12.5 mL/hr at 11/16/18 0557  . TPN ADULT (ION) 80 mL/hr at 11/16/18 0557     LOS: 22 days    Time spent:25 mins. More than 50% of that time was spent in counseling and/or coordination of care.      Burnadette Pop, MD [facility] Pager [phone]  If 7PM-7AM, please contact night-coverage [email or web address] Password [facility] 11/16/2018, 8:54 AM

## 2018-11-17 LAB — BASIC METABOLIC PANEL
Anion gap: 5 (ref 5–15)
BUN: 23 mg/dL (ref 8–23)
CO2: 27 mmol/L (ref 22–32)
Calcium: 8.6 mg/dL — ABNORMAL LOW (ref 8.9–10.3)
Chloride: 104 mmol/L (ref 98–111)
Creatinine, Ser: 0.68 mg/dL (ref 0.61–1.24)
GFR calc Af Amer: >60 mL/min (ref >60)
GFR calc non Af Amer: >60 mL/min (ref >60)
GLUCOSE: 106 mg/dL — AB (ref 70–99)
Potassium: 4.1 mmol/L (ref 3.5–5.1)
Sodium: 136 mmol/L (ref 135–145)

## 2018-11-17 LAB — MAGNESIUM: Magnesium: 1.8 mg/dL (ref 1.7–2.4)

## 2018-11-17 LAB — PHOSPHORUS: Phosphorus: 4 mg/dL (ref 2.5–4.6)

## 2018-11-17 MED ORDER — TRAVASOL 10 % IV SOLN
INTRAVENOUS | Status: AC
  Administered 2018-11-17: 18:00:00 via INTRAVENOUS
  Filled 2018-11-17: qty 1056

## 2018-11-17 NOTE — Progress Notes (Signed)
(  no charge--patient not seen)  I was called today by Dr. Renford Dills to discuss when we would be doing upper endoscopy in this patient.  Our service was previously involved with this patient with respect to timing of endoscopic evaluation.    Our most recent conversation with the surgeon, 5 days ago, indicated that the patient needed more time for overall clinical improvement prior to surgery, and it was felt that given his history of free air and microperforation, it was most prudent to defer endoscopic evaluation, which includes instrumentation of the stomach and insufflation of gas (which might reopen a site of perforation that was tenuously sealed over) until such time as the surgeons were ready to operate.  The most recent surgical note, from yesterday, suggests endoscopic evaluation next week.  We will tentatively plan for Tuesday, which will give our hospital GI physician, and the surgeon of the week, a chance to discuss the case on Monday and make a final decision.  In the meantime, if you have any questions or would like to discuss the case further, please feel free to call me.  Vincent Park, M.D. Pager [phone] If no answer or after 5 PM call [phone]

## 2018-11-17 NOTE — Progress Notes (Addendum)
PROGRESS NOTE    Vincent Park  QQV:[id number] DOB: Sep 23, 1955 DOA: 10/25/2018 PCP: Patient, No Pcp Per   Brief Narrative: Patient is a 63 year old [place] male with history of perforated ulcer in 2003 status post repair, GERD, severe protein calorie malnutrition who was initially admitted on 2/13 with complaints of abdominal pain for 2 weeks.  CT abdomen showed dilated thickened small bowel, free air concerning for pneumoperitoneum.  Started on conservative management with NG tube decompression and antibiotics.  GI imaging showed large ulcer without definite leak.  MRI abdomen on 2/21 showed multiple intra-abdominal abscesses, masslike nonspecific wall thickening involving the proximal stomach.  IR consulted and drained the abscesses along with placement of drains.  Gram stain was positive for GPC's.  Culture negative.  General surgery and IR following.  GI was also consulted. Given his severe malnutrition and intra-abdominal pathology, he was started on TPN.  Repeat CT done on 11/16/2018 showed improvement in the intra-abdominal abscesses.  3 drains removed and 1 right-sided drain still in place.  GI aware and planning for EGD on Tuesday.  Assessment & Plan:   Principal Problem:   SBO (small bowel obstruction) (HCC) Active Problems:   AKI (acute kidney injury) (HCC)   Cachexia (HCC)   PUD (peptic ulcer disease)   Moderate alcohol consumption   GERD (gastroesophageal reflux disease)   Pneumoperitoneum   Protein-calorie malnutrition, severe (HCC)   Intra-abdominal abscess (HCC)   SBO/pneumoperitoneum/intra-abdominal abscess/ascites: NG tube out.Currently on clears and he is tolerating.  General surgery were  following.  Underwent drain placement for intra-abdominal abscesses.  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 IR following.   Patient remains hemodynamically stable.  Denies any abdominal pain today. Continue TPN.  Continue PPI. Plan is to  do EGD  for proximal stomach wall thickening as shown in the MRI/ruling out malignancy .There was also concern for perforated gastric cancer.  CEA, CA-125 normal.  Elevated CA 19-9 Repeat CT done on 11/16/2018 showed improvement in the intra-abdominal abscesses.  3 drains removed and 1 right-sided drain still in place.  GI aware and planning for EGD on Tuesday.  AKI: Resolved  Severe protein calorie moderation: Continue TPN.  Has severe malnutrition.  Muscle wasting.  Reports weight loss of 25 pounds in 1 and half years.  Dietitian following.  Normocytic anemia: Continue monitoring.  Might be associated with malnutrition.    Nutrition Problem: Severe Malnutrition Etiology: chronic illness(PUD with surgery)      DVT prophylaxis: Heparin [place] Code Status: Full Family Communication: None present at the bedside Disposition Plan: Likely Home after full work-up.Needs PT evaluation when off TPN,tolerating diet and completion of full work-up.   Consultants: GI, general surgery, IR  Procedures: Drain placement  Antimicrobials:  Anti-infectives (From admission, onward)   Start     Dose/Rate Route Frequency Ordered Stop   11/09/18 0600  ceFAZolin (ANCEF) IVPB 2g/100 mL premix     2 g 200 mL/hr over 30 Minutes Intravenous On call 11/08/18 1546 11/09/18 0900   10/26/18 0400  piperacillin-tazobactam (ZOSYN) IVPB 3.375 g     3.375 g 12.5 mL/hr over 240 Minutes Intravenous Every 8 hours 10/26/18 0245     10/25/18 1930  piperacillin-tazobactam (ZOSYN) IVPB 3.375 g     3.375 g 100 mL/hr over 30 Minutes Intravenous  Once 10/25/18 1928 10/25/18 2021      Subjective: Patient seen and examined the bedside this morning.  Remains comfortable.  Hemodynamically stable.  Denies any abdominal pain, nausea or vomiting.  Has  not had a bowel movement since last 2 days.  Has been passing gas, tolerating diet. Objective: Vitals:   11/16/18 1334 11/16/18 2157 11/17/18 0530 11/17/18 1329  BP: 119/67 125/72  115/63 117/77  Pulse: 97 88 (!) 102 100  Resp: 16 14 16 18   Temp: 98.4 F (36.9 C) 98.3 F (36.8 C) 99.1 F (37.3 C) 98 F (36.7 C)  TempSrc: Oral Oral Oral Oral  SpO2: 99% 100% 100% 100%  Weight:      Height:        Intake/Output Summary (Last 24 hours) at 11/17/2018 1345 Last data filed at 11/17/2018 1307 Gross per 24 hour  Intake 4589.32 ml  Output 3413 ml  Net 1176.32 ml   Filed Weights   10/25/18 2218 10/26/18 1258 11/02/18 1006  Weight: 54 kg 54.8 kg 57.1 kg    Examination:  General exam: Appears calm and comfortable ,Not in distress,cachetic HEENT:PERRL,Oral mucosa moist, Ear/Nose normal on gross exam Respiratory system: Bilateral equal air entry, normal vesicular breath sounds, no wheezes or crackles  Cardiovascular system: S1 & S2 heard, RRR. No JVD, murmurs, rubs, gallops or clicks. Gastrointestinal system: Abdomen is nondistended, soft and nontender. No organomegaly or masses felt. Normal bowel sounds heard. Right-sided drain with overlying dressing.  Drain bag has purulent thick fluid. Central nervous system: Alert and oriented. No focal neurological deficits. Extremities: No edema, no clubbing ,no cyanosis, distal peripheral pulses palpable. Skin: No rashes, lesions or ulcers,no icterus ,no pallor MSK: Muscle wasting   Data Reviewed: I have personally reviewed following labs and imaging studies  CBC: Recent Labs  Lab 11/12/18 0416  WBC 10.1  NEUTROABS 7.6  HGB 9.6*  HCT 30.0*  MCV 103.4*  PLT 438*   Basic Metabolic Panel: Recent Labs  Lab 11/12/18 0416 11/14/18 0456 11/15/18 0342 11/17/18 0354  NA 135 134* 136 136  K 4.0 4.1 4.0 4.1  CL 103 102 103 104  CO2 26 28 27 27   GLUCOSE 101* 102* 97 106*  BUN 20 21 21 23   CREATININE 0.70 0.70 0.71 0.68  CALCIUM 8.6* 8.7* 8.8* 8.6*  MG 1.9 1.8 1.9 1.8  PHOS 3.9 4.1 3.9 4.0   GFR: Estimated Creatinine Clearance: 75.3 mL/min (by C-G formula based on SCr of 0.68 mg/dL). Liver Function  Tests: Recent Labs  Lab 11/11/18 0519 11/12/18 0416 11/15/18 0342  AST 27 28 31   ALT 23 23 32  ALKPHOS 206* 215* 252*  BILITOT 0.3 0.3 0.2*  PROT 7.2 7.1 7.6  ALBUMIN 1.9* 2.0* 2.3*   No results for input(s): LIPASE, AMYLASE in the last 168 hours. No results for input(s): AMMONIA in the last 168 hours. Coagulation Profile: No results for input(s): INR, PROTIME in the last 168 hours. Cardiac Enzymes: No results for input(s): CKTOTAL, CKMB, CKMBINDEX, TROPONINI in the last 168 hours. BNP (last 3 results) No results for input(s): PROBNP in the last 8760 hours. HbA1C: No results for input(s): HGBA1C in the last 72 hours. CBG: No results for input(s): GLUCAP in the last 168 hours. Lipid Profile: No results for input(s): CHOL, HDL, LDLCALC, TRIG, CHOLHDL, LDLDIRECT in the last 72 hours. Thyroid Function Tests: No results for input(s): TSH, T4TOTAL, FREET4, T3FREE, THYROIDAB in the last 72 hours. Anemia Panel: No results for input(s): VITAMINB12, FOLATE, FERRITIN, TIBC, IRON, RETICCTPCT in the last 72 hours. Sepsis Labs: No results for input(s): PROCALCITON, LATICACIDVEN in the last 168 hours.  No results found for this or any previous visit (from the past 240 hour(s)).  Radiology Studies: Ct Abdomen Pelvis W Contrast  Result Date: 11/16/2018 CLINICAL DATA:  Intra-abdominal infection and status post staged placement 4 separate percutaneous peritoneal drainage catheters. These drains all now combined have minimal output. EXAM: CT ABDOMEN AND PELVIS WITH CONTRAST TECHNIQUE: Multidetector CT imaging of the abdomen and pelvis was performed using the standard protocol following bolus administration of intravenous contrast. CONTRAST:  OMNIPAQUE IOHEXOL 300 MG/ML  SOLN COMPARISON:  11/08/2018 FINDINGS: Lower chest: No acute abnormality. Hepatobiliary: No focal liver abnormality is seen. No gallstones, gallbladder wall thickening, or biliary dilatation. Pancreas: Unremarkable.  No pancreatic ductal dilatation or surrounding inflammatory changes. Spleen: Normal in size without focal abnormality. Adrenals/Urinary Tract: Adrenal glands are unremarkable. Kidneys are normal, without renal calculi, focal lesion, or hydronephrosis. Bladder is unremarkable. Stomach/Bowel: Nasogastric tube has been removed. There remains suggestion of some proximal gastric wall thickening without evidence of overt visible ulceration. Some free intraperitoneal air remains in the left upper quadrant/subphrenic region no evidence of bowel obstruction or significant ileus. Vascular/Lymphatic: No significant vascular findings are present. No enlarged abdominal or pelvic lymph nodes. Reproductive: Prostate is unremarkable. Other: Drain 1 posterior to the liver with no residual fluid surrounding the drain. Drain 2 in left lateral pericolic region with no fluid surrounding the drain. Drain 3 in anterior pelvic right lower quadrant with no fluid surrounding the drainage catheter. Drain 4 in left lower quadrant pelvis just superior to the bladder with no residual fluid surrounding the drain. The only undrained fluid collection is in the deep central pelvis measuring approximately 4.2 x 4.6 cm and demonstrating some decrease in size since the prior CT. Musculoskeletal: No acute or significant osseous findings. IMPRESSION: Significant improvement since the prior CT. All 4 drains have now effectively completely drained their respective abscess collections with no further fluid remaining around any of the drains. The only undrained fluid is a deep central pelvic abscess which has decreased in size since the prior CT. Electronically Signed   By: Irish Lack M.D.   On: 11/16/2018 09:33        Scheduled Meds: . feeding supplement  1 Container Oral TID BM  . feeding supplement (PRO-STAT SUGAR FREE 64)  30 mL Oral BID  . heparin injection (subcutaneous)  5,000 Units Subcutaneous Q8H  . pantoprazole (PROTONIX) IV  40 mg  Intravenous Q12H  . sodium chloride flush  10-40 mL Intracatheter Q12H  . sodium chloride flush  5 mL Intracatheter Q8H  . sodium chloride flush  5 mL Intracatheter Q8H   Continuous Infusions: . sodium chloride 50 mL/hr at 11/17/18 0604  . sodium chloride Stopped (11/16/18 0548)  . piperacillin-tazobactam (ZOSYN)  IV 3.375 g (11/17/18 0554)  . TPN ADULT (ION) 80 mL/hr at 11/16/18 1730  . TPN ADULT (ION)       LOS: 23 days    Time spent:25 mins. More than 50% of that time was spent in counseling and/or coordination of care.      Burnadette Pop, MD [facility] Pager [phone]  If 7PM-7AM, please contact night-coverage [email or web address] Password [email or web address] 11/17/2018, 1:45 PM

## 2018-11-17 NOTE — Progress Notes (Signed)
PHARMACY - ADULT TOTAL PARENTERAL NUTRITION CONSULT NOTE   Pharmacy Consult for TPN Indication: bowel rest, severe malnutrition PTA  Patient Measurements: Height: _0  (188 cm) Weight: 125 lb 14.1 oz (57.1 kg) IBW/kg (Calculated) : 82.2 TPN AdjBW (KG): 54 Body mass index is 16.16 kg/m. Usual Weight: unknown as patient is poor historian, but reports significant weight loss over the past year  HPI: 54 yoM with PMH PUD with perforated gastric ulcer s/p repair in 2003, GERD presents with worsening chronic abdominal pain and associated weight loss. Cachectic appearing. CT reveals ascites with concern for SBO vs recurrent ulcer disease. Pt is NPO. Consulted by surgery for TPN management. Pt will require nutritional support prior to any surgical intervention.  Current Nutrition: NPO/chips IVF: NS at 50 mL/hr  Central access: Double lumen PICC placed 2/14 TPN start date: 2/14  Significant events:  2/27 KUB w/ persistent free air, CT scan w/ persistent abd fluid collection plus 2 additional fluid collections RLQ anda deep central pelvis 2/28 to IR for drain exchanges/upsize x 2, place 2 more drains if possible 3/3: per notes, pt not candidate for surgery right now, needs more bowel rest, needs EGD closer to when is surgical candidate, to reevaluate next week- still with free air.  3/4: NG tube removed 3/6: per CCS, patient tolerating CL, to have repeat CT today, EGD likely next week, continue CL diet and TPN for now  ASSESSMENT                                                                                                Today, 11/17/2018:   Glucose 97 (goal 100-150): no longer checking CBGs d/t excellent control  Electrolytes - stable WNL  Renal - SCr, bicarb, BUN wnl, UOP 3.45L, 20 ml total per 4 drains  Hepatic - Alk Phos continues to rise slowly; still less than 2x ULN; other enzymes stable; albumin remains low. Doubt alk phos is acutely elevated d/t TPN  TGs - WNL on 2/24 and  3/2  Prealbumin - 17.8 (3/2) improving  NUTRITIONAL GOALS                                                                                 RD recs (per note on 2/14): Kcal: 1900 - 2100 grams Protein: 95 - 115 g Fluid: >/= 1.9 L/day  PLAN  At 1800 today:  Continue TPN at goal rate of 80 mL/hr providing 100% of patient needs with 1978 kcal and 106 g protein  Electrolytes: no changes today  No SSI needed  Continue IVF as ordered  TPN lab panels on Mondays & Thursdays   Ulice Dash, PharmD Clinical Pharmacist Pager # [phone]  11/17/2018 1:48 PM

## 2018-11-18 MED ORDER — TRAVASOL 10 % IV SOLN
INTRAVENOUS | Status: AC
  Administered 2018-11-18: 19:00:00 via INTRAVENOUS
  Filled 2018-11-18: qty 1056

## 2018-11-18 NOTE — Progress Notes (Signed)
PROGRESS NOTE    Vincent Park  [id number]:[id number] DOB: Sep 09, 1956 DOA: 10/25/2018 PCP: Patient, No Pcp Per    Brief Narrative:   63 year old male who presented with abdominal pain.  Does have significant past medical history for perforated ulcer disease 2003 status post surgical intervention, he also has GERD.  Reported progressive worsening epigastric abdominal pain associated with vomiting.  His blood pressure was 138/62, heart rate 86, respirate 18, temperature 97.5 F, oxygen saturation 100%, his lungs were clear to auscultation bilaterally, heart S1-S2 present and rhythmic, abdomen with positive bowel sounds, no tenderness, no lower extremity edema.  CT of the abdomen and pelvis with a high-grade small bowel obstruction, significant ascites, free intraperitoneal air and peritoneal enhancement.  Patient was admitted to the hospital with working diagnosis of perforated bowel, pneumoperitoneum with unclear etiology.  Further work-up February 21 with abdominal MRI showed multiple intra-abdominal abscesses, masslike nonspecific wall thickening involving the proximal stomach.  Interventional radiology was consulted, and the drain was placed at the site of the abscess.  Patient has been placed on TPN, follow-up CT March 6 improvement of intra-abdominal abscesses.  Assessment & Plan:   Principal Problem:   SBO (small bowel obstruction) (HCC) Active Problems:   AKI (acute kidney injury) (HCC)   Cachexia (HCC)   PUD (peptic ulcer disease)   Moderate alcohol consumption   GERD (gastroesophageal reflux disease)   Pneumoperitoneum   Protein-calorie malnutrition, severe (HCC)   Intra-abdominal abscess (HCC)   1. Intra-abdominal abscess (present on admission). Sp drainage, will continue antibiotic therapy with IV Zosyn #23, patient has remained afebrile with no significant leukocytosis, may consider stopping antibiotic therapy.  Last imaging CT with improvement of abscesses, 3 drains have been  removed, one still in place.   2. Small bowel obstruction. Patient tolerating clears, will continue nutritional support with TPN for now.   3. Hx of peptic ulcer disease. Plan for possible diagnostic endoscopy on 3.10.20. Will continue antiacid therapy with Pantoprazole and will will follow with GI recommendations.   4. Severe protein calorie malnutrition. Will continue nutritional supplements.   5. Alcohol abuse. No clinical signs of withdrawal, will continue neuro checks per unit protocol.   6. AKI. Clinically has resolved. Renal function with serum cr at 0,68 yesterday.   7. Anemia of chronic disease. Stable hb and hct. No indication for prbc transfusion today.   DVT prophylaxis: heparin   Code Status: full Family Communication: no family at the bedside  Disposition Plan/ discharge barriers: pending clinical improvement.   Body mass index is 16.16 kg/m. Malnutrition Type:  Nutrition Problem: Severe Malnutrition Etiology: chronic illness(PUD with surgery)   Malnutrition Characteristics:  Signs/Symptoms: energy intake < or equal to 75% for > or equal to 1 month, severe fat depletion, severe muscle depletion, percent weight loss   Nutrition Interventions:  Interventions: TPN  RN Pressure Injury Documentation:     Consultants:   Surgery   IR  GI   Procedures:   Intra-abdominal drains placed  Antimicrobials:    IV Zosyn.    Subjective: Patient feeling well this, continue to be very weak and deconditioned, reported that has been ambulating and tolerating po intake, with no nausea or vomiting.   Objective: Vitals:   11/17/18 0530 11/17/18 1329 11/17/18 2138 11/18/18 0542  BP: 115/63 117/77 108/63 121/62  Pulse: (!) 102 100 85 93  Resp: Temp: 99.1 F (37.3 C) 98 F (36.7 C) 98.1 F (36.7 C) 98.7 F (37.1 C)  TempSrc:  Oral Oral Oral Oral  SpO2: 100% 100% 100% 100%  Weight:      Height:        Intake/Output Summary (Last 24 hours) at  11/18/2018 1246 Last data filed at 11/18/2018 1046 Gross per 24 hour  Intake 2630 ml  Output 3170 ml  Net -540 ml   Filed Weights   10/25/18 2218 10/26/18 1258 11/02/18 1006  Weight: 54 kg 54.8 kg 57.1 kg    Examination:   General: deconditioned and ill looking appearing  Neurology: Awake and alert, non focal  E ENT: positive pallor, no icterus, oral mucosa moist Cardiovascular: No JVD. S1-S2 present, rhythmic, no gallops, rubs, or murmurs. No lower extremity edema. Pulmonary: positive breath sounds bilaterally, adequate air movement, no wheezing, rhonchi or rales. Gastrointestinal. Abdomen with no organomegaly, non tender, no rebound or guarding Skin. No rashes Musculoskeletal: no joint deformities Right lower abdomen drain in place, the left one has been removed.     Data Reviewed: I have personally reviewed following labs and imaging studies  CBC: Recent Labs  Lab 11/12/18 0416  WBC 10.1  NEUTROABS 7.6  HGB 9.6*  HCT 30.0*  MCV 103.4*  PLT 438*   Basic Metabolic Panel: Recent Labs  Lab 11/12/18 0416 11/14/18 0456 11/15/18 0342 11/17/18 0354  NA 135 134* 136 136  K 4.0 4.1 4.0 4.1  CL 103 102 103 104  CO2 26 28 27 27   GLUCOSE 101* 102* 97 106*  BUN 20 21 21 23   CREATININE 0.70 0.70 0.71 0.68  CALCIUM 8.6* 8.7* 8.8* 8.6*  MG 1.9 1.8 1.9 1.8  PHOS 3.9 4.1 3.9 4.0   GFR: Estimated Creatinine Clearance: 75.3 mL/min (by C-G formula based on SCr of 0.68 mg/dL). Liver Function Tests: Recent Labs  Lab 11/12/18 0416 11/15/18 0342  AST 28 31  ALT 23 32  ALKPHOS 215* 252*  BILITOT 0.3 0.2*  PROT 7.1 7.6  ALBUMIN 2.0* 2.3*   No results for input(s): LIPASE, AMYLASE in the last 168 hours. No results for input(s): AMMONIA in the last 168 hours. Coagulation Profile: No results for input(s): INR, PROTIME in the last 168 hours. Cardiac Enzymes: No results for input(s): CKTOTAL, CKMB, CKMBINDEX, TROPONINI in the last 168 hours. BNP (last 3 results) No results  for input(s): PROBNP in the last 8760 hours. HbA1C: No results for input(s): HGBA1C in the last 72 hours. CBG: No results for input(s): GLUCAP in the last 168 hours. Lipid Profile: No results for input(s): CHOL, HDL, LDLCALC, TRIG, CHOLHDL, LDLDIRECT in the last 72 hours. Thyroid Function Tests: No results for input(s): TSH, T4TOTAL, FREET4, T3FREE, THYROIDAB in the last 72 hours. Anemia Panel: No results for input(s): VITAMINB12, FOLATE, FERRITIN, TIBC, IRON, RETICCTPCT in the last 72 hours.    Radiology Studies: I have reviewed all of the imaging during this hospital visit personally     Scheduled Meds: . feeding supplement  1 Container Oral TID BM  . feeding supplement (PRO-STAT SUGAR FREE 64)  30 mL Oral BID  . heparin injection (subcutaneous)  5,000 Units Subcutaneous Q8H  . pantoprazole (PROTONIX) IV  40 mg Intravenous Q12H  . sodium chloride flush  10-40 mL Intracatheter Q12H  . sodium chloride flush  5 mL Intracatheter Q8H  . sodium chloride flush  5 mL Intracatheter Q8H   Continuous Infusions: . sodium chloride 50 mL/hr at 11/18/18 0300  . sodium chloride Stopped (11/16/18 0548)  . piperacillin-tazobactam (ZOSYN)  IV 3.375 g (11/18/18 0514)  . TPN ADULT (  ION) 80 mL/hr at 11/18/18 0300  . TPN ADULT (ION)       LOS: 24 days         Annett Gula, MD

## 2018-11-18 NOTE — Progress Notes (Addendum)
PHARMACY - ADULT TOTAL PARENTERAL NUTRITION CONSULT NOTE   Pharmacy Consult for TPN Indication: bowel rest, severe malnutrition PTA  Patient Measurements: Height: 6' 2"  (188 cm) Weight: 125 lb 14.1 oz (57.1 kg) IBW/kg (Calculated) : 82.2 TPN AdjBW (KG): 54 Body mass index is 16.16 kg/m. Usual Weight: unknown as patient is poor historian, but reports significant weight loss over the past year  HPI: 62 yoM with PMH PUD with perforated gastric ulcer s/p repair in 2003, GERD presents with worsening chronic abdominal pain and associated weight loss. Cachectic appearing. CT reveals ascites with concern for SBO vs recurrent ulcer disease. Pt is NPO. Consulted by surgery for TPN management. Pt will require nutritional support prior to any surgical intervention.  Current Nutrition: NPO/chips IVF: NS at 50 mL/hr  Central access: Double lumen PICC placed 2/14 TPN start date: 2/14  Significant events:  2/27 KUB w/ persistent free air, CT scan w/ persistent abd fluid collection plus 2 additional fluid collections RLQ anda deep central pelvis 2/28 to IR for drain exchanges/upsize x 2, place 2 more drains if possible 3/3: per notes, pt not candidate for surgery right now, needs more bowel rest, needs EGD closer to when is surgical candidate, to reevaluate next week- still with free air.  3/4: NG tube removed 3/5: started CLD, supplements per dietary 3/6: per CCS, patient tolerating CL, to have repeat CT today, EGD likely next week, continue CL diet and TPN for now 3/8: surgery and GI to discuss case on Monday and determine plans for proceeding with surgery/EGD  ASSESSMENT                                                                                                Today, 11/18/2018:   Glucose 106 (goal 100-150): no longer checking CBGs d/t excellent control  Electrolytes - stable WNL  Renal - SCr, bicarb, BUN wnl, UOP 3L, 25 ml total per 1 drain  Hepatic - Alk Phos (3/5) continues to rise  slowly; still less than 2x ULN; other enzymes stable; albumin remains low. Doubt alk phos is acutely elevated d/t TPN  TGs - WNL on 2/24 and 3/2  Prealbumin - 17.8 (3/2) improving  NUTRITIONAL GOALS                                                                                 RD recs (per note on 2/14): Kcal: 1900 - 2100 grams Protein: 95 - 115 g Fluid: >/= 1.9 L/day  PLAN  At 1800 today:  Continue TPN at goal rate of 80 mL/hr providing 100% of patient needs with 1978 kcal and 106 g protein  Electrolytes: no changes today  No SSI needed  Continue IVF as ordered  TPN lab panels on Mondays & Thursdays   Ulice Dash, PharmD Clinical Pharmacist Pager # [phone]  11/18/2018 7:44 AM

## 2018-11-19 LAB — COMPREHENSIVE METABOLIC PANEL
ALT: 53 U/L — ABNORMAL HIGH (ref 0–44)
AST: 49 U/L — ABNORMAL HIGH (ref 15–41)
Albumin: 2.5 g/dL — ABNORMAL LOW (ref 3.5–5.0)
Alkaline Phosphatase: 305 U/L — ABNORMAL HIGH (ref 38–126)
Anion gap: 6 (ref 5–15)
BUN: 21 mg/dL (ref 8–23)
CO2: 27 mmol/L (ref 22–32)
Calcium: 8.9 mg/dL (ref 8.9–10.3)
Chloride: 102 mmol/L (ref 98–111)
Creatinine, Ser: 0.7 mg/dL (ref 0.61–1.24)
GFR calc Af Amer: >60 mL/min (ref >60)
GFR calc non Af Amer: >60 mL/min (ref >60)
Glucose, Bld: 100 mg/dL — ABNORMAL HIGH (ref 70–99)
Potassium: 4.1 mmol/L (ref 3.5–5.1)
SODIUM: 135 mmol/L (ref 135–145)
Total Bilirubin: 0.4 mg/dL (ref 0.3–1.2)
Total Protein: 7.7 g/dL (ref 6.5–8.1)

## 2018-11-19 LAB — CBC
HCT: 30.6 % — ABNORMAL LOW (ref 39.0–52.0)
Hemoglobin: 9.6 g/dL — ABNORMAL LOW (ref 13.0–17.0)
MCH: 32.9 pg (ref 26.0–34.0)
MCHC: 31.4 g/dL (ref 30.0–36.0)
MCV: 104.8 fL — ABNORMAL HIGH (ref 80.0–100.0)
Platelets: 375 10*3/uL (ref 150–400)
RBC: 2.92 MIL/uL — ABNORMAL LOW (ref 4.22–5.81)
RDW: 15.6 % — ABNORMAL HIGH (ref 11.5–15.5)
WBC: 6 10*3/uL (ref 4.0–10.5)
nRBC: 0 % (ref 0.0–0.2)

## 2018-11-19 LAB — MAGNESIUM: Magnesium: 1.9 mg/dL (ref 1.7–2.4)

## 2018-11-19 LAB — PHOSPHORUS: Phosphorus: 3.9 mg/dL (ref 2.5–4.6)

## 2018-11-19 LAB — DIFFERENTIAL
Abs Immature Granulocytes: 0.04 10*3/uL (ref 0.00–0.07)
BASOS PCT: 1 %
Basophils Absolute: 0.1 10*3/uL (ref 0.0–0.1)
Eosinophils Absolute: 0.1 10*3/uL (ref 0.0–0.5)
Eosinophils Relative: 2 %
Immature Granulocytes: 1 %
Lymphocytes Relative: 25 %
Lymphs Abs: 1.5 10*3/uL (ref 0.7–4.0)
Monocytes Absolute: 0.5 10*3/uL (ref 0.1–1.0)
Monocytes Relative: 9 %
NEUTROS PCT: 62 %
Neutro Abs: 3.8 10*3/uL (ref 1.7–7.7)

## 2018-11-19 LAB — TRIGLYCERIDES: Triglycerides: 119 mg/dL (ref <150)

## 2018-11-19 LAB — PREALBUMIN: Prealbumin: 24.8 mg/dL (ref 18–38)

## 2018-11-19 MED ORDER — TRAVASOL 10 % IV SOLN
INTRAVENOUS | Status: AC
  Administered 2018-11-19: 18:00:00 via INTRAVENOUS
  Filled 2018-11-19: qty 1056

## 2018-11-19 NOTE — Progress Notes (Signed)
CC: abdominal pain  Subjective: Patient is sitting in bed appears comfortable.  He is down to 1 drain.  The drain has some white milky purulent looking fluid in it.  Just a few cc.  GI plans EGD tomorrow.  Objective: Vital signs in last 24 hours: Temp:  [97.5 F (36.4 C)-98.4 F (36.9 C)] 97.5 F (36.4 C) (03/09 0410) Pulse Rate:  [88-109] 88 (03/09 0410) Resp:  [17-18] 18 (03/09 0410) BP: (114-120)/(59-63) 114/59 (03/09 0410) SpO2:  [99 %-100 %] 99 % (03/09 0410) Last BM Date: 11/17/18  660 PO 1880 IV 3750 urine Drains 30 Afebrile, VSS LABS OK,     Intake/Output from previous day: 03/08 0701 - 03/09 0700 In: 2540 [P.O.:660; I.V.:1780; IV Piggyback:100] Out: 3780 [Urine:3750; Drains:30] Intake/Output this shift: No intake/output data recorded.  General appearance: alert, cooperative and no distress Resp: clear to auscultation bilaterally GI: soft, non-tender; bowel sounds normal; no masses,  no organomegaly and Single drain remains on the right side.,  About 2 to 3 cc of milky white fluid in the drain.  Lab Results:  Recent Labs    11/19/18 0357  WBC 6.0  HGB 9.6*  HCT 30.6*  PLT 375    BMET Recent Labs    11/17/18 0354 11/19/18 0357  NA 136 135  K 4.1 4.1  CL 104 102  CO2 27 27  GLUCOSE 106* 100*  BUN 23 21  CREATININE 0.68 0.70  CALCIUM 8.6* 8.9   PT/INR No results for input(s): LABPROT, INR in the last 72 hours.  Recent Labs  Lab 11/15/18 0342 11/19/18 0357  AST 31 49*  ALT 32 53*  ALKPHOS 252* 305*  BILITOT 0.2* 0.4  PROT 7.6 7.7  ALBUMIN 2.3* 2.5*     Lipase     Component Value Date/Time   LIPASE 30 10/25/2018 1721     Medications: . feeding supplement  1 Container Oral TID BM  . feeding supplement (PRO-STAT SUGAR FREE 64)  30 mL Oral BID  . heparin injection (subcutaneous)  5,000 Units Subcutaneous Q8H  . pantoprazole (PROTONIX) IV  40 mg Intravenous Q12H  . sodium chloride flush  10-40 mL Intracatheter Q12H  .  sodium chloride flush  5 mL Intracatheter Q8H  . sodium chloride flush  5 mL Intracatheter Q8H   . sodium chloride 50 mL/hr at 11/19/18 0600  . sodium chloride Stopped (11/16/18 0548)  . piperacillin-tazobactam (ZOSYN)  IV 3.375 g (11/19/18 0525)  . TPN ADULT (ION) 80 mL/hr at 11/19/18 0600    Assessment/Plan Hx of perforated gastric ulcer - s/p repair in 2003 Hx of medication non-compliance AKI- improving   Free air with ascites and thickened bowel loops -Benign exam and no signs of sepsis - this is likely subacute - this could be secondary to obstruction vs recurrent ulcer diseasevs perforated gastric cancer? - CA 19-9 elevated - patient is significantly malnourished with temporal wasting - needs TPN and nutritional support prior to any operative intervention - continue PPIBID, TPN and NPO - UGI study2/19showed no definitive leak, free air still seen - MR abdomen2/21shows gastric thickening - s/p IR drain placement x2 (11/02/18);  Culture:RARE ERYSIPELOTHRIX RHUSIOPATHIAE - CT scan 2/27 shows decreased free air, 2 persistent abscess with drains in place and 2 new fluid collections - s/p initial IR drain x2 upsized/replaced, 2 new IR drains placed - GI holding off on EGD for now due to persistent free air, 11/20/18 expected date for EGD - CT imaging 3/6: All 4  drain sites effectively drained except for a deep central pelvic abscess which is decreased   Severe protein calorie malnutrition- continue TPN and NG for now  - prealbumin 5.7>>10.2>>17.8>>24.8 (3/9)   XLK:GMWNUU, TPN, IVF; protonixBID VTE: SCDs, sq heparin ID: [email or web address] 2/13>> day 25 Foley: none  Plan: Patient is comfortable no abdominal pain.  Plans for EGD tomorrow.  We will continue to follow with you.  Malnutrition is improving.  No weight since admission, will check, start daily weights.       LOS: 25 days    Nevyn Bossman 11/19/2018 [phone]

## 2018-11-19 NOTE — Progress Notes (Signed)
PROGRESS NOTE                                                                                                                                                                                                             Patient Demographics:    Vincent Park, is a 63 y.o. male, DOB - 05-Dec-1955, CLE:[id number]  Admit date - 10/25/2018   Admitting Physician Haydee Monica, MD  Outpatient Primary MD for the patient is Patient, No Pcp Per  LOS - 25  Outpatient Specialists:  Chief Complaint  Patient presents with  . Abdominal Pain       Brief Narrative   63 year old male with history of perforated peptic ulcer in 2003 status post surgical intervention, GERD presenting with progressive epigastric abdominal pain associated with vomiting.  CT of the abdomen pelvis showed high-grade small bowel obstruction with significant ascites and free intraperitoneal air and peritoneal enhancement. Patient admitted with possible perforated bowel and pneumoperitoneum.  Further work-up with abdominal MRI showed multiple intra-abdominal abscess with masslike nonspecific wall thickening involving the proximal stomach.  IR consulted and pain was placed at the site of abscess.  (4 initially and now down to 1 drain). Patient started on TPN with follow-up abdominal CT on 3/6 showing improvement of the intra-abdominal abscesses.   Subjective:   Patient denies abdominal pain this morning.  Denies any nausea or vomiting.   Assessment  & Plan :    Principal Problem:   SBO (small bowel obstruction) (HCC) Tolerating clears.  Continue TPN for further nutritional support.     Active Problems: Pneumoperitoneum with intra-abdominal abscess. IR placed bilateral abdominal and flank drain x4 (2 on 2/21 and 2 on 2/28).  Now down to 1 drain.  Monitor output closely. [facility] following and scheduled for EGD tomorrow to rule out ulceration/perforation.   (Patient did have elevated CA 19-9). Plan on repeat CT scan later this week to assess for removal of the right upper quadrant drain. Continue PPI twice daily.    Protein-calorie malnutrition, severe (HCC) Cachexia (HCC) Continue TPN.  Tolerating clears.     AKI (acute kidney injury) (HCC) Secondary to dehydration.  Resolved.  Alcohol abuse No signs of withdrawal.  History of peptic ulcer disease Continue PPI twice daily.  Follow-up with EGD tomorrow.  Anemia of chronic disease H&H stable.  Monitor.  Transaminitis Mild.  Monitor while on TPN.     Code Status : Full code  Family Communication  : None at bedside  Disposition Plan  : Home pending clinical improvement  Barriers For Discharge : Active symptoms  Consults  : [place] surgery, IR, [facility]  Procedures  : CT abdomen pelvis, IR intra-abdominal drain placement, EGD on 3/10  DVT Prophylaxis  : Subcu heparin  Lab Results  Component Value Date   PLT 375 11/19/2018    Antibiotics  :    Anti-infectives (From admission, onward)   Start     Dose/Rate Route Frequency Ordered Stop   11/09/18 0600  ceFAZolin (ANCEF) IVPB 2g/100 mL premix     2 g 200 mL/hr over 30 Minutes Intravenous On call 11/08/18 1546 11/09/18 0900   10/26/18 0400  piperacillin-tazobactam (ZOSYN) IVPB 3.375 g     3.375 g 12.5 mL/hr over 240 Minutes Intravenous Every 8 hours 10/26/18 0245     10/25/18 1930  piperacillin-tazobactam (ZOSYN) IVPB 3.375 g     3.375 g 100 mL/hr over 30 Minutes Intravenous  Once 10/25/18 1928 10/25/18 2021        Objective:   Vitals:   11/18/18 0542 11/18/18 1337 11/18/18 2119 11/19/18 0410  BP: 121/62 120/63 119/61 (!) 114/59  Pulse: 93 (!) 109 91 88  Resp: Temp: 98.7 F (37.1 C) 98.1 F (36.7 C) 98.4 F (36.9 C) (!) 97.5 F (36.4 C)  TempSrc: Oral Oral Oral Oral  SpO2: 100% 100% 100% 99%  Weight:      Height:        Wt Readings from Last 3 Encounters:  11/02/18 57.1 kg  10/10/18  64.1 kg     Intake/Output Summary (Last 24 hours) at 11/19/2018 1411 Last data filed at 11/19/2018 1040 Gross per 24 hour  Intake 2030 ml  Output 3255 ml  Net -1225 ml     Physical Exam  Gen: Elderly cachectic male, not in distress HEENT: Pallor present, temporal wasting, moist mucosa, supple neck Chest: clear b/l, no added sounds CVS: N S1&S2, no murmurs, GI: soft, sluggish bowel sounds, right upper quadrant drain draining mucopurulent liquid, nontender, nondistended Musculoskeletal: warm, no edema     Data Review:    CBC Recent Labs  Lab 11/19/18 0357  WBC 6.0  HGB 9.6*  HCT 30.6*  PLT 375  MCV 104.8*  MCH 32.9  MCHC 31.4  RDW 15.6*  LYMPHSABS 1.5  MONOABS 0.5  EOSABS 0.1  BASOSABS 0.1    Chemistries  Recent Labs  Lab 11/14/18 0456 11/15/18 0342 11/17/18 0354 11/19/18 0357  NA 134* 136 136 135  K 4.1 4.0 4.1 4.1  CL 102 103 104 102  CO2 GLUCOSE 102* 97 106* 100*  BUN CREATININE 0.70 0.71 0.68 0.70  CALCIUM 8.7* 8.8* 8.6* 8.9  MG 1.8 1.9 1.8 1.9  AST  --  31  --  49*  ALT  --  32  --  53*  ALKPHOS  --  252*  --  305*  BILITOT  --  0.2*  --  0.4   ------------------------------------------------------------------------------------------------------------------ Recent Labs    11/19/18 0357  TRIG 119    Lab Results  Component Value Date   HGBA1C 5.9 (H) 10/26/2018   ------------------------------------------------------------------------------------------------------------------ No results for input(s): TSH, T4TOTAL, T3FREE, THYROIDAB in the last 72 hours.  Invalid input(s): FREET3 ------------------------------------------------------------------------------------------------------------------ No results for input(s): VITAMINB12, FOLATE, FERRITIN,  TIBC, IRON, RETICCTPCT in the last 72 hours.  Coagulation profile No results for input(s): INR, PROTIME in the last 168 hours.  No results for input(s): DDIMER in  the last 72 hours.  Cardiac Enzymes No results for input(s): CKMB, TROPONINI, MYOGLOBIN in the last 168 hours.  Invalid input(s): CK ------------------------------------------------------------------------------------------------------------------ No results found for: BNP  Inpatient Medications  Scheduled Meds: . feeding supplement  1 Container Oral TID BM  . feeding supplement (PRO-STAT SUGAR FREE 64)  30 mL Oral BID  . heparin injection (subcutaneous)  5,000 Units Subcutaneous Q8H  . pantoprazole (PROTONIX) IV  40 mg Intravenous Q12H  . sodium chloride flush  10-40 mL Intracatheter Q12H  . sodium chloride flush  5 mL Intracatheter Q8H  . sodium chloride flush  5 mL Intracatheter Q8H   Continuous Infusions: . sodium chloride 50 mL/hr at 11/19/18 0600  . sodium chloride Stopped (11/16/18 0548)  . piperacillin-tazobactam (ZOSYN)  IV 3.375 g (11/19/18 0525)  . TPN ADULT (ION) 80 mL/hr at 11/19/18 0600  . TPN ADULT (ION)     PRN Meds:.sodium chloride, Melatonin, sodium chloride flush  Micro Results No results found for this or any previous visit (from the past 240 hour(s)).  Radiology Reports Dg Chest 2 View  Result Date: 10/27/2018 CLINICAL DATA:  Dyspnea on exertion. EXAM: CHEST - 2 VIEW COMPARISON:  One view abdomen 10/26/2018.  Abdominal CT 10/25/2018. FINDINGS: 0920 hours. Right arm PICC extends to the superior cavoatrial junction. Nasogastric tube is in place, tip not visualized on the frontal examination. On the lateral view, the NG tube extends in the proximal stomach. The heart size and mediastinal contours are normal. There is minimal right base atelectasis. The lungs are otherwise clear. There is no pleural effusion or pneumothorax. IMPRESSION: Minimal right basilar atelectasis. No other active cardiopulmonary process. Electronically Signed   By: Carey Bullocks M.D.   On: 10/27/2018 13:20   Dg Abd 1 View  Result Date: 11/08/2018 CLINICAL DATA:  Intra-abdominal free  air EXAM: ABDOMEN - 1 VIEW COMPARISON:  November 03, 2018 FINDINGS: The bowel gas pattern is normal. Nasogastric tube is identified distal tip probably in the proximal stomach. Free air in the left upper abdomen is unchanged. Changed to in bilateral abdomen are unchanged. IMPRESSION: Persistent free air is not significantly changed. No bowel obstruction. Electronically Signed   By: Sherian Rein M.D.   On: 11/08/2018 07:34   Dg Abd 1 View  Result Date: 11/03/2018 CLINICAL DATA:  Encounter for nasogastric tube placement. EXAM: ABDOMEN - 1 VIEW COMPARISON:  10/26/2018 FINDINGS: Nasogastric tube in the upper abdomen. Catheter tip is likely in the proximal stomach. The tube side hole is at the GE junction. Bilateral abdominal pigtail drains. Pockets of gas in the left upper abdomen appear to represent persistent free air. Elevation of the right hemidiaphragm with probable right basilar atelectasis. IMPRESSION: Tip of the nasogastric tube is in the proximal stomach region. The side-hole is near the GE junction. Bilateral abdominal pigtail drains. Persistent free air in the left upper quadrant of the abdomen. Electronically Signed   By: Richarda Overlie M.D.   On: 11/03/2018 11:20   Mr Abdomen W Wo Contrast  Result Date: 11/02/2018 CLINICAL DATA:  Evaluate for malignancy. EXAM: MRI ABDOMEN WITHOUT AND WITH CONTRAST TECHNIQUE: Multiplanar multisequence MR imaging of the abdomen was performed both before and after the administration of intravenous contrast. CONTRAST:  5 cc of Gadavist. COMPARISON:  10/25/2018 FINDINGS: Lower chest: Small right pleural effusion is new  from previous exam. Hepatobiliary: Mild hepatic steatosis. No focal enhancing liver lesions identified. The gallbladder appears normal. The common bile duct measures 6 mm in maximum dimension. Pancreas: Normal appearance of the pancreas. No inflammation mass or main duct dilatation. Spleen:  Within normal limits in size and appearance. Adrenals/Urinary Tract:  Normal appearance of the adrenal glands. Small 8 mm cyst noted within the left kidney. No enhancing mass or hydronephrosis. Stomach/Bowel: Masslike thickening of the proximal wall of stomach is identified. This measures approximately 8.5 cm in length and has a thickness of 3.1 cm. This is nonspecific and although this may be due to gastritis underlying mass is not excluded. Persistent abnormal dilatation of small-bowel loops is again identified measuring up to 3.8 cm in diameter. Gaseous distension of the colon is noted as well. Vascular/Lymphatic: Normal appearance of the abdominal aorta. No aneurysm. No definitive adenopathy noted at this time. Other: There are multiple large peripherally enhancing fluid collections identified within both sides of the abdomen. Within the left abdomen fluid collection measures 15.1 by 8.3 by 21.1 cm. In the right hemiabdomen there is a large fluid collection measuring 8.3 x 14.8 by 21.0 cm. These appear more well defined with progressive mural enhancement compatible with mature fluid collections. There is progressive mass effect along the dome of liver, image 19/903. Continued foci of pneumoperitoneum also noted, for example overlying anterior left lobe of liver, image 32/901. Musculoskeletal: No suspicious bone lesions identified. IMPRESSION: 1. Large bilateral abdominal fluid collections are identified on today's exam. These appear increased in size with peripheral enhancement compatible with maturing fluid collections. There is also continued pneumoperitoneum. Findings are compatible with perforated viscus. Suspect multiple intra-abdominal abscesses. 2. There is nonspecific, masslike wall thickening involving the proximal stomach. Findings may be due to gastritis. Underlying mass would be difficult to exclude and correlation with endoscopic inspection should be considered. 3. Persistent abnormal distension of small bowel loops. There is also gaseous distension of the colon. In  the setting of perforated viscus with multiple intra-abdominal fluid collections assist a nonspecific finding and may reflect inflammatory ileus. Bowel obstruction can up be excluded. 4. New small right pleural effusion. Electronically Signed   By: Signa Kell M.D.   On: 11/02/2018 15:03   Ct Abdomen Pelvis W Contrast  Result Date: 11/16/2018 CLINICAL DATA:  Intra-abdominal infection and status post staged placement 4 separate percutaneous peritoneal drainage catheters. These drains all now combined have minimal output. EXAM: CT ABDOMEN AND PELVIS WITH CONTRAST TECHNIQUE: Multidetector CT imaging of the abdomen and pelvis was performed using the standard protocol following bolus administration of intravenous contrast. CONTRAST:  OMNIPAQUE IOHEXOL 300 MG/ML  SOLN COMPARISON:  11/08/2018 FINDINGS: Lower chest: No acute abnormality. Hepatobiliary: No focal liver abnormality is seen. No gallstones, gallbladder wall thickening, or biliary dilatation. Pancreas: Unremarkable. No pancreatic ductal dilatation or surrounding inflammatory changes. Spleen: Normal in size without focal abnormality. Adrenals/Urinary Tract: Adrenal glands are unremarkable. Kidneys are normal, without renal calculi, focal lesion, or hydronephrosis. Bladder is unremarkable. Stomach/Bowel: Nasogastric tube has been removed. There remains suggestion of some proximal gastric wall thickening without evidence of overt visible ulceration. Some free intraperitoneal air remains in the left upper quadrant/subphrenic region no evidence of bowel obstruction or significant ileus. Vascular/Lymphatic: No significant vascular findings are present. No enlarged abdominal or pelvic lymph nodes. Reproductive: Prostate is unremarkable. Other: Drain 1 posterior to the liver with no residual fluid surrounding the drain. Drain 2 in left lateral pericolic region with no fluid surrounding the drain.  Drain 3 in anterior pelvic right lower quadrant with no fluid  surrounding the drainage catheter. Drain 4 in left lower quadrant pelvis just superior to the bladder with no residual fluid surrounding the drain. The only undrained fluid collection is in the deep central pelvis measuring approximately 4.2 x 4.6 cm and demonstrating some decrease in size since the prior CT. Musculoskeletal: No acute or significant osseous findings. IMPRESSION: Significant improvement since the prior CT. All 4 drains have now effectively completely drained their respective abscess collections with no further fluid remaining around any of the drains. The only undrained fluid is a deep central pelvic abscess which has decreased in size since the prior CT. Electronically Signed   By: Irish Lack M.D.   On: 11/16/2018 09:33   Ct Abdomen Pelvis W Contrast  Result Date: 11/08/2018 CLINICAL DATA:  Status post percutaneous catheter drainage large bilateral flank retroperitoneal fluid collections on 11/02/2018. EXAM: CT ABDOMEN AND PELVIS WITH CONTRAST TECHNIQUE: Multidetector CT imaging of the abdomen and pelvis was performed using the standard protocol following bolus administration of intravenous contrast. CONTRAST:  OMNIPAQUE IOHEXOL 300 MG/ML  SOLN COMPARISON:  CT of the abdomen and pelvis on 10/25/2018 and MRI of the abdomen on 11/02/2018 FINDINGS: Lower chest: Mild atelectasis at the right lung base. Hepatobiliary: No focal liver abnormality is seen. No gallstones, gallbladder wall thickening, or biliary dilatation. Pancreas: Unremarkable. No pancreatic ductal dilatation or surrounding inflammatory changes. Spleen: Normal in size without focal abnormality. Adrenals/Urinary Tract: Adrenal glands are unremarkable. Kidneys are normal, without renal calculi, focal lesion, or hydronephrosis. Bladder is unremarkable. Stomach/Bowel: The stomach is decompressed by a nasogastric catheter. There remains wall thickening involving the proximal stomach and body of the stomach. There remains some  free intraperitoneal air in the left upper abdomen and left subphrenic space with overall decrease in free air since the prior CT on 10/25/2018. No small bowel obstruction or significant colonic ileus. Vascular/Lymphatic: No significant vascular findings are present. No enlarged abdominal or pelvic lymph nodes. Reproductive: Prostate is unremarkable. Other: Drainage catheter on the right extends up to the posterior margin of the lower liver. There is a small amount residual fluid inferior to the liver. Drainage catheter on the left extends into a residual large left lateral abdominal abscess measuring up to 5.8 x 10.3 cm transversely. This collection also extends into the left pelvis. Additional right lower quadrant anterior peritoneal abscess just deep to the abdominal wall measures approximately 8 cm in transverse with extension into the right pelvis. Additional deep pelvic collection anterior to the rectum measures approximately 4.2 x 5.8 cm. There are some other small areas of fluid interspersed between small bowel loops in the central abdomen and throughout the pelvis. Musculoskeletal: No acute or significant osseous findings. IMPRESSION: 1. Decrease in free intraperitoneal air in the left upper abdomen and left subphrenic space. 2. Affective drainage of right lateral abdominal fluid collection by catheter placement likely in the retroperitoneal space. 3. The left lateral abdominal fluid collection is still quite large after catheter placement and it is recommended that the drain be flushed appropriately. If this does not result in further fluid return, this catheter may need to be exchanged and upsized as well as slightly repositioned in the cavity. 4. Two additional undrained marginated fluid collections in the right lower quadrant and deep central pelvis. The right lower quadrant collection that extends into the right pelvis is just deep to the abdominal wall and would be amenable to percutaneous catheter  drainage, if  indicated. The deep pelvic collection is not currently approachable percutaneously. Electronically Signed   By: Irish Lack M.D.   On: 11/08/2018 10:50   Ct Abdomen Pelvis W Contrast  Result Date: 10/25/2018 CLINICAL DATA:  63 y/o male with abd 'burning' x3 days. EXAM: CT ABDOMEN AND PELVIS WITH CONTRAST TECHNIQUE: Multidetector CT imaging of the abdomen and pelvis was performed using the standard protocol following bolus administration of intravenous contrast. CONTRAST:  ISOVUE-300 IOPAMIDOL (ISOVUE-300) INJECTION 61% COMPARISON:  None. FINDINGS: Lower chest: No acute abnormality. Hepatobiliary: There hepatic fluid. No focal liver lesions. Gallbladder is present. Pancreas: Unremarkable. No pancreatic ductal dilatation or surrounding inflammatory changes. Spleen: Normal in size without focal abnormality. Adrenals/Urinary Tract: Adrenal glands are normal. Small low-attenuation lesion within the midpole of the LEFT kidney is less than 1 centimeter not fully characterize. There is no hydronephrosis. The bladder and visualized portion of the urethra are normal. Stomach/Bowel: The stomach is normal in appearance. 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 There is marked dilatation of small bowel loops. The most distal ileal loops are not dilated. There is significant wall thickening associated with central bowel loops and multiple angular changes in caliber, raising the question of adhesions. Within a mid to distal small bowel loop in the RIGHT central pelvis, there is radiopaque intraluminal structure measuring 10 millimeters and possibly representing a small ingested bone fragment. The colon is decompressed and otherwise normal in appearance. There is significant ascites and free intraperitoneal air with air-fluid level in the LEFT UPPER QUADRANT. There is enhancement of the peritoneum. Vascular/Lymphatic: No significant vascular findings  are present. No enlarged abdominal or pelvic lymph nodes. Reproductive: Prostate is unremarkable. Other: Large volume ascites, free intraperitoneal air, and peritoneal enhancement. Cachexia. Musculoskeletal: No acute or significant osseous findings. IMPRESSION: 1. High-grade small bowel obstruction.  Thickened small bowel loops. 2. Question of ingested foreign body within a distal small bowel loop in the RIGHT hemipelvis. See above. 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 4. Significant ascites, free intraperitoneal air, and peritoneal enhancement. Critical Value/emergent results were called by telephone at the time of interpretation on 10/25/2018 at 7:13 pm to provider [facility] , who verbally acknowledged these results. Electronically Signed   By: Norva Pavlov M.D.   On: 10/25/2018 19:18   Ir Catheter Tube Change  Result Date: 11/09/2018 INDICATION: 63 year old male with florid infectious peritonitis and extensive intraperitoneal abscesses. He was previously treated by placement of bilateral percutaneous drainage catheters on 11/02/2018. Recurrent CT imaging demonstrates ineffective drainage. He presents today for drain exchange, up size and placement of additional drainage catheters. EXAM: 1. Drain injection, exchange and up size of the existing right upper quadrant drainage catheter. 2. Placement of a new right lower quadrant drainage catheter using ultrasound and fluoroscopic guidance. 3. Drain injection, exchange and up size of the existing left upper quadrant drainage catheter. 4. Placement of a new left lower quadrant drainage catheter using ultrasound and fluoroscopic guidance. MEDICATIONS: 2 g Ancef ANESTHESIA/SEDATION: Fentanyl 100 mcg IV; Versed 3 mg IV Moderate Sedation Time:  40 minutes The patient was continuously monitored during the procedure by the interventional radiology nurse under my direct supervision. FLUOROSCOPY TIME:  3 minutes 24 seconds, 10 mGy COMPLICATIONS: None immediate.  PROCEDURE: Informed written consent was obtained from the patient after a thorough discussion of the procedural risks, benefits and alternatives. All questions were addressed. Maximal Sterile Barrier Technique was utilized including caps, mask, sterile gowns, sterile gloves, sterile  drape, hand hygiene and skin antiseptic. A timeout was performed prior to the initiation of the procedure. Attention was first turned to the existing right upper quadrant drainage catheter which enters the peritoneal cavity from the right mid abdomen. A gentle hand injection of contrast material was performed. There is a persistent abscess cavity around the tip of the catheter. The injected contrast material extends inferiorly along the right abdominal wall nearly to the skin entry site. The decision was made to proceed with drain exchange and up size. The drainage type also be transition to a biliary drain to facilitate drainage of the entire length of the collection. The retention suture was cut and released. The catheter was transected and removed over an Amplatz wire. The skin tract was dilated to 85 [place] and a 30 [place] biliary drainage catheter was advanced over the wire and formed. Injection through the biliary drain demonstrates its location within the right abdominal fluid collection. The drainage catheter was secured to the skin with 0 Prolene suture and connected to JP bulb suction. Attention was next turned to the right lower quadrant where there was a known fluid collection on the prior CT scan. Ultrasound reveals a complex fluid pocket consistent with peritoneal fluid. A skin entry site was marked. Local anesthesia was attained by infiltration with 1% lidocaine. A small dermatotomy was made. Under real-time sonographic guidance, an 18 gauge single wall needle was advanced into the fluid collection. A 0.018 wire was then advanced through the needle and the needle was removed. The skin tract was dilated to 20 [place] and a  [place] 14 [place] percutaneous drain was advanced into the fluid collection and formed. A hand injection of contrast material reveals that the fluid collection extends inferiorly into the right inguinal recess. Therefore, the catheter was manipulated into the more dependent aspect in the right inguinal recess. The catheter was secured to the skin with 0 Prolene suture and connected to JP bulb suction. Attention was next turned to the existing left upper quadrant drain. Again, a hand injection of contrast material was performed. This demonstrates a large persistent abscess cavity. The internal fluid appears quite thick. The decision was made to proceed with up sizing of this drainage catheter to a larger drain to facilitate drainage. The retention suture was cut. The catheter was transected and removed over an Amplatz wire. The skin tract was dilated to 37 [place] and a 16 [place] Thal drainage catheter was advanced over the wire and formed in the left upper quadrant. The catheter was connected to JP bulb suction and secured to the skin with 0 Prolene suture. This fluid collection is known to extend significantly along the left abdominal wall and into the pelvis. Therefore, ultrasound was again used to evaluate the fluid collection. There is a large fluid collection in the left abdomen. A suitable skin entry site was selected and marked. Local anesthesia was attained by infiltration with 1% lidocaine. A small dermatotomy was made. Under real-time sonographic guidance, an 18 gauge single wall needle was used to puncture the fluid collection. An Amplatz wire was advanced in an inferior direction toward the left inguinal recess. The 18 gauge needle was removed. The skin tract was dilated to 23 [place] and a 71 [place] biliary drain was advanced over the wire and positioned in the anatomic pelvis. Contrast injection confirms that the catheter is located within the peritoneal fluid. The catheter was flushed and connected to JP  bulb suction and secured to the skin with 0 Prolene  suture. IMPRESSION: 1. Exchange and up size of existing right upper quadrant drainage catheter to a 14 [place] biliary drain. 2. Placement of a new right lower quadrant 14 French all-purpose drainage catheter. 3. Exchange and up size of existing left upper quadrant drainage catheter to a 16 [place] Thal drain. 4. Placement of a new left lower quadrant 14 French biliary drainage catheter. PLAN: 1. Maintain all tubes to JP bulb suction. 2. All tubes should be flushed at least once per shift. 3. Once drain output has diminished or ceased, additional CT imaging of the abdomen and pelvis with intravenous contrast should be obtained prior to drain removal. Signed, Sterling BigHeath K. McCullough, MD, RPVI [facility] Electronically Signed   By: Malachy MoanHeath  McCullough M.D.   On: 11/09/2018 10:43   Ir Catheter Tube Change  Result Date: 11/09/2018 INDICATION: 63 year old male with florid infectious peritonitis and extensive intraperitoneal abscesses. He was previously treated by placement of bilateral percutaneous drainage catheters on 11/02/2018. Recurrent CT imaging demonstrates ineffective drainage. He presents today for drain exchange, up size and placement of additional drainage catheters. EXAM: 1. Drain injection, exchange and up size of the existing right upper quadrant drainage catheter. 2. Placement of a new right lower quadrant drainage catheter using ultrasound and fluoroscopic guidance. 3. Drain injection, exchange and up size of the existing left upper quadrant drainage catheter. 4. Placement of a new left lower quadrant drainage catheter using ultrasound and fluoroscopic guidance. MEDICATIONS: 2 g Ancef ANESTHESIA/SEDATION: Fentanyl 100 mcg IV; Versed 3 mg IV Moderate Sedation Time:  40 minutes The patient was continuously monitored during the procedure by the interventional radiology nurse under my direct  supervision. FLUOROSCOPY TIME:  3 minutes 24 seconds, 10 mGy COMPLICATIONS: None immediate. PROCEDURE: Informed written consent was obtained from the patient after a thorough discussion of the procedural risks, benefits and alternatives. All questions were addressed. Maximal Sterile Barrier Technique was utilized including caps, mask, sterile gowns, sterile gloves, sterile drape, hand hygiene and skin antiseptic. A timeout was performed prior to the initiation of the procedure. Attention was first turned to the existing right upper quadrant drainage catheter which enters the peritoneal cavity from the right mid abdomen. A gentle hand injection of contrast material was performed. There is a persistent abscess cavity around the tip of the catheter. The injected contrast material extends inferiorly along the right abdominal wall nearly to the skin entry site. The decision was made to proceed with drain exchange and up size. The drainage type also be transition to a biliary drain to facilitate drainage of the entire length of the collection. The retention suture was cut and released. The catheter was transected and removed over an Amplatz wire. The skin tract was dilated to 314 [place] and a 3914 [place] biliary drainage catheter was advanced over the wire and formed. Injection through the biliary drain demonstrates its location within the right abdominal fluid collection. The drainage catheter was secured to the skin with 0 Prolene suture and connected to JP bulb suction. Attention was next turned to the right lower quadrant where there was a known fluid collection on the prior CT scan. Ultrasound reveals a complex fluid pocket consistent with peritoneal fluid. A skin entry site was marked. Local anesthesia was attained by infiltration with 1% lidocaine. A small dermatotomy was made. Under real-time sonographic guidance, an 18 gauge single wall needle was advanced into the fluid collection. A 0.018 wire was then advanced  through the needle and the needle was  removed. The skin tract was dilated to 63 [place] and a Cook 14 [place] percutaneous drain was advanced into the fluid collection and formed. A hand injection of contrast material reveals that the fluid collection extends inferiorly into the right inguinal recess. Therefore, the catheter was manipulated into the more dependent aspect in the right inguinal recess. The catheter was secured to the skin with 0 Prolene suture and connected to JP bulb suction. Attention was next turned to the existing left upper quadrant drain. Again, a hand injection of contrast material was performed. This demonstrates a large persistent abscess cavity. The internal fluid appears quite thick. The decision was made to proceed with up sizing of this drainage catheter to a larger drain to facilitate drainage. The retention suture was cut. The catheter was transected and removed over an Amplatz wire. The skin tract was dilated to 63 [place] and a 16 [place] Thal drainage catheter was advanced over the wire and formed in the left upper quadrant. The catheter was connected to JP bulb suction and secured to the skin with 0 Prolene suture. This fluid collection is known to extend significantly along the left abdominal wall and into the pelvis. Therefore, ultrasound was again used to evaluate the fluid collection. There is a large fluid collection in the left abdomen. A suitable skin entry site was selected and marked. Local anesthesia was attained by infiltration with 1% lidocaine. A small dermatotomy was made. Under real-time sonographic guidance, an 18 gauge single wall needle was used to puncture the fluid collection. An Amplatz wire was advanced in an inferior direction toward the left inguinal recess. The 18 gauge needle was removed. The skin tract was dilated to 46 [place] and a 25 [place] biliary drain was advanced over the wire and positioned in the anatomic pelvis. Contrast injection confirms that the  catheter is located within the peritoneal fluid. The catheter was flushed and connected to JP bulb suction and secured to the skin with 0 Prolene suture. IMPRESSION: 1. Exchange and up size of existing right upper quadrant drainage catheter to a 14 [place] biliary drain. 2. Placement of a new right lower quadrant 14 French all-purpose drainage catheter. 3. Exchange and up size of existing left upper quadrant drainage catheter to a 16 [place] Thal drain. 4. Placement of a new left lower quadrant 14 French biliary drainage catheter. PLAN: 1. Maintain all tubes to JP bulb suction. 2. All tubes should be flushed at least once per shift. 3. Once drain output has diminished or ceased, additional CT imaging of the abdomen and pelvis with intravenous contrast should be obtained prior to drain removal. Signed, Sterling Big, MD, RPVI [facility] Radiology Electronically Signed   By: Malachy Moan M.D.   On: 11/09/2018 10:43   Ir US Guide Bx Asp/drain  Result Date: 11/09/2018 INDICATION: 63 year old male with florid infectious peritonitis and extensive intraperitoneal abscesses. He was previously treated by placement of bilateral percutaneous drainage catheters on 11/02/2018. Recurrent CT imaging demonstrates ineffective drainage. He presents today for drain exchange, up size and placement of additional drainage catheters. EXAM: 1. Drain injection, exchange and up size of the existing right upper quadrant drainage catheter. 2. Placement of a new right lower quadrant drainage catheter using ultrasound and fluoroscopic guidance. 3. Drain injection, exchange and up size of the existing left upper quadrant drainage catheter. 4. Placement of a new left lower quadrant drainage catheter using ultrasound and fluoroscopic guidance. MEDICATIONS: 2 g Ancef ANESTHESIA/SEDATION: Fentanyl 100 mcg  IV; Versed 3 mg IV Moderate Sedation Time:  40 minutes The patient was continuously  monitored during the procedure by the interventional radiology nurse under my direct supervision. FLUOROSCOPY TIME:  3 minutes 24 seconds, 10 mGy COMPLICATIONS: None immediate. PROCEDURE: Informed written consent was obtained from the patient after a thorough discussion of the procedural risks, benefits and alternatives. All questions were addressed. Maximal Sterile Barrier Technique was utilized including caps, mask, sterile gowns, sterile gloves, sterile drape, hand hygiene and skin antiseptic. A timeout was performed prior to the initiation of the procedure. Attention was first turned to the existing right upper quadrant drainage catheter which enters the peritoneal cavity from the right mid abdomen. A gentle hand injection of contrast material was performed. There is a persistent abscess cavity around the tip of the catheter. The injected contrast material extends inferiorly along the right abdominal wall nearly to the skin entry site. The decision was made to proceed with drain exchange and up size. The drainage type also be transition to a biliary drain to facilitate drainage of the entire length of the collection. The retention suture was cut and released. The catheter was transected and removed over an Amplatz wire. The skin tract was dilated to 74 [place] and a 17 [place] biliary drainage catheter was advanced over the wire and formed. Injection through the biliary drain demonstrates its location within the right abdominal fluid collection. The drainage catheter was secured to the skin with 0 Prolene suture and connected to JP bulb suction. Attention was next turned to the right lower quadrant where there was a known fluid collection on the prior CT scan. Ultrasound reveals a complex fluid pocket consistent with peritoneal fluid. A skin entry site was marked. Local anesthesia was attained by infiltration with 1% lidocaine. A small dermatotomy was made. Under real-time sonographic guidance, an 18 gauge single  wall needle was advanced into the fluid collection. A 0.018 wire was then advanced through the needle and the needle was removed. The skin tract was dilated to 54 [place] and a Cook 14 [place] percutaneous drain was advanced into the fluid collection and formed. A hand injection of contrast material reveals that the fluid collection extends inferiorly into the right inguinal recess. Therefore, the catheter was manipulated into the more dependent aspect in the right inguinal recess. The catheter was secured to the skin with 0 Prolene suture and connected to JP bulb suction. Attention was next turned to the existing left upper quadrant drain. Again, a hand injection of contrast material was performed. This demonstrates a large persistent abscess cavity. The internal fluid appears quite thick. The decision was made to proceed with up sizing of this drainage catheter to a larger drain to facilitate drainage. The retention suture was cut. The catheter was transected and removed over an Amplatz wire. The skin tract was dilated to 64 [place] and a 16 [place] Thal drainage catheter was advanced over the wire and formed in the left upper quadrant. The catheter was connected to JP bulb suction and secured to the skin with 0 Prolene suture. This fluid collection is known to extend significantly along the left abdominal wall and into the pelvis. Therefore, ultrasound was again used to evaluate the fluid collection. There is a large fluid collection in the left abdomen. A suitable skin entry site was selected and marked. Local anesthesia was attained by infiltration with 1% lidocaine. A small dermatotomy was made. Under real-time sonographic guidance, an 18 gauge single wall needle was used to puncture  the fluid collection. An Amplatz wire was advanced in an inferior direction toward the left inguinal recess. The 18 gauge needle was removed. The skin tract was dilated to 52 [place] and a 82 [place] biliary drain was advanced over the  wire and positioned in the anatomic pelvis. Contrast injection confirms that the catheter is located within the peritoneal fluid. The catheter was flushed and connected to JP bulb suction and secured to the skin with 0 Prolene suture. IMPRESSION: 1. Exchange and up size of existing right upper quadrant drainage catheter to a 14 [place] biliary drain. 2. Placement of a new right lower quadrant 14 French all-purpose drainage catheter. 3. Exchange and up size of existing left upper quadrant drainage catheter to a 16 [place] Thal drain. 4. Placement of a new left lower quadrant 14 French biliary drainage catheter. PLAN: 1. Maintain all tubes to JP bulb suction. 2. All tubes should be flushed at least once per shift. 3. Once drain output has diminished or ceased, additional CT imaging of the abdomen and pelvis with intravenous contrast should be obtained prior to drain removal. Signed, Sterling Big, MD, RPVI [facility] Radiology Electronically Signed   By: Malachy Moan M.D.   On: 11/09/2018 10:43   Ir US Guide Bx Asp/drain  Result Date: 11/09/2018 INDICATION: 63 year old male with florid infectious peritonitis and extensive intraperitoneal abscesses. He was previously treated by placement of bilateral percutaneous drainage catheters on 11/02/2018. Recurrent CT imaging demonstrates ineffective drainage. He presents today for drain exchange, up size and placement of additional drainage catheters. EXAM: 1. Drain injection, exchange and up size of the existing right upper quadrant drainage catheter. 2. Placement of a new right lower quadrant drainage catheter using ultrasound and fluoroscopic guidance. 3. Drain injection, exchange and up size of the existing left upper quadrant drainage catheter. 4. Placement of a new left lower quadrant drainage catheter using ultrasound and fluoroscopic guidance. MEDICATIONS: 2 g Ancef ANESTHESIA/SEDATION: Fentanyl 100 mcg IV;  Versed 3 mg IV Moderate Sedation Time:  40 minutes The patient was continuously monitored during the procedure by the interventional radiology nurse under my direct supervision. FLUOROSCOPY TIME:  3 minutes 24 seconds, 10 mGy COMPLICATIONS: None immediate. PROCEDURE: Informed written consent was obtained from the patient after a thorough discussion of the procedural risks, benefits and alternatives. All questions were addressed. Maximal Sterile Barrier Technique was utilized including caps, mask, sterile gowns, sterile gloves, sterile drape, hand hygiene and skin antiseptic. A timeout was performed prior to the initiation of the procedure. Attention was first turned to the existing right upper quadrant drainage catheter which enters the peritoneal cavity from the right mid abdomen. A gentle hand injection of contrast material was performed. There is a persistent abscess cavity around the tip of the catheter. The injected contrast material extends inferiorly along the right abdominal wall nearly to the skin entry site. The decision was made to proceed with drain exchange and up size. The drainage type also be transition to a biliary drain to facilitate drainage of the entire length of the collection. The retention suture was cut and released. The catheter was transected and removed over an Amplatz wire. The skin tract was dilated to 42 [place] and a 68 [place] biliary drainage catheter was advanced over the wire and formed. Injection through the biliary drain demonstrates its location within the right abdominal fluid collection. The drainage catheter was secured to the skin with 0 Prolene suture and connected to JP bulb suction. Attention  was next turned to the right lower quadrant where there was a known fluid collection on the prior CT scan. Ultrasound reveals a complex fluid pocket consistent with peritoneal fluid. A skin entry site was marked. Local anesthesia was attained by infiltration with 1% lidocaine. A small  dermatotomy was made. Under real-time sonographic guidance, an 18 gauge single wall needle was advanced into the fluid collection. A 0.018 wire was then advanced through the needle and the needle was removed. The skin tract was dilated to 11 [place] and a Cook 14 [place] percutaneous drain was advanced into the fluid collection and formed. A hand injection of contrast material reveals that the fluid collection extends inferiorly into the right inguinal recess. Therefore, the catheter was manipulated into the more dependent aspect in the right inguinal recess. The catheter was secured to the skin with 0 Prolene suture and connected to JP bulb suction. Attention was next turned to the existing left upper quadrant drain. Again, a hand injection of contrast material was performed. This demonstrates a large persistent abscess cavity. The internal fluid appears quite thick. The decision was made to proceed with up sizing of this drainage catheter to a larger drain to facilitate drainage. The retention suture was cut. The catheter was transected and removed over an Amplatz wire. The skin tract was dilated to 2 [place] and a 16 [place] Thal drainage catheter was advanced over the wire and formed in the left upper quadrant. The catheter was connected to JP bulb suction and secured to the skin with 0 Prolene suture. This fluid collection is known to extend significantly along the left abdominal wall and into the pelvis. Therefore, ultrasound was again used to evaluate the fluid collection. There is a large fluid collection in the left abdomen. A suitable skin entry site was selected and marked. Local anesthesia was attained by infiltration with 1% lidocaine. A small dermatotomy was made. Under real-time sonographic guidance, an 18 gauge single wall needle was used to puncture the fluid collection. An Amplatz wire was advanced in an inferior direction toward the left inguinal recess. The 18 gauge needle was removed. The skin  tract was dilated to 68 [place] and a 62 [place] biliary drain was advanced over the wire and positioned in the anatomic pelvis. Contrast injection confirms that the catheter is located within the peritoneal fluid. The catheter was flushed and connected to JP bulb suction and secured to the skin with 0 Prolene suture. IMPRESSION: 1. Exchange and up size of existing right upper quadrant drainage catheter to a 14 [place] biliary drain. 2. Placement of a new right lower quadrant 14 French all-purpose drainage catheter. 3. Exchange and up size of existing left upper quadrant drainage catheter to a 16 [place] Thal drain. 4. Placement of a new left lower quadrant 14 French biliary drainage catheter. PLAN: 1. Maintain all tubes to JP bulb suction. 2. All tubes should be flushed at least once per shift. 3. Once drain output has diminished or ceased, additional CT imaging of the abdomen and pelvis with intravenous contrast should be obtained prior to drain removal. Signed, Sterling Big, MD, RPVI [facility] Radiology Electronically Signed   By: Malachy Moan M.D.   On: 11/09/2018 10:43   Ir US Guide Bx Asp/drain  Result Date: 11/02/2018 INDICATION: Bilateral intra-abdominal peritoneal fluid collections EXAM: Ultrasound bilateral abdominal flank abscess drains MEDICATIONS: The patient is currently admitted to the hospital and receiving intravenous antibiotics. The antibiotics were administered within an appropriate  time frame prior to the initiation of the procedure. ANESTHESIA/SEDATION: Fentanyl 100 mcg IV; Versed 2.0 mg IV Moderate Sedation Time:  13 minutes The patient was continuously monitored during the procedure by the interventional radiology nurse under my direct supervision. COMPLICATIONS: None. PROCEDURE: Informed written consent was obtained from the patient after a thorough discussion of the procedural risks, benefits and alternatives. All questions were  addressed. Maximal Sterile Barrier Technique was utilized including caps, mask, sterile gowns, sterile gloves, sterile drape, hand hygiene and skin antiseptic. A timeout was performed prior to the initiation of the procedure. Previous imaging reviewed. Ultrasound performed. The complex bilateral flank abdominal fluid collection or localized and marked. Under sterile conditions and local anesthesia, ultrasound needle access performed of the bilateral flank abdominal fluid collections with 18 gauge 15 cm needles. Needle positions confirmed with ultrasound. Images obtained for documentation. Amplatz guidewires inserted followed by bilateral 10 French drains. Drain catheter positions confirmed with ultrasound. Syringe aspiration yielded purulent fluid bilaterally compatible with peritonitis and intra-abdominal pus. Catheter secured with Prolene sutures and connected to external gravity drainage bags. Approximately 1 L of purulent fluid drained from each catheter. IMPRESSION: Successful ultrasound bilateral abdominal flank abscess drain placements. Purulent fluid removed. Sample sent for culture and cytology. Electronically Signed   By: Judie Petit.  Shick M.D.   On: 11/02/2018 16:52   Ir US Guide Bx Asp/drain  Result Date: 11/02/2018 INDICATION: Bilateral intra-abdominal peritoneal fluid collections EXAM: Ultrasound bilateral abdominal flank abscess drains MEDICATIONS: The patient is currently admitted to the hospital and receiving intravenous antibiotics. The antibiotics were administered within an appropriate time frame prior to the initiation of the procedure. ANESTHESIA/SEDATION: Fentanyl 100 mcg IV; Versed 2.0 mg IV Moderate Sedation Time:  13 minutes The patient was continuously monitored during the procedure by the interventional radiology nurse under my direct supervision. COMPLICATIONS: None. PROCEDURE: Informed written consent was obtained from the patient after a thorough discussion of the procedural risks,  benefits and alternatives. All questions were addressed. Maximal Sterile Barrier Technique was utilized including caps, mask, sterile gowns, sterile gloves, sterile drape, hand hygiene and skin antiseptic. A timeout was performed prior to the initiation of the procedure. Previous imaging reviewed. Ultrasound performed. The complex bilateral flank abdominal fluid collection or localized and marked. Under sterile conditions and local anesthesia, ultrasound needle access performed of the bilateral flank abdominal fluid collections with 18 gauge 15 cm needles. Needle positions confirmed with ultrasound. Images obtained for documentation. Amplatz guidewires inserted followed by bilateral 10 French drains. Drain catheter positions confirmed with ultrasound. Syringe aspiration yielded purulent fluid bilaterally compatible with peritonitis and intra-abdominal pus. Catheter secured with Prolene sutures and connected to external gravity drainage bags. Approximately 1 L of purulent fluid drained from each catheter. IMPRESSION: Successful ultrasound bilateral abdominal flank abscess drain placements. Purulent fluid removed. Sample sent for culture and cytology. Electronically Signed   By: Judie Petit.  Shick M.D.   On: 11/02/2018 16:52   Dg Abd Portable 1v  Result Date: 10/26/2018 CLINICAL DATA:  NG tube placement EXAM: PORTABLE ABDOMEN - 1 VIEW COMPARISON:  CT 10/25/2018 FINDINGS: NG tube tip is in the mid stomach. Dilated centralized small bowel loops compatible with small bowel obstruction ascites as seen on prior CT. IMPRESSION: NG tube tip in the mid stomach. Electronically Signed   By: Charlett Nose M.D.   On: 10/26/2018 02:51   Dg Kayleen Memos W Single Cm (sol Or Thin Ba)  Result Date: 10/31/2018 CLINICAL DATA:  63 year old male inpatient with remote history of repair of  perforated gastric ulcer, presents with pneumoperitoneum and clinical concern for perforated gastric ulcer or gastric outlet obstruction. EXAM: WATER SOLUBLE UPPER  GI SERIES TECHNIQUE: Single-column upper GI series was performed using water soluble contrast. CONTRAST:  Water-soluble oral contrast. COMPARISON:  10/26/2018 abdominal radiographs and 10/25/2018 CT abdomen/pelvis. FLUOROSCOPY TIME:  Fluoroscopy Time:  2 minutes 6 seconds Radiation Exposure Index (if provided by the fluoroscopic device): 33.9 mGy Number of Acquired Spot Images: 6 FINDINGS: Enteric tube terminates in proximal stomach with side port at the esophagogastric junction. Free intraperitoneal air is noted in the left upper quadrant on the scout radiograph. Mildly dilated small bowel loops are noted in the central abdomen, similar to decreased from prior abdominal radiograph. Grossly normal esophagus on limited views. Oral contrast transits the stomach into the duodenum and proximal small bowel without delay. An ulcer is identified in the anterior proximal body of the stomach. No contrast leak from the stomach into the peritoneal cavity was elicited. There is irregular wall thickening in the anterior proximal gastric wall surrounding the ulcer site. There is narrowing of the body of the stomach. Incidentally noted small bowel malrotation, with the duodenal jejunal junction to the right of the spine. Proximal small bowel loops are mildly dilated. IMPRESSION: 1. Gastric ulcer identified in the anterior proximal body of the stomach, with surrounding irregular gastric wall thickening, cannot exclude a malignant ulcer. Persistent free intraperitoneal air in the left upper quadrant surrounding the ulcer site. No water-soluble contrast leak from the stomach into the peritoneal cavity detected. 2. No evidence of gastric outlet obstruction. Nonspecific narrowing of the body of the stomach. 3. Incidental small bowel malrotation. Proximal small bowel dilatation is unchanged from recent CT and abdominal radiograph, probably due to ileus due to peritonitis. These results were called by telephone at the time of  interpretation on 10/31/2018 at 11:41 am to Dr. Abigail Miyamoto , who verbally acknowledged these results. Electronically Signed   By: Delbert Phenix M.D.   On: 10/31/2018 12:00   [place] Ekg Site Rite  Result Date: 10/26/2018 If Site Rite image not attached, placement could not be confirmed due to current cardiac rhythm.   Time Spent in minutes  25   Favor Kreh M.D on 11/19/2018 at 2:11 PM  Between 7am to 7pm - Pager - [phone]  After 7pm go to [email or web address] - password [facility]  [facility] -  Office  [phone]

## 2018-11-19 NOTE — Progress Notes (Signed)
Referring Physician(s): Dr. Alanda Slim  Supervising Physician: Malachy Moan  Patient Status:  [name] - In-pt  Chief Complaint: Pneumoperitoneum, s/p three drains removed on 3/6; recurrent ulcer disease vs. Gastric perforation  Subjective: Patient says that he is doing well and has no complaints. Patient says he is sleeping better after the removal of three drains on Friday 3/6. Patient denies abdominal pain, nausea, vomiting, fever, and chest pain.   Allergies: Patient has no known allergies.  Medications: Prior to Admission medications   Medication Sig Start Date End Date Taking? Authorizing Provider  Alum & Mag Hydroxide-Simeth (GI COCKTAIL) SUSP suspension Take 30 mLs by mouth 2 (two) times daily. Shake well. 10/10/18  Yes Gwyneth Sprout, MD  omeprazole (PRILOSEC) 20 MG capsule Take 1 capsule (20 mg total) by mouth daily. 10/10/18  Yes Gwyneth Sprout, MD     Vital Signs: BP (!) 114/59 (BP Location: Left Arm)   Pulse 88   Temp (!) 97.5 F (36.4 C) (Oral)   Resp 18   Ht 6\' 2"  (1.88 m)   Wt 125 lb 14.1 oz (57.1 kg)   SpO2 99%   BMI 16.16 kg/m   Physical Exam  Patient is awake and alert; resting comfortably in bed. Patient appears cachectic. Abdomen was soft and non-tender to palpation. Only one drain remains in RUQ; still producing minimal purulent fluid. Remaining drain site is clean and dry. Other three drain removal sites are healing well, with no erythema or discharge at the sites.   Imaging: Ct Abdomen Pelvis W Contrast  Result Date: 11/16/2018 CLINICAL DATA:  Intra-abdominal infection and status post staged placement 4 separate percutaneous peritoneal drainage catheters. These drains all now combined have minimal output. EXAM: CT ABDOMEN AND PELVIS WITH CONTRAST TECHNIQUE: Multidetector CT imaging of the abdomen and pelvis was performed using the standard protocol following bolus administration of intravenous contrast. CONTRAST:  OMNIPAQUE IOHEXOL 300 MG/ML   SOLN COMPARISON:  11/08/2018 FINDINGS: Lower chest: No acute abnormality. Hepatobiliary: No focal liver abnormality is seen. No gallstones, gallbladder wall thickening, or biliary dilatation. Pancreas: Unremarkable. No pancreatic ductal dilatation or surrounding inflammatory changes. Spleen: Normal in size without focal abnormality. Adrenals/Urinary Tract: Adrenal glands are unremarkable. Kidneys are normal, without renal calculi, focal lesion, or hydronephrosis. Bladder is unremarkable. Stomach/Bowel: Nasogastric tube has been removed. There remains suggestion of some proximal gastric wall thickening without evidence of overt visible ulceration. Some free intraperitoneal air remains in the left upper quadrant/subphrenic region no evidence of bowel obstruction or significant ileus. Vascular/Lymphatic: No significant vascular findings are present. No enlarged abdominal or pelvic lymph nodes. Reproductive: Prostate is unremarkable. Other: Drain 1 posterior to the liver with no residual fluid surrounding the drain. Drain 2 in left lateral pericolic region with no fluid surrounding the drain. Drain 3 in anterior pelvic right lower quadrant with no fluid surrounding the drainage catheter. Drain 4 in left lower quadrant pelvis just superior to the bladder with no residual fluid surrounding the drain. The only undrained fluid collection is in the deep central pelvis measuring approximately 4.2 x 4.6 cm and demonstrating some decrease in size since the prior CT. Musculoskeletal: No acute or significant osseous findings. IMPRESSION: Significant improvement since the prior CT. All 4 drains have now effectively completely drained their respective abscess collections with no further fluid remaining around any of the drains. The only undrained fluid is a deep central pelvic abscess which has decreased in size since the prior CT. Electronically Signed   By: Irish Lack  M.D.   On: 11/16/2018 09:33    Labs:  CBC: Recent  Labs    11/05/18 0429 11/07/18 0520 11/12/18 0416 11/19/18 0357  WBC 5.1 6.8 10.1 6.0  HGB 9.1* 9.6* 9.6* 9.6*  HCT 28.2* 29.2* 30.0* 30.6*  PLT 280 308 438* 375    COAGS: Recent Labs    10/26/18 0500 11/08/18 1641  INR 1.13 1.0    BMP: Recent Labs    11/14/18 0456 11/15/18 0342 11/17/18 0354 11/19/18 0357  NA 134* 136 136 135  K 4.1 4.0 4.1 4.1  CL 102 103 104 102  CO2 28 27 27 27   GLUCOSE 102* 97 106* 100*  BUN 21 21 23 21   CALCIUM 8.7* 8.8* 8.6* 8.9  CREATININE 0.70 0.71 0.68 0.70  GFRNONAA >60 >60 >60 >60  GFRAA >60 >60 >60 >60    LIVER FUNCTION TESTS: Recent Labs    11/11/18 0519 11/12/18 0416 11/15/18 0342 11/19/18 0357  BILITOT 0.3 0.3 0.2* 0.4  AST 27 28 31  49*  ALT 23 23 32 53*  ALKPHOS 206* 215* 252* 305*  PROT 7.2 7.1 7.6 7.7  ALBUMIN 1.9* 2.0* 2.3* 2.5*    Assessment and Plan: Pneumoperitoneum with intraabdominal abscesses s/p bilateral abdominal flank drain placements x 4 (2 on 11/02/18 by Dr. Denny Levy, 2 on 11/09/18 by Dr. Archer Asa). Labs are stable Continue to follow and monitor output on remaining drain. EGD is scheduled for tomorrow 3/10.  Plan for CT scan later this week for assessment of RUQ drain removal.  Electronically Signed: D. Jeananne Rama, PA-C/Maxwell Dennie Bible, PA-S 11/19/2018, 11:20 AM   I spent a total of 15 Minutes at the the patient's bedside AND on the patient's hospital floor or unit, greater than 50% of which was counseling/coordinating care for RUQ drain placement    Patient ID: Vincent Park, male   DOB: 11-18-1955, 63 y.o.   MRN: [id number]

## 2018-11-19 NOTE — Progress Notes (Signed)
PHARMACY - ADULT TOTAL PARENTERAL NUTRITION CONSULT NOTE   Pharmacy Consult for TPN Indication: bowel rest, severe malnutrition PTA  Patient Measurements: Height: _0  (188 cm) Weight: 125 lb 14.1 oz (57.1 kg) IBW/kg (Calculated) : 82.2 TPN AdjBW (KG): 54 Body mass index is 16.16 kg/m. Usual Weight: unknown as patient is poor historian, but reports significant weight loss over the past year  HPI: 71 yoM with PMH PUD with perforated gastric ulcer s/p repair in 2003, GERD presents with worsening chronic abdominal pain and associated weight loss. Cachectic appearing. CT reveals ascites with concern for SBO vs recurrent ulcer disease. Pt is NPO. Consulted by surgery for TPN management. Pt will require nutritional support prior to any surgical intervention.  Current Nutrition: NPO/chips IVF: NS at 50 mL/hr  Central access: Double lumen PICC placed 2/14 TPN start date: 2/14  Significant events:  2/27 KUB w/ persistent free air, CT scan w/ persistent abd fluid collection plus 2 additional fluid collections RLQ anda deep central pelvis 2/28 to IR for drain exchanges/upsize x 2, place 2 more drains if possible 3/3: per notes, pt not candidate for surgery right now, needs more bowel rest, needs EGD closer to when is surgical candidate, to reevaluate next week- still with free air.  3/4: NG tube removed 3/5: started CLD, supplements per dietary 3/6: per CCS, patient tolerating CLD. Abdominal CT: "all 4 drain sites effectively drained except for a deep central pelvic abscess which is decreased" per CCS note  Planning for EGD on 3/10.  ASSESSMENT                                                                                                Today, 11/19/2018:   Glucose 100 (goal 100-150). Has been getting BG checked with AM labs only due to well controlled BG  Electrolytes - stable WNL  Renal - SCr, bicarb, BUN wnl, UOP 3.7 L, 30 mL total per 1 drain  Hepatic - Alk Phos (3/9) continues to  rise slowly; LFTs slightly elevated; albumin remains low.  TGs - remain WNL  Prealbumin - 24.8 (3/9) WNL  NUTRITIONAL GOALS                                                                                 RD recs (per note on 3/5): Kcal: 1900 - 2100 grams Protein: 95 - 115 g Fluid: >/= 1.9 L/day  PLAN  At 1800 today:  Continue TPN at goal rate of 80 mL/hr providing 100% of patient needs with 1978 kcal and 106 g protein  Electrolytes: no changes today  No SSI needed  Continue IVF as ordered  TPN lab panels on Mondays & Thursdays  Lenis Noon, PharmD 11/19/18 11:07 AM

## 2018-11-19 NOTE — Progress Notes (Signed)
Vincent Park 9:35 AM  Subjective: Patient seen and examined and discussed with my partner Dr. Matthias Hughs and the surgical team and is tolerating clear liquids and he has no new complaints all of his drains put one out  Objective: Signs stable afebrile cachectic lungs are clear decreased heart sounds abdomen a little firm but nontender labs reviewed  Assessment: Probable perforated ulcer question of gastric cancer  Plan: Okay to proceed tomorrow with endoscopy and the risks benefits and methods was discussed with the patient  [facility] E  Pager [phone] After 5PM or if no answer call [phone]

## 2018-11-20 ENCOUNTER — Encounter (HOSPITAL_COMMUNITY): Payer: Self-pay | Admitting: *Deleted

## 2018-11-20 ENCOUNTER — Inpatient Hospital Stay (HOSPITAL_COMMUNITY): Payer: Self-pay | Admitting: Anesthesiology

## 2018-11-20 ENCOUNTER — Encounter (HOSPITAL_COMMUNITY)
Admission: EM | Disposition: A | Discharge status: Home / Self Care | Payer: Self-pay | Source: Home / Self Care | Attending: Student

## 2018-11-20 HISTORY — PX: ESOPHAGOGASTRODUODENOSCOPY (EGD) WITH PROPOFOL: SHX5813

## 2018-11-20 HISTORY — PX: BIOPSY: SHX5522

## 2018-11-20 LAB — COMPREHENSIVE METABOLIC PANEL
ALT: 62 U/L — ABNORMAL HIGH (ref 0–44)
ANION GAP: 7 (ref 5–15)
AST: 53 U/L — ABNORMAL HIGH (ref 15–41)
Albumin: 2.6 g/dL — ABNORMAL LOW (ref 3.5–5.0)
Alkaline Phosphatase: 304 U/L — ABNORMAL HIGH (ref 38–126)
BUN: 21 mg/dL (ref 8–23)
CO2: 25 mmol/L (ref 22–32)
Calcium: 8.8 mg/dL — ABNORMAL LOW (ref 8.9–10.3)
Chloride: 102 mmol/L (ref 98–111)
Creatinine, Ser: 0.66 mg/dL (ref 0.61–1.24)
GFR calc Af Amer: >60 mL/min (ref >60)
GFR calc non Af Amer: >60 mL/min (ref >60)
Glucose, Bld: 101 mg/dL — ABNORMAL HIGH (ref 70–99)
Potassium: 4.1 mmol/L (ref 3.5–5.1)
Sodium: 134 mmol/L — ABNORMAL LOW (ref 135–145)
Total Bilirubin: 0.2 mg/dL — ABNORMAL LOW (ref 0.3–1.2)
Total Protein: 7.5 g/dL (ref 6.5–8.1)

## 2018-11-20 SURGERY — ESOPHAGOGASTRODUODENOSCOPY (EGD) WITH PROPOFOL
Anesthesia: Monitor Anesthesia Care

## 2018-11-20 MED ORDER — PROPOFOL 500 MG/50ML IV EMUL
INTRAVENOUS | Status: DC | PRN
  Administered 2018-11-20: 100 ug/kg/min via INTRAVENOUS

## 2018-11-20 MED ORDER — PROPOFOL 10 MG/ML IV BOLUS
INTRAVENOUS | Status: AC
  Filled 2018-11-20: qty 40

## 2018-11-20 MED ORDER — SODIUM CHLORIDE 0.9 % IV SOLN: INTRAVENOUS | Status: DC

## 2018-11-20 MED ORDER — TRAVASOL 10 % IV SOLN
INTRAVENOUS | Status: AC
  Administered 2018-11-20: 17:00:00 via INTRAVENOUS
  Filled 2018-11-20: qty 1056

## 2018-11-20 MED ORDER — LIDOCAINE 2% (20 MG/ML) 5 ML SYRINGE
INTRAMUSCULAR | Status: DC | PRN
  Administered 2018-11-20: 100 mg via INTRAVENOUS

## 2018-11-20 MED ORDER — FENTANYL CITRATE (PF) 100 MCG/2ML IJ SOLN
INTRAMUSCULAR | Status: DC | PRN
  Administered 2018-11-20 (×2): 50 ug via INTRAVENOUS

## 2018-11-20 MED ORDER — PANTOPRAZOLE SODIUM 40 MG IV SOLR
40.0000 mg | INTRAVENOUS | Status: DC
  Administered 2018-11-21 – 2018-11-22 (×2): 40 mg via INTRAVENOUS
  Filled 2018-11-20 (×2): qty 40

## 2018-11-20 MED ORDER — FENTANYL CITRATE (PF) 100 MCG/2ML IJ SOLN
INTRAMUSCULAR | Status: AC
  Filled 2018-11-20: qty 2

## 2018-11-20 MED ORDER — PROPOFOL 10 MG/ML IV BOLUS
INTRAVENOUS | Status: DC | PRN
  Administered 2018-11-20: 20 mg via INTRAVENOUS
  Administered 2018-11-20: 30 mg via INTRAVENOUS

## 2018-11-20 SURGICAL SUPPLY — 14 items

## 2018-11-20 NOTE — Progress Notes (Signed)
Referring Physician(s): Dr. Alanda Slim  Supervising Physician: Dr. Miles Costain  Patient Status:  [facility] - In-pt  Chief Complaint: Pneumoperitoneum, s/p three drains removed on 3/6; recurrent ulcer disease vs. Gastric perforation  Subjective: Patient says that he is doing well and has no complaints. Feeling better Had EGD today with good report. Diet to be started.  Allergies: Patient has no known allergies.  Medications:  Current Facility-Administered Medications:  .  0.9 %  sodium chloride infusion, , Intravenous, Continuous, Samtani, Jai-Gurmukh, MD, Last Rate: 50 mL/hr at 11/20/18 1150 .  0.9 %  sodium chloride infusion, , Intravenous, PRN, Alwyn Ren, MD, Stopped at 11/16/18 [phone] .  feeding supplement (BOOST / RESOURCE BREEZE) liquid 1 Container, 1 Container, Oral, TID BM, Burnadette Pop, MD, 1 Container at 11/20/18 1154 .  feeding supplement (PRO-STAT SUGAR FREE 64) liquid 30 mL, 30 mL, Oral, BID, Adhikari, Amrit, MD, 30 mL at 11/19/18 2139 .  heparin injection 5,000 Units, 5,000 Units, Subcutaneous, Q8H, Herby Abraham, RPH, 5,000 Units at 11/20/18 7209 .  Melatonin TABS 6 mg, 6 mg, Oral, QHS PRN, Candelaria Stagers T, MD, 6 mg at 11/03/18 2211 .  pantoprazole (PROTONIX) injection 40 mg, 40 mg, Intravenous, Q12H, Sherrie George, PA-C, 40 mg at 11/20/18 1151 .  piperacillin-tazobactam (ZOSYN) IVPB 3.375 g, 3.375 g, Intravenous, Q8H, Tarry Kos A, MD, Last Rate: 12.5 mL/hr at 11/20/18 0526, 3.375 g at 11/20/18 0526 .  sodium chloride flush (NS) 0.9 % injection 10-40 mL, 10-40 mL, Intracatheter, Q12H, Samtani, Jai-Gurmukh, MD, 10 mL at 11/18/18 1027 .  sodium chloride flush (NS) 0.9 % injection 10-40 mL, 10-40 mL, Intracatheter, PRN, Rhetta Mura, MD, 10 mL at 11/16/18 0903 .  sodium chloride flush (NS) 0.9 % injection 5 mL, 5 mL, Intracatheter, Q8H, Berdine Dance, MD, 5 mL at 11/18/18 1636 .  sodium chloride flush (NS) 0.9 % injection 5 mL, 5 mL, Intracatheter, Q8H,  Malachy Moan, MD, 5 mL at 11/20/18 0528 .  TPN ADULT (ION), , Intravenous, Continuous TPN, Cindi Carbon, [facility], Last Rate: 80 mL/hr at 11/19/18 1807 .  TPN ADULT (ION), , Intravenous, Continuous TPN, Cindi Carbon, RPH    Vital Signs: BP (!) 149/69 (BP Location: Left Arm)   Pulse (!) 113   Temp 98 F (36.7 C) (Oral)   Resp 18   Ht 6\' 2"  (1.88 m)   Wt 51.7 kg   SpO2 100%   BMI 14.63 kg/m   Physical Exam  Patient is awake and alert; resting comfortably in bed.\ Abdomen was soft and non-tender to palpation. RUQ drain remains intact, site clean, NT still producing minimal purulent fluid.     Imaging: No results found.  Labs:  CBC: Recent Labs    11/05/18 0429 11/07/18 0520 11/12/18 0416 11/19/18 0357  WBC 5.1 6.8 10.1 6.0  HGB 9.1* 9.6* 9.6* 9.6*  HCT 28.2* 29.2* 30.0* 30.6*  PLT 280 308 438* 375    COAGS: Recent Labs    10/26/18 0500 11/08/18 1641  INR 1.13 1.0    BMP: Recent Labs    11/15/18 0342 11/17/18 0354 11/19/18 0357 11/20/18 0408  NA 136 136 135 134*  K 4.0 4.1 4.1 4.1  CL 103 104 102 102  CO2 27 27 27 25   GLUCOSE 97 106* 100* 101*  BUN 21 23 21 21   CALCIUM 8.8* 8.6* 8.9 8.8*  CREATININE 0.71 0.68 0.70 0.66  GFRNONAA >60 >60 >60 >60  GFRAA >60 >60 >60 >60  LIVER FUNCTION TESTS: Recent Labs    11/12/18 0416 11/15/18 0342 11/19/18 0357 11/20/18 0408  BILITOT 0.3 0.2* 0.4 0.2*  AST 28 31 49* 53*  ALT 23 32 53* 62*  ALKPHOS 215* 252* 305* 304*  PROT 7.1 7.6 7.7 7.5  ALBUMIN 2.0* 2.3* 2.5* 2.6*    Assessment and Plan: Pneumoperitoneum with intraabdominal abscesses s/p bilateral abdominal flank drain placements x 4. S/p removal of 3 drains. Plan for CT scan later this week for assessment of remaining RUQ drain removal.  Electronically Signed: Brayton El, PA-C 11/20/2018, 2:09 PM   I spent a total of 15 Minutes at the the patient's bedside AND on the patient's hospital floor or unit, greater than 50% of which was  counseling/coordinating care for RUQ drain placement    Patient ID: Vincent Park, male   DOB: April 30, 1956, 63 y.o.   MRN: [id number]

## 2018-11-20 NOTE — Anesthesia Procedure Notes (Signed)
Procedure Name: MAC Date/Time: 11/20/2018 9:00 AM Performed by: Deliah Boston, CRNA Pre-anesthesia Checklist: Patient identified, Emergency Drugs available, Suction available and Patient being monitored Patient Re-evaluated:Patient Re-evaluated prior to induction Oxygen Delivery Method: Nasal cannula Induction Type: IV induction Placement Confirmation: positive ETCO2 and breath sounds checked- equal and bilateral

## 2018-11-20 NOTE — Progress Notes (Signed)
Day of Surgery    CC: Abdominal pain  Subjective: She is back from EGD no abdominal pains no complaints.  25 cc from JP.  Objective: Vital signs in last 24 hours: Temp:  [98 F (36.7 C)-98.5 F (36.9 C)] 98.4 F (36.9 C) (03/10 0921) Pulse Rate:  [80-90] 88 (03/10 0937) Resp:  [10-20] 14 (03/10 0937) BP: (94-122)/(55-65) 106/63 (03/10 0930) SpO2:  [97 %-100 %] 97 % (03/10 0937) Weight:  [51.7 kg] 51.7 kg (03/10 0451) Last BM Date: 11/18/18 1280 p.o. yesterday 800 IV 4250 urine Drain 25 Stool x1 Afebrile vital signs are stable LFTs are stable/CMP is stable. Intake/Output from previous day: 03/09 0701 - 03/10 0700 In: 2072.5 [P.O.:1280; I.V.:792.5] Out: 4275 [Urine:4250; Drains:25] Intake/Output this shift: Total I/O In: 800 [I.V.:800] Out: 375 [Urine:375]  General appearance: alert, cooperative and no distress GI: soft, non-tender; bowel sounds normal; no masses,  no organomegaly and Same drainage from the JP.  Lab Results:  Recent Labs    11/19/18 0357  WBC 6.0  HGB 9.6*  HCT 30.6*  PLT 375    BMET Recent Labs    11/19/18 0357 11/20/18 0408  NA 135 134*  K 4.1 4.1  CL 102 102  CO2 27 25  GLUCOSE 100* 101*  BUN 21 21  CREATININE 0.70 0.66  CALCIUM 8.9 8.8*   PT/INR No results for input(s): LABPROT, INR in the last 72 hours.  Recent Labs  Lab 11/15/18 0342 11/19/18 0357 11/20/18 0408  AST 31 49* 53*  ALT 32 53* 62*  ALKPHOS 252* 305* 304*  BILITOT 0.2* 0.4 0.2*  PROT 7.6 7.7 7.5  ALBUMIN 2.3* 2.5* 2.6*     Lipase     Component Value Date/Time   LIPASE 30 10/25/2018 1721     Medications: . feeding supplement  1 Container Oral TID BM  . feeding supplement (PRO-STAT SUGAR FREE 64)  30 mL Oral BID  . heparin injection (subcutaneous)  5,000 Units Subcutaneous Q8H  . pantoprazole (PROTONIX) IV  40 mg Intravenous Q12H  . sodium chloride flush  10-40 mL Intracatheter Q12H  . sodium chloride flush  5 mL Intracatheter Q8H  . sodium  chloride flush  5 mL Intracatheter Q8H  EGD: Patient Name: Quintarious Lutes Procedure Date: 11/20/2018 MRN: [id number] Attending MD: Vida Rigger, MD Date of Birth: 1962/02/27 CSN: [id number] Age: 63 Admit Type: Outpatient - Normal larynx. - Tiny hiatal hernia. - Scar in the incisura. Biopsied. - Normal duodenal bulb, first portion of the duodenum and second portion of the duodenum. - Enlarged gastric folds. - The examination was otherwise normal.  Assessment/Plan Hx of medication non-compliance AKI- improving   Free air with ascites and thickened bowel loops -Benign exam and no signs of sepsis - this is likely subacute - this could be secondary to obstruction vs recurrent ulcer diseasevs perforated gastric cancer? - CA 19-9 elevated - patient is significantly malnourished with temporal wasting - needs TPN and nutritional support prior to any operative intervention - continue PPIBID, TPN and NPO - UGI study2/19showed no definitive leak, free air still seen - MR abdomen2/21shows gastric thickening - s/p IR drain placement x2 (11/02/18);  Culture:RARE ERYSIPELOTHRIX RHUSIOPATHIAE - CT scan 2/27 shows decreased free air, 2 persistent abscess with drains in place and 2 new fluid collections - s/p initial IR drain x2 upsized/replaced, 2 new IR drains placed - GI holding off on EGD for now due to persistent free air, 11/20/18 expected date for EGD -CT imaging 3/6: All 4  drain sites effectively drained except for a deep central pelvic abscess which is decreased  EGD 3/10:Tiny hiatal hernia. Scar in the incisura. Biopsied.  Normal duodenal bulb, first portion of the duodenum and second portion of the duodenum.  Enlarged gastric folds. - The examination was otherwise normal.   Severe protein calorie malnutrition- continue TPN and NG for now  - prealbumin 5.7>>10.2>>17.8>>24.8 (3/9)   TWS:FKCLEX, TPN, IVF; protonixBID VTE: SCDs, sq heparin ID: zosyn 2/13>> day 25 Foley:  none  Plan: From our standpoint continue to drain.  He can go back on liquids and have his diet advanced as Dr. Ewing Schlein recommends.   LOS: 26 days    Sherron Mapp 11/20/2018 [phone]

## 2018-11-20 NOTE — Progress Notes (Signed)
Vincent Park 8:45 AM  Subjective: Patient without any new complaints  Objective: Vital signs stable afebrile exam please see preassessment evaluation chemistry stable  Assessment: Abnormal CT  Plan: Okay to proceed with endoscopy with anesthesia assistance  [facility] E  Pager [phone] After 5PM or if no answer call [phone]

## 2018-11-20 NOTE — Progress Notes (Signed)
PROGRESS NOTE                                                                                                                                                                                                             Patient Demographics:    Vincent Park, is a 63 y.o. male, DOB - October 17, 1955, ZOX:[id number]  Admit date - 10/25/2018   Admitting Physician Haydee Monica, MD  Outpatient Primary MD for the patient is Patient, No Pcp Per  LOS - 26  Outpatient Specialists:  Chief Complaint  Patient presents with  . Abdominal Pain       Brief Narrative   63 year old male with history of perforated peptic ulcer in 2003 status post surgical intervention, GERD presenting with progressive epigastric abdominal pain associated with vomiting.  CT of the abdomen pelvis showed high-grade small bowel obstruction with significant ascites and free intraperitoneal air and peritoneal enhancement. Patient admitted with possible perforated bowel and pneumoperitoneum.  Further work-up with abdominal MRI showed multiple intra-abdominal abscess with masslike nonspecific wall thickening involving the proximal stomach.  IR consulted and pain was placed at the site of abscess.  (4 initially and now down to 1 drain). Patient started on TPN with follow-up abdominal CT on 3/6 showing improvement of the intra-abdominal abscesses.   Subjective:   Patient seen after returning from endoscopy.  Denies any abdominal pain.   Assessment  & Plan :    Principal Problem: Pneumoperitoneum with intra-abdominal abscess. IR placed bilateral abdominal and flank drain x4 (2 on 2/21 and 2 on 2/28).  Now down to 1 drain.  Monitor output closely. EGD done today without any significant mass or perforation.  A small heel ulcer was found with adjacent mucosal nodularity and one raised area that was biopsied.  Plan on repeat CT scan later this week to assess for removal  of the right upper quadrant drain.  Can then determine continuation/duration of antibiotic after that. Switch PPI to once a day.    Active Problems:    SBO (small bowel obstruction) (HCC) Tolerating clears. Advance diet to full .  Surgery plan to advance diet and monitor.  Continue TPN for further nutritional support.      Protein-calorie malnutrition, severe (HCC) Cachexia (HCC) Continue TPN.  Tolerating clears.     AKI (acute kidney injury) (HCC) Secondary to dehydration.  Resolved.  Alcohol abuse No signs of withdrawal.  History of peptic ulcer disease EGD showed 1 healed ulcer.  Transition to once a day PPI.  Anemia of chronic disease H&H stable.  Monitor.  Transaminitis Mild.  Monitor while on TPN.     Code Status : Full code  Family Communication  : None at bedside  Disposition Plan  : Home pending clinical improvement  Barriers For Discharge : Active symptoms  Consults  : [place] surgery, IR, [facility]  Procedures  : CT abdomen pelvis, IR intra-abdominal drain placement, EGD on 3/10  DVT Prophylaxis  : Subcu heparin  Lab Results  Component Value Date   PLT 375 11/19/2018    Antibiotics  :    Anti-infectives (From admission, onward)   Start     Dose/Rate Route Frequency Ordered Stop   11/09/18 0600  ceFAZolin (ANCEF) IVPB 2g/100 mL premix     2 g 200 mL/hr over 30 Minutes Intravenous On call 11/08/18 1546 11/09/18 0900   10/26/18 0400  piperacillin-tazobactam (ZOSYN) IVPB 3.375 g     3.375 g 12.5 mL/hr over 240 Minutes Intravenous Every 8 hours 10/26/18 0245     10/25/18 1930  piperacillin-tazobactam (ZOSYN) IVPB 3.375 g     3.375 g 100 mL/hr over 30 Minutes Intravenous  Once 10/25/18 1928 10/25/18 2021        Objective:   Vitals:   11/20/18 0927 11/20/18 0930 11/20/18 0937 11/20/18 1336  BP:  106/63  (!) 149/69  Pulse: 89 88 88 (!) 113  Resp: 16 14 14 18   Temp:    98 F (36.7 C)  TempSrc:    Oral  SpO2: 100% 99% 97% 100%  Weight:       Height:        Wt Readings from Last 3 Encounters:  11/20/18 51.7 kg  10/10/18 64.1 kg     Intake/Output Summary (Last 24 hours) at 11/20/2018 1640 Last data filed at 11/20/2018 1448 Gross per 24 hour  Intake 2432.5 ml  Output 3775 ml  Net -1342.5 ml    Physical exam Elderly cachectic male, no acute distress HEENT: Pallor +, temporal wasting, supple neck, moist mucosa Chest: Clear bilaterally CVs: Normal S1-S2 GI: Soft, bowel sounds present, right upper quadrant drain with small mucopurulent liquid, nondistended and nontender Musculoskeletal: Warm, no edema     Data Review:    CBC Recent Labs  Lab 11/19/18 0357  WBC 6.0  HGB 9.6*  HCT 30.6*  PLT 375  MCV 104.8*  MCH 32.9  MCHC 31.4  RDW 15.6*  LYMPHSABS 1.5  MONOABS 0.5  EOSABS 0.1  BASOSABS 0.1    Chemistries  Recent Labs  Lab 11/14/18 0456 11/15/18 0342 11/17/18 0354 11/19/18 0357 11/20/18 0408  NA 134* 136 136 135 134*  K 4.1 4.0 4.1 4.1 4.1  CL 102 103 104 102 102  CO2 28 27 27 27 25   GLUCOSE 102* 97 106* 100* 101*  BUN 21 21 23 21 21   CREATININE 0.70 0.71 0.68 0.70 0.66  CALCIUM 8.7* 8.8* 8.6* 8.9 8.8*  MG 1.8 1.9 1.8 1.9  --   AST  --  31  --  49* 53*  ALT  --  32  --  53* 62*  ALKPHOS  --  252*  --  305* 304*  BILITOT  --  0.2*  --  0.4 0.2*   ------------------------------------------------------------------------------------------------------------------ Recent Labs    11/19/18 0357  TRIG 119    Lab Results  Component Value Date   HGBA1C 5.9 (H) 10/26/2018   ------------------------------------------------------------------------------------------------------------------ No results for input(s): TSH, T4TOTAL, T3FREE, THYROIDAB in the last 72 hours.  Invalid input(s): FREET3 ------------------------------------------------------------------------------------------------------------------ No results for input(s): VITAMINB12, FOLATE, FERRITIN, TIBC, IRON, RETICCTPCT in the  last 72 hours.  Coagulation profile No results for input(s): INR, PROTIME in the last 168 hours.  No results for input(s): DDIMER in the last 72 hours.  Cardiac Enzymes No results for input(s): CKMB, TROPONINI, MYOGLOBIN in the last 168 hours.  Invalid input(s): CK ------------------------------------------------------------------------------------------------------------------ No results found for: BNP  Inpatient Medications  Scheduled Meds: . feeding supplement  1 Container Oral TID BM  . feeding supplement (PRO-STAT SUGAR FREE 64)  30 mL Oral BID  . heparin injection (subcutaneous)  5,000 Units Subcutaneous Q8H  . pantoprazole (PROTONIX) IV  40 mg Intravenous Q12H  . sodium chloride flush  10-40 mL Intracatheter Q12H  . sodium chloride flush  5 mL Intracatheter Q8H  . sodium chloride flush  5 mL Intracatheter Q8H   Continuous Infusions: . sodium chloride 50 mL/hr at 11/20/18 1150  . sodium chloride Stopped (11/16/18 0548)  . piperacillin-tazobactam (ZOSYN)  IV 3.375 g (11/20/18 1448)  . TPN ADULT (ION) 80 mL/hr at 11/19/18 1807  . TPN ADULT (ION)     PRN Meds:.sodium chloride, Melatonin, sodium chloride flush  Micro Results No results found for this or any previous visit (from the past 240 hour(s)).  Radiology Reports Dg Chest 2 View  Result Date: 10/27/2018 CLINICAL DATA:  Dyspnea on exertion. EXAM: CHEST - 2 VIEW COMPARISON:  One view abdomen 10/26/2018.  Abdominal CT 10/25/2018. FINDINGS: 0920 hours. Right arm PICC extends to the superior cavoatrial junction. Nasogastric tube is in place, tip not visualized on the frontal examination. On the lateral view, the NG tube extends in the proximal stomach. The heart size and mediastinal contours are normal. There is minimal right base atelectasis. The lungs are otherwise clear. There is no pleural effusion or pneumothorax. IMPRESSION: Minimal right basilar atelectasis. No other active cardiopulmonary process. Electronically  Signed   By: Carey Bullocks M.D.   On: 10/27/2018 13:20   Dg Abd 1 View  Result Date: 11/08/2018 CLINICAL DATA:  Intra-abdominal free air EXAM: ABDOMEN - 1 VIEW COMPARISON:  November 03, 2018 FINDINGS: The bowel gas pattern is normal. Nasogastric tube is identified distal tip probably in the proximal stomach. Free air in the left upper abdomen is unchanged. Changed to in bilateral abdomen are unchanged. IMPRESSION: Persistent free air is not significantly changed. No bowel obstruction. Electronically Signed   By: Sherian Rein M.D.   On: 11/08/2018 07:34   Dg Abd 1 View  Result Date: 11/03/2018 CLINICAL DATA:  Encounter for nasogastric tube placement. EXAM: ABDOMEN - 1 VIEW COMPARISON:  10/26/2018 FINDINGS: Nasogastric tube in the upper abdomen. Catheter tip is likely in the proximal stomach. The tube side hole is at the GE junction. Bilateral abdominal pigtail drains. Pockets of gas in the left upper abdomen appear to represent persistent free air. Elevation of the right hemidiaphragm with probable right basilar atelectasis. IMPRESSION: Tip of the nasogastric tube is in the proximal stomach region. The side-hole is near the GE junction. Bilateral abdominal pigtail drains. Persistent free air in the left upper quadrant of the abdomen. Electronically Signed   By: Richarda Overlie M.D.   On: 11/03/2018 11:20   Mr Abdomen W Wo Contrast  Result Date: 11/02/2018 CLINICAL DATA:  Evaluate for malignancy. EXAM: MRI ABDOMEN WITHOUT AND WITH CONTRAST  TECHNIQUE: Multiplanar multisequence MR imaging of the abdomen was performed both before and after the administration of intravenous contrast. CONTRAST:  5 cc of Gadavist. COMPARISON:  10/25/2018 FINDINGS: Lower chest: Small right pleural effusion is new from previous exam. Hepatobiliary: Mild hepatic steatosis. No focal enhancing liver lesions identified. The gallbladder appears normal. The common bile duct measures 6 mm in maximum dimension. Pancreas: Normal appearance  of the pancreas. No inflammation mass or main duct dilatation. Spleen:  Within normal limits in size and appearance. Adrenals/Urinary Tract: Normal appearance of the adrenal glands. Small 8 mm cyst noted within the left kidney. No enhancing mass or hydronephrosis. Stomach/Bowel: Masslike thickening of the proximal wall of stomach is identified. This measures approximately 8.5 cm in length and has a thickness of 3.1 cm. This is nonspecific and although this may be due to gastritis underlying mass is not excluded. Persistent abnormal dilatation of small-bowel loops is again identified measuring up to 3.8 cm in diameter. Gaseous distension of the colon is noted as well. Vascular/Lymphatic: Normal appearance of the abdominal aorta. No aneurysm. No definitive adenopathy noted at this time. Other: There are multiple large peripherally enhancing fluid collections identified within both sides of the abdomen. Within the left abdomen fluid collection measures 15.1 by 8.3 by 21.1 cm. In the right hemiabdomen there is a large fluid collection measuring 8.3 x 14.8 by 21.0 cm. These appear more well defined with progressive mural enhancement compatible with mature fluid collections. There is progressive mass effect along the dome of liver, image 19/903. Continued foci of pneumoperitoneum also noted, for example overlying anterior left lobe of liver, image 32/901. Musculoskeletal: No suspicious bone lesions identified. IMPRESSION: 1. Large bilateral abdominal fluid collections are identified on today's exam. These appear increased in size with peripheral enhancement compatible with maturing fluid collections. There is also continued pneumoperitoneum. Findings are compatible with perforated viscus. Suspect multiple intra-abdominal abscesses. 2. There is nonspecific, masslike wall thickening involving the proximal stomach. Findings may be due to gastritis. Underlying mass would be difficult to exclude and correlation with endoscopic  inspection should be considered. 3. Persistent abnormal distension of small bowel loops. There is also gaseous distension of the colon. In the setting of perforated viscus with multiple intra-abdominal fluid collections assist a nonspecific finding and may reflect inflammatory ileus. Bowel obstruction can up be excluded. 4. New small right pleural effusion. Electronically Signed   By: Signa Kell M.D.   On: 11/02/2018 15:03   Ct Abdomen Pelvis W Contrast  Result Date: 11/16/2018 CLINICAL DATA:  Intra-abdominal infection and status post staged placement 4 separate percutaneous peritoneal drainage catheters. These drains all now combined have minimal output. EXAM: CT ABDOMEN AND PELVIS WITH CONTRAST TECHNIQUE: Multidetector CT imaging of the abdomen and pelvis was performed using the standard protocol following bolus administration of intravenous contrast. CONTRAST:  OMNIPAQUE IOHEXOL 300 MG/ML  SOLN COMPARISON:  11/08/2018 FINDINGS: Lower chest: No acute abnormality. Hepatobiliary: No focal liver abnormality is seen. No gallstones, gallbladder wall thickening, or biliary dilatation. Pancreas: Unremarkable. No pancreatic ductal dilatation or surrounding inflammatory changes. Spleen: Normal in size without focal abnormality. Adrenals/Urinary Tract: Adrenal glands are unremarkable. Kidneys are normal, without renal calculi, focal lesion, or hydronephrosis. Bladder is unremarkable. Stomach/Bowel: Nasogastric tube has been removed. There remains suggestion of some proximal gastric wall thickening without evidence of overt visible ulceration. Some free intraperitoneal air remains in the left upper quadrant/subphrenic region no evidence of bowel obstruction or significant ileus. Vascular/Lymphatic: No significant vascular findings are present. No  enlarged abdominal or pelvic lymph nodes. Reproductive: Prostate is unremarkable. Other: Drain 1 posterior to the liver with no residual fluid surrounding the drain.  Drain 2 in left lateral pericolic region with no fluid surrounding the drain. Drain 3 in anterior pelvic right lower quadrant with no fluid surrounding the drainage catheter. Drain 4 in left lower quadrant pelvis just superior to the bladder with no residual fluid surrounding the drain. The only undrained fluid collection is in the deep central pelvis measuring approximately 4.2 x 4.6 cm and demonstrating some decrease in size since the prior CT. Musculoskeletal: No acute or significant osseous findings. IMPRESSION: Significant improvement since the prior CT. All 4 drains have now effectively completely drained their respective abscess collections with no further fluid remaining around any of the drains. The only undrained fluid is a deep central pelvic abscess which has decreased in size since the prior CT. Electronically Signed   By: Irish Lack M.D.   On: 11/16/2018 09:33   Ct Abdomen Pelvis W Contrast  Result Date: 11/08/2018 CLINICAL DATA:  Status post percutaneous catheter drainage large bilateral flank retroperitoneal fluid collections on 11/02/2018. EXAM: CT ABDOMEN AND PELVIS WITH CONTRAST TECHNIQUE: Multidetector CT imaging of the abdomen and pelvis was performed using the standard protocol following bolus administration of intravenous contrast. CONTRAST:  OMNIPAQUE IOHEXOL 300 MG/ML  SOLN COMPARISON:  CT of the abdomen and pelvis on 10/25/2018 and MRI of the abdomen on 11/02/2018 FINDINGS: Lower chest: Mild atelectasis at the right lung base. Hepatobiliary: No focal liver abnormality is seen. No gallstones, gallbladder wall thickening, or biliary dilatation. Pancreas: Unremarkable. No pancreatic ductal dilatation or surrounding inflammatory changes. Spleen: Normal in size without focal abnormality. Adrenals/Urinary Tract: Adrenal glands are unremarkable. Kidneys are normal, without renal calculi, focal lesion, or hydronephrosis. Bladder is unremarkable. Stomach/Bowel: The stomach is  decompressed by a nasogastric catheter. There remains wall thickening involving the proximal stomach and body of the stomach. There remains some free intraperitoneal air in the left upper abdomen and left subphrenic space with overall decrease in free air since the prior CT on 10/25/2018. No small bowel obstruction or significant colonic ileus. Vascular/Lymphatic: No significant vascular findings are present. No enlarged abdominal or pelvic lymph nodes. Reproductive: Prostate is unremarkable. Other: Drainage catheter on the right extends up to the posterior margin of the lower liver. There is a small amount residual fluid inferior to the liver. Drainage catheter on the left extends into a residual large left lateral abdominal abscess measuring up to 5.8 x 10.3 cm transversely. This collection also extends into the left pelvis. Additional right lower quadrant anterior peritoneal abscess just deep to the abdominal wall measures approximately 8 cm in transverse with extension into the right pelvis. Additional deep pelvic collection anterior to the rectum measures approximately 4.2 x 5.8 cm. There are some other small areas of fluid interspersed between small bowel loops in the central abdomen and throughout the pelvis. Musculoskeletal: No acute or significant osseous findings. IMPRESSION: 1. Decrease in free intraperitoneal air in the left upper abdomen and left subphrenic space. 2. Affective drainage of right lateral abdominal fluid collection by catheter placement likely in the retroperitoneal space. 3. The left lateral abdominal fluid collection is still quite large after catheter placement and it is recommended that the drain be flushed appropriately. If this does not result in further fluid return, this catheter may need to be exchanged and upsized as well as slightly repositioned in the cavity. 4. Two additional undrained marginated fluid collections  in the right lower quadrant and deep central pelvis. The right  lower quadrant collection that extends into the right pelvis is just deep to the abdominal wall and would be amenable to percutaneous catheter drainage, if indicated. The deep pelvic collection is not currently approachable percutaneously. Electronically Signed   By: Irish Lack M.D.   On: 11/08/2018 10:50   Ct Abdomen Pelvis W Contrast  Result Date: 10/25/2018 CLINICAL DATA:  63 y/o male with abd 'burning' x3 days. EXAM: CT ABDOMEN AND PELVIS WITH CONTRAST TECHNIQUE: Multidetector CT imaging of the abdomen and pelvis was performed using the standard protocol following bolus administration of intravenous contrast. CONTRAST:  ISOVUE-300 IOPAMIDOL (ISOVUE-300) INJECTION 61% COMPARISON:  None. FINDINGS: Lower chest: No acute abnormality. Hepatobiliary: There hepatic fluid. No focal liver lesions. Gallbladder is present. Pancreas: Unremarkable. No pancreatic ductal dilatation or surrounding inflammatory changes. Spleen: Normal in size without focal abnormality. Adrenals/Urinary Tract: Adrenal glands are normal. Small low-attenuation lesion within the midpole of the LEFT kidney is less than 1 centimeter not fully characterize. There is no hydronephrosis. The bladder and visualized portion of the urethra are normal. Stomach/Bowel: The stomach is normal in appearance. 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 There is marked dilatation of small bowel loops. The most distal ileal loops are not dilated. There is significant wall thickening associated with central bowel loops and multiple angular changes in caliber, raising the question of adhesions. Within a mid to distal small bowel loop in the RIGHT central pelvis, there is radiopaque intraluminal structure measuring 10 millimeters and possibly representing a small ingested bone fragment. The colon is decompressed and otherwise normal in appearance. There is significant ascites and free intraperitoneal air  with air-fluid level in the LEFT UPPER QUADRANT. There is enhancement of the peritoneum. Vascular/Lymphatic: No significant vascular findings are present. No enlarged abdominal or pelvic lymph nodes. Reproductive: Prostate is unremarkable. Other: Large volume ascites, free intraperitoneal air, and peritoneal enhancement. Cachexia. Musculoskeletal: No acute or significant osseous findings. IMPRESSION: 1. High-grade small bowel obstruction.  Thickened small bowel loops. 2. Question of ingested foreign body within a distal small bowel loop in the RIGHT hemipelvis. See above. 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 4. Significant ascites, free intraperitoneal air, and peritoneal enhancement. Critical Value/emergent results were called by telephone at the time of interpretation on 10/25/2018 at 7:13 pm to provider [facility] , who verbally acknowledged these results. Electronically Signed   By: Norva Pavlov M.D.   On: 10/25/2018 19:18   Ir Catheter Tube Change  Result Date: 11/09/2018 INDICATION: 63 year old male with florid infectious peritonitis and extensive intraperitoneal abscesses. He was previously treated by placement of bilateral percutaneous drainage catheters on 11/02/2018. Recurrent CT imaging demonstrates ineffective drainage. He presents today for drain exchange, up size and placement of additional drainage catheters. EXAM: 1. Drain injection, exchange and up size of the existing right upper quadrant drainage catheter. 2. Placement of a new right lower quadrant drainage catheter using ultrasound and fluoroscopic guidance. 3. Drain injection, exchange and up size of the existing left upper quadrant drainage catheter. 4. Placement of a new left lower quadrant drainage catheter using ultrasound and fluoroscopic guidance. MEDICATIONS: 2 g Ancef ANESTHESIA/SEDATION: Fentanyl 100 mcg IV; Versed 3 mg IV Moderate Sedation Time:  40 minutes The patient was continuously monitored during the procedure by  the interventional radiology nurse under my direct supervision. FLUOROSCOPY TIME:  3 minutes 24 seconds, 10 mGy COMPLICATIONS: None immediate. PROCEDURE:  Informed written consent was obtained from the patient after a thorough discussion of the procedural risks, benefits and alternatives. All questions were addressed. Maximal Sterile Barrier Technique was utilized including caps, mask, sterile gowns, sterile gloves, sterile drape, hand hygiene and skin antiseptic. A timeout was performed prior to the initiation of the procedure. Attention was first turned to the existing right upper quadrant drainage catheter which enters the peritoneal cavity from the right mid abdomen. A gentle hand injection of contrast material was performed. There is a persistent abscess cavity around the tip of the catheter. The injected contrast material extends inferiorly along the right abdominal wall nearly to the skin entry site. The decision was made to proceed with drain exchange and up size. The drainage type also be transition to a biliary drain to facilitate drainage of the entire length of the collection. The retention suture was cut and released. The catheter was transected and removed over an Amplatz wire. The skin tract was dilated to 60 [place] and a 78 [place] biliary drainage catheter was advanced over the wire and formed. Injection through the biliary drain demonstrates its location within the right abdominal fluid collection. The drainage catheter was secured to the skin with 0 Prolene suture and connected to JP bulb suction. Attention was next turned to the right lower quadrant where there was a known fluid collection on the prior CT scan. Ultrasound reveals a complex fluid pocket consistent with peritoneal fluid. A skin entry site was marked. Local anesthesia was attained by infiltration with 1% lidocaine. A small dermatotomy was made. Under real-time sonographic guidance, an 18 gauge single wall needle was advanced into the  fluid collection. A 0.018 wire was then advanced through the needle and the needle was removed. The skin tract was dilated to 64 [place] and a [place] 14 [place] percutaneous drain was advanced into the fluid collection and formed. A hand injection of contrast material reveals that the fluid collection extends inferiorly into the right inguinal recess. Therefore, the catheter was manipulated into the more dependent aspect in the right inguinal recess. The catheter was secured to the skin with 0 Prolene suture and connected to JP bulb suction. Attention was next turned to the existing left upper quadrant drain. Again, a hand injection of contrast material was performed. This demonstrates a large persistent abscess cavity. The internal fluid appears quite thick. The decision was made to proceed with up sizing of this drainage catheter to a larger drain to facilitate drainage. The retention suture was cut. The catheter was transected and removed over an Amplatz wire. The skin tract was dilated to 21 [place] and a 16 [place] Thal drainage catheter was advanced over the wire and formed in the left upper quadrant. The catheter was connected to JP bulb suction and secured to the skin with 0 Prolene suture. This fluid collection is known to extend significantly along the left abdominal wall and into the pelvis. Therefore, ultrasound was again used to evaluate the fluid collection. There is a large fluid collection in the left abdomen. A suitable skin entry site was selected and marked. Local anesthesia was attained by infiltration with 1% lidocaine. A small dermatotomy was made. Under real-time sonographic guidance, an 18 gauge single wall needle was used to puncture the fluid collection. An Amplatz wire was advanced in an inferior direction toward the left inguinal recess. The 18 gauge needle was removed. The skin tract was dilated to 2 [place] and a 45 [place] biliary drain was advanced over the wire  and positioned in the  anatomic pelvis. Contrast injection confirms that the catheter is located within the peritoneal fluid. The catheter was flushed and connected to JP bulb suction and secured to the skin with 0 Prolene suture. IMPRESSION: 1. Exchange and up size of existing right upper quadrant drainage catheter to a 14 [place] biliary drain. 2. Placement of a new right lower quadrant 14 French all-purpose drainage catheter. 3. Exchange and up size of existing left upper quadrant drainage catheter to a 16 [place] Thal drain. 4. Placement of a new left lower quadrant 14 French biliary drainage catheter. PLAN: 1. Maintain all tubes to JP bulb suction. 2. All tubes should be flushed at least once per shift. 3. Once drain output has diminished or ceased, additional CT imaging of the abdomen and pelvis with intravenous contrast should be obtained prior to drain removal. Signed, Sterling Big, MD, RPVI [facility] Radiology Electronically Signed   By: Malachy Moan M.D.   On: 11/09/2018 10:43   Ir Catheter Tube Change  Result Date: 11/09/2018 INDICATION: 63 year old male with florid infectious peritonitis and extensive intraperitoneal abscesses. He was previously treated by placement of bilateral percutaneous drainage catheters on 11/02/2018. Recurrent CT imaging demonstrates ineffective drainage. He presents today for drain exchange, up size and placement of additional drainage catheters. EXAM: 1. Drain injection, exchange and up size of the existing right upper quadrant drainage catheter. 2. Placement of a new right lower quadrant drainage catheter using ultrasound and fluoroscopic guidance. 3. Drain injection, exchange and up size of the existing left upper quadrant drainage catheter. 4. Placement of a new left lower quadrant drainage catheter using ultrasound and fluoroscopic guidance. MEDICATIONS: 2 g Ancef ANESTHESIA/SEDATION: Fentanyl 100 mcg IV; Versed 3 mg IV Moderate  Sedation Time:  40 minutes The patient was continuously monitored during the procedure by the interventional radiology nurse under my direct supervision. FLUOROSCOPY TIME:  3 minutes 24 seconds, 10 mGy COMPLICATIONS: None immediate. PROCEDURE: Informed written consent was obtained from the patient after a thorough discussion of the procedural risks, benefits and alternatives. All questions were addressed. Maximal Sterile Barrier Technique was utilized including caps, mask, sterile gowns, sterile gloves, sterile drape, hand hygiene and skin antiseptic. A timeout was performed prior to the initiation of the procedure. Attention was first turned to the existing right upper quadrant drainage catheter which enters the peritoneal cavity from the right mid abdomen. A gentle hand injection of contrast material was performed. There is a persistent abscess cavity around the tip of the catheter. The injected contrast material extends inferiorly along the right abdominal wall nearly to the skin entry site. The decision was made to proceed with drain exchange and up size. The drainage type also be transition to a biliary drain to facilitate drainage of the entire length of the collection. The retention suture was cut and released. The catheter was transected and removed over an Amplatz wire. The skin tract was dilated to 45 [place] and a 42 [place] biliary drainage catheter was advanced over the wire and formed. Injection through the biliary drain demonstrates its location within the right abdominal fluid collection. The drainage catheter was secured to the skin with 0 Prolene suture and connected to JP bulb suction. Attention was next turned to the right lower quadrant where there was a known fluid collection on the prior CT scan. Ultrasound reveals a complex fluid pocket consistent with peritoneal fluid. A skin entry site was marked. Local anesthesia was attained by infiltration with  1% lidocaine. A small dermatotomy was made.  Under real-time sonographic guidance, an 18 gauge single wall needle was advanced into the fluid collection. A 0.018 wire was then advanced through the needle and the needle was removed. The skin tract was dilated to 80 [place] and a Cook 14 [place] percutaneous drain was advanced into the fluid collection and formed. A hand injection of contrast material reveals that the fluid collection extends inferiorly into the right inguinal recess. Therefore, the catheter was manipulated into the more dependent aspect in the right inguinal recess. The catheter was secured to the skin with 0 Prolene suture and connected to JP bulb suction. Attention was next turned to the existing left upper quadrant drain. Again, a hand injection of contrast material was performed. This demonstrates a large persistent abscess cavity. The internal fluid appears quite thick. The decision was made to proceed with up sizing of this drainage catheter to a larger drain to facilitate drainage. The retention suture was cut. The catheter was transected and removed over an Amplatz wire. The skin tract was dilated to 73 [place] and a 16 [place] Thal drainage catheter was advanced over the wire and formed in the left upper quadrant. The catheter was connected to JP bulb suction and secured to the skin with 0 Prolene suture. This fluid collection is known to extend significantly along the left abdominal wall and into the pelvis. Therefore, ultrasound was again used to evaluate the fluid collection. There is a large fluid collection in the left abdomen. A suitable skin entry site was selected and marked. Local anesthesia was attained by infiltration with 1% lidocaine. A small dermatotomy was made. Under real-time sonographic guidance, an 18 gauge single wall needle was used to puncture the fluid collection. An Amplatz wire was advanced in an inferior direction toward the left inguinal recess. The 18 gauge needle was removed. The skin tract was dilated to 73  [place] and a 58 [place] biliary drain was advanced over the wire and positioned in the anatomic pelvis. Contrast injection confirms that the catheter is located within the peritoneal fluid. The catheter was flushed and connected to JP bulb suction and secured to the skin with 0 Prolene suture. IMPRESSION: 1. Exchange and up size of existing right upper quadrant drainage catheter to a 14 [place] biliary drain. 2. Placement of a new right lower quadrant 14 French all-purpose drainage catheter. 3. Exchange and up size of existing left upper quadrant drainage catheter to a 16 [place] Thal drain. 4. Placement of a new left lower quadrant 14 French biliary drainage catheter. PLAN: 1. Maintain all tubes to JP bulb suction. 2. All tubes should be flushed at least once per shift. 3. Once drain output has diminished or ceased, additional CT imaging of the abdomen and pelvis with intravenous contrast should be obtained prior to drain removal. Signed, Sterling Big, MD, RPVI [facility] Radiology Electronically Signed   By: Malachy Moan M.D.   On: 11/09/2018 10:43   Ir US Guide Bx Asp/drain  Result Date: 11/09/2018 INDICATION: 63 year old male with florid infectious peritonitis and extensive intraperitoneal abscesses. He was previously treated by placement of bilateral percutaneous drainage catheters on 11/02/2018. Recurrent CT imaging demonstrates ineffective drainage. He presents today for drain exchange, up size and placement of additional drainage catheters. EXAM: 1. Drain injection, exchange and up size of the existing right upper quadrant drainage catheter. 2. Placement of a new right lower quadrant drainage catheter using ultrasound and fluoroscopic guidance. 3.  Drain injection, exchange and up size of the existing left upper quadrant drainage catheter. 4. Placement of a new left lower quadrant drainage catheter using ultrasound and fluoroscopic guidance.  MEDICATIONS: 2 g Ancef ANESTHESIA/SEDATION: Fentanyl 100 mcg IV; Versed 3 mg IV Moderate Sedation Time:  40 minutes The patient was continuously monitored during the procedure by the interventional radiology nurse under my direct supervision. FLUOROSCOPY TIME:  3 minutes 24 seconds, 10 mGy COMPLICATIONS: None immediate. PROCEDURE: Informed written consent was obtained from the patient after a thorough discussion of the procedural risks, benefits and alternatives. All questions were addressed. Maximal Sterile Barrier Technique was utilized including caps, mask, sterile gowns, sterile gloves, sterile drape, hand hygiene and skin antiseptic. A timeout was performed prior to the initiation of the procedure. Attention was first turned to the existing right upper quadrant drainage catheter which enters the peritoneal cavity from the right mid abdomen. A gentle hand injection of contrast material was performed. There is a persistent abscess cavity around the tip of the catheter. The injected contrast material extends inferiorly along the right abdominal wall nearly to the skin entry site. The decision was made to proceed with drain exchange and up size. The drainage type also be transition to a biliary drain to facilitate drainage of the entire length of the collection. The retention suture was cut and released. The catheter was transected and removed over an Amplatz wire. The skin tract was dilated to 37 [place] and a 10 [place] biliary drainage catheter was advanced over the wire and formed. Injection through the biliary drain demonstrates its location within the right abdominal fluid collection. The drainage catheter was secured to the skin with 0 Prolene suture and connected to JP bulb suction. Attention was next turned to the right lower quadrant where there was a known fluid collection on the prior CT scan. Ultrasound reveals a complex fluid pocket consistent with peritoneal fluid. A skin entry site was marked. Local  anesthesia was attained by infiltration with 1% lidocaine. A small dermatotomy was made. Under real-time sonographic guidance, an 18 gauge single wall needle was advanced into the fluid collection. A 0.018 wire was then advanced through the needle and the needle was removed. The skin tract was dilated to 33 [place] and a Cook 14 [place] percutaneous drain was advanced into the fluid collection and formed. A hand injection of contrast material reveals that the fluid collection extends inferiorly into the right inguinal recess. Therefore, the catheter was manipulated into the more dependent aspect in the right inguinal recess. The catheter was secured to the skin with 0 Prolene suture and connected to JP bulb suction. Attention was next turned to the existing left upper quadrant drain. Again, a hand injection of contrast material was performed. This demonstrates a large persistent abscess cavity. The internal fluid appears quite thick. The decision was made to proceed with up sizing of this drainage catheter to a larger drain to facilitate drainage. The retention suture was cut. The catheter was transected and removed over an Amplatz wire. The skin tract was dilated to 59 [place] and a 16 [place] Thal drainage catheter was advanced over the wire and formed in the left upper quadrant. The catheter was connected to JP bulb suction and secured to the skin with 0 Prolene suture. This fluid collection is known to extend significantly along the left abdominal wall and into the pelvis. Therefore, ultrasound was again used to evaluate the fluid collection. There is a large fluid collection in the left abdomen.  A suitable skin entry site was selected and marked. Local anesthesia was attained by infiltration with 1% lidocaine. A small dermatotomy was made. Under real-time sonographic guidance, an 18 gauge single wall needle was used to puncture the fluid collection. An Amplatz wire was advanced in an inferior direction toward the  left inguinal recess. The 18 gauge needle was removed. The skin tract was dilated to 44 [place] and a 9 [place] biliary drain was advanced over the wire and positioned in the anatomic pelvis. Contrast injection confirms that the catheter is located within the peritoneal fluid. The catheter was flushed and connected to JP bulb suction and secured to the skin with 0 Prolene suture. IMPRESSION: 1. Exchange and up size of existing right upper quadrant drainage catheter to a 14 [place] biliary drain. 2. Placement of a new right lower quadrant 14 French all-purpose drainage catheter. 3. Exchange and up size of existing left upper quadrant drainage catheter to a 16 [place] Thal drain. 4. Placement of a new left lower quadrant 14 French biliary drainage catheter. PLAN: 1. Maintain all tubes to JP bulb suction. 2. All tubes should be flushed at least once per shift. 3. Once drain output has diminished or ceased, additional CT imaging of the abdomen and pelvis with intravenous contrast should be obtained prior to drain removal. Signed, Sterling Big, MD, RPVI [facility] Radiology Electronically Signed   By: Malachy Moan M.D.   On: 11/09/2018 10:43   Ir US Guide Bx Asp/drain  Result Date: 11/09/2018 INDICATION: 63 year old male with florid infectious peritonitis and extensive intraperitoneal abscesses. He was previously treated by placement of bilateral percutaneous drainage catheters on 11/02/2018. Recurrent CT imaging demonstrates ineffective drainage. He presents today for drain exchange, up size and placement of additional drainage catheters. EXAM: 1. Drain injection, exchange and up size of the existing right upper quadrant drainage catheter. 2. Placement of a new right lower quadrant drainage catheter using ultrasound and fluoroscopic guidance. 3. Drain injection, exchange and up size of the existing left upper quadrant drainage catheter. 4. Placement of a new  left lower quadrant drainage catheter using ultrasound and fluoroscopic guidance. MEDICATIONS: 2 g Ancef ANESTHESIA/SEDATION: Fentanyl 100 mcg IV; Versed 3 mg IV Moderate Sedation Time:  40 minutes The patient was continuously monitored during the procedure by the interventional radiology nurse under my direct supervision. FLUOROSCOPY TIME:  3 minutes 24 seconds, 10 mGy COMPLICATIONS: None immediate. PROCEDURE: Informed written consent was obtained from the patient after a thorough discussion of the procedural risks, benefits and alternatives. All questions were addressed. Maximal Sterile Barrier Technique was utilized including caps, mask, sterile gowns, sterile gloves, sterile drape, hand hygiene and skin antiseptic. A timeout was performed prior to the initiation of the procedure. Attention was first turned to the existing right upper quadrant drainage catheter which enters the peritoneal cavity from the right mid abdomen. A gentle hand injection of contrast material was performed. There is a persistent abscess cavity around the tip of the catheter. The injected contrast material extends inferiorly along the right abdominal wall nearly to the skin entry site. The decision was made to proceed with drain exchange and up size. The drainage type also be transition to a biliary drain to facilitate drainage of the entire length of the collection. The retention suture was cut and released. The catheter was transected and removed over an Amplatz wire. The skin tract was dilated to 37 [place] and a 23 [place] biliary drainage catheter was advanced over  the wire and formed. Injection through the biliary drain demonstrates its location within the right abdominal fluid collection. The drainage catheter was secured to the skin with 0 Prolene suture and connected to JP bulb suction. Attention was next turned to the right lower quadrant where there was a known fluid collection on the prior CT scan. Ultrasound reveals a complex  fluid pocket consistent with peritoneal fluid. A skin entry site was marked. Local anesthesia was attained by infiltration with 1% lidocaine. A small dermatotomy was made. Under real-time sonographic guidance, an 18 gauge single wall needle was advanced into the fluid collection. A 0.018 wire was then advanced through the needle and the needle was removed. The skin tract was dilated to 38 [place] and a Cook 14 [place] percutaneous drain was advanced into the fluid collection and formed. A hand injection of contrast material reveals that the fluid collection extends inferiorly into the right inguinal recess. Therefore, the catheter was manipulated into the more dependent aspect in the right inguinal recess. The catheter was secured to the skin with 0 Prolene suture and connected to JP bulb suction. Attention was next turned to the existing left upper quadrant drain. Again, a hand injection of contrast material was performed. This demonstrates a large persistent abscess cavity. The internal fluid appears quite thick. The decision was made to proceed with up sizing of this drainage catheter to a larger drain to facilitate drainage. The retention suture was cut. The catheter was transected and removed over an Amplatz wire. The skin tract was dilated to 41 [place] and a 16 [place] Thal drainage catheter was advanced over the wire and formed in the left upper quadrant. The catheter was connected to JP bulb suction and secured to the skin with 0 Prolene suture. This fluid collection is known to extend significantly along the left abdominal wall and into the pelvis. Therefore, ultrasound was again used to evaluate the fluid collection. There is a large fluid collection in the left abdomen. A suitable skin entry site was selected and marked. Local anesthesia was attained by infiltration with 1% lidocaine. A small dermatotomy was made. Under real-time sonographic guidance, an 18 gauge single wall needle was used to puncture the  fluid collection. An Amplatz wire was advanced in an inferior direction toward the left inguinal recess. The 18 gauge needle was removed. The skin tract was dilated to 52 [place] and a 37 [place] biliary drain was advanced over the wire and positioned in the anatomic pelvis. Contrast injection confirms that the catheter is located within the peritoneal fluid. The catheter was flushed and connected to JP bulb suction and secured to the skin with 0 Prolene suture. IMPRESSION: 1. Exchange and up size of existing right upper quadrant drainage catheter to a 14 [place] biliary drain. 2. Placement of a new right lower quadrant 14 French all-purpose drainage catheter. 3. Exchange and up size of existing left upper quadrant drainage catheter to a 16 [place] Thal drain. 4. Placement of a new left lower quadrant 14 French biliary drainage catheter. PLAN: 1. Maintain all tubes to JP bulb suction. 2. All tubes should be flushed at least once per shift. 3. Once drain output has diminished or ceased, additional CT imaging of the abdomen and pelvis with intravenous contrast should be obtained prior to drain removal. Signed, Sterling Big, MD, RPVI [facility] Radiology Electronically Signed   By: Malachy Moan M.D.   On: 11/09/2018 10:43   Ir US Guide Bx Asp/drain  Result Date: 11/02/2018 INDICATION: Bilateral intra-abdominal peritoneal fluid collections EXAM: Ultrasound bilateral abdominal flank abscess drains MEDICATIONS: The patient is currently admitted to the hospital and receiving intravenous antibiotics. The antibiotics were administered within an appropriate time frame prior to the initiation of the procedure. ANESTHESIA/SEDATION: Fentanyl 100 mcg IV; Versed 2.0 mg IV Moderate Sedation Time:  13 minutes The patient was continuously monitored during the procedure by the interventional radiology nurse under my direct supervision. COMPLICATIONS: None. PROCEDURE:  Informed written consent was obtained from the patient after a thorough discussion of the procedural risks, benefits and alternatives. All questions were addressed. Maximal Sterile Barrier Technique was utilized including caps, mask, sterile gowns, sterile gloves, sterile drape, hand hygiene and skin antiseptic. A timeout was performed prior to the initiation of the procedure. Previous imaging reviewed. Ultrasound performed. The complex bilateral flank abdominal fluid collection or localized and marked. Under sterile conditions and local anesthesia, ultrasound needle access performed of the bilateral flank abdominal fluid collections with 18 gauge 15 cm needles. Needle positions confirmed with ultrasound. Images obtained for documentation. Amplatz guidewires inserted followed by bilateral 10 French drains. Drain catheter positions confirmed with ultrasound. Syringe aspiration yielded purulent fluid bilaterally compatible with peritonitis and intra-abdominal pus. Catheter secured with Prolene sutures and connected to external gravity drainage bags. Approximately 1 L of purulent fluid drained from each catheter. IMPRESSION: Successful ultrasound bilateral abdominal flank abscess drain placements. Purulent fluid removed. Sample sent for culture and cytology. Electronically Signed   By: Judie PetitM.  Shick M.D.   On: 11/02/2018 16:52   Ir [place] Guide Bx Asp/drain  Result Date: 11/02/2018 INDICATION: Bilateral intra-abdominal peritoneal fluid collections EXAM: Ultrasound bilateral abdominal flank abscess drains MEDICATIONS: The patient is currently admitted to the hospital and receiving intravenous antibiotics. The antibiotics were administered within an appropriate time frame prior to the initiation of the procedure. ANESTHESIA/SEDATION: Fentanyl 100 mcg IV; Versed 2.0 mg IV Moderate Sedation Time:  13 minutes The patient was continuously monitored during the procedure by the interventional radiology nurse under my direct  supervision. COMPLICATIONS: None. PROCEDURE: Informed written consent was obtained from the patient after a thorough discussion of the procedural risks, benefits and alternatives. All questions were addressed. Maximal Sterile Barrier Technique was utilized including caps, mask, sterile gowns, sterile gloves, sterile drape, hand hygiene and skin antiseptic. A timeout was performed prior to the initiation of the procedure. Previous imaging reviewed. Ultrasound performed. The complex bilateral flank abdominal fluid collection or localized and marked. Under sterile conditions and local anesthesia, ultrasound needle access performed of the bilateral flank abdominal fluid collections with 18 gauge 15 cm needles. Needle positions confirmed with ultrasound. Images obtained for documentation. Amplatz guidewires inserted followed by bilateral 10 French drains. Drain catheter positions confirmed with ultrasound. Syringe aspiration yielded purulent fluid bilaterally compatible with peritonitis and intra-abdominal pus. Catheter secured with Prolene sutures and connected to external gravity drainage bags. Approximately 1 L of purulent fluid drained from each catheter. IMPRESSION: Successful ultrasound bilateral abdominal flank abscess drain placements. Purulent fluid removed. Sample sent for culture and cytology. Electronically Signed   By: Judie PetitM.  Shick M.D.   On: 11/02/2018 16:52   Dg Abd Portable 1v  Result Date: 10/26/2018 CLINICAL DATA:  NG tube placement EXAM: PORTABLE ABDOMEN - 1 VIEW COMPARISON:  CT 10/25/2018 FINDINGS: NG tube tip is in the mid stomach. Dilated centralized small bowel loops compatible with small bowel obstruction ascites as seen on prior CT. IMPRESSION: NG tube tip in the mid stomach. Electronically Signed   By:  Charlett Nose M.D.   On: 10/26/2018 02:51   Dg Kayleen Memos W Single Cm (sol Or Thin Ba)  Result Date: 10/31/2018 CLINICAL DATA:  63 year old male inpatient with remote history of repair of perforated  gastric ulcer, presents with pneumoperitoneum and clinical concern for perforated gastric ulcer or gastric outlet obstruction. EXAM: WATER SOLUBLE UPPER GI SERIES TECHNIQUE: Single-column upper GI series was performed using water soluble contrast. CONTRAST:  Water-soluble oral contrast. COMPARISON:  10/26/2018 abdominal radiographs and 10/25/2018 CT abdomen/pelvis. FLUOROSCOPY TIME:  Fluoroscopy Time:  2 minutes 6 seconds Radiation Exposure Index (if provided by the fluoroscopic device): 33.9 mGy Number of Acquired Spot Images: 6 FINDINGS: Enteric tube terminates in proximal stomach with side port at the esophagogastric junction. Free intraperitoneal air is noted in the left upper quadrant on the scout radiograph. Mildly dilated small bowel loops are noted in the central abdomen, similar to decreased from prior abdominal radiograph. Grossly normal esophagus on limited views. Oral contrast transits the stomach into the duodenum and proximal small bowel without delay. An ulcer is identified in the anterior proximal body of the stomach. No contrast leak from the stomach into the peritoneal cavity was elicited. There is irregular wall thickening in the anterior proximal gastric wall surrounding the ulcer site. There is narrowing of the body of the stomach. Incidentally noted small bowel malrotation, with the duodenal jejunal junction to the right of the spine. Proximal small bowel loops are mildly dilated. IMPRESSION: 1. Gastric ulcer identified in the anterior proximal body of the stomach, with surrounding irregular gastric wall thickening, cannot exclude a malignant ulcer. Persistent free intraperitoneal air in the left upper quadrant surrounding the ulcer site. No water-soluble contrast leak from the stomach into the peritoneal cavity detected. 2. No evidence of gastric outlet obstruction. Nonspecific narrowing of the body of the stomach. 3. Incidental small bowel malrotation. Proximal small bowel dilatation is  unchanged from recent CT and abdominal radiograph, probably due to ileus due to peritonitis. These results were called by telephone at the time of interpretation on 10/31/2018 at 11:41 am to Dr. Abigail Miyamoto , who verbally acknowledged these results. Electronically Signed   By: Delbert Phenix M.D.   On: 10/31/2018 12:00   [place] Ekg Site Rite  Result Date: 10/26/2018 If Site Rite image not attached, placement could not be confirmed due to current cardiac rhythm.   Time Spent in minutes  25   Lavora Brisbon M.D on 11/20/2018 at 4:40 PM  Between 7am to 7pm - Pager - [phone]  After 7pm go to [email or web address] - password [facility]  [facility] -  Office  [phone]

## 2018-11-20 NOTE — Transfer of Care (Signed)
Immediate Anesthesia Transfer of Care Note  Patient: Vincent Park  Procedure(s) Performed: Procedure(s): ESOPHAGOGASTRODUODENOSCOPY (EGD) WITH PROPOFOL (N/A)  Patient Location: PACU  Anesthesia Type:MAC  Level of Consciousness: Patient easily awoken, sedated, comfortable, cooperative, following commands, responds to stimulation.   Airway & Oxygen Therapy: Patient spontaneously breathing, ventilating well, oxygen via simple oxygen mask.  Post-op Assessment: Report given to PACU RN, vital signs reviewed and stable, moving all extremities.   Post vital signs: Reviewed and stable.  Complications: No apparent anesthesia complications Last Vitals:  Vitals Value Taken Time  BP 94/59 11/20/2018  9:20 AM  Temp    Pulse 89 11/20/2018  9:20 AM  Resp 10 11/20/2018  9:20 AM  SpO2 100 % 11/20/2018  9:20 AM  Vitals shown include unvalidated device data.  Last Pain:  Vitals:   11/20/18 0750  TempSrc: Oral  PainSc: 0-No pain         Complications: No apparent anesthesia complications

## 2018-11-20 NOTE — Progress Notes (Signed)
PHARMACY - ADULT TOTAL PARENTERAL NUTRITION CONSULT NOTE   Pharmacy Consult for TPN Indication: bowel rest, severe malnutrition  Patient Measurements: Height: 6' 2"  (188 cm) Weight: 113 lb 15.7 oz (51.7 kg) IBW/kg (Calculated) : 82.2 TPN AdjBW (KG): 54 Body mass index is 14.63 kg/m. Usual Weight: unknown as patient is poor historian, but reports significant weight loss over the past year  HPI: 9 yoM with PMH PUD with perforated gastric ulcer s/p repair in 2003, GERD presents with worsening chronic abdominal pain and associated weight loss. Cachectic appearing. CT reveals ascites with concern for SBO vs recurrent ulcer disease. Pt is NPO. Consulted by surgery for TPN management. Pt will require nutritional support prior to any surgical intervention.  Current Nutrition: NPO IVF: NS at 50 mL/hr   Central access: Double lumen PICC placed 2/14 TPN start date: 2/14  Significant events:  2/21: 2 abdominal flank drains placed 2/27: KUB w/ persistent free air, CT scan w/ persistent abd fluid collection plus 2 additional fluid collections RLQ anda deep central pelvis 2/28: to IR for drain exchanges/upsize x 2, 2 additional drains placed 3/3: per notes, pt not candidate for surgery right now, needs more bowel rest, needs EGD closer to when is surgical candidate, to reevaluate next week- still with free air.  3/4: NG tube removed 3/5: started CLD, supplements per dietary 3/6: 3 drains removed. Per CCS, patient tolerating CLD. Abdominal CT: "all 4 drain sites effectively drained except for a deep central pelvic abscess which is decreased" 3/10: Planning for EGD today   ASSESSMENT                                                                                                Today, 11/20/2018:   Glucose 101 (goal 100-150). BG check daily with AM labs only due to well controlled BG on TPN  Electrolytes - stable WNL. (Na 134)  Renal - SCr, bicarb, BUN wnl, UOP 4 L, 25 mL total per 1  drain  Hepatic - Alk Phos (3/9) continues to rise slowly - stable; LFTs slightly elevated; albumin remains low.  TGs - 119 (3/9) - WNL  Prealbumin - 24.8 (3/9) WNL  NUTRITIONAL GOALS                                                                                 RD recs (per note on 3/5): Kcal: 1900 - 2100 grams Protein: 95 - 115 g Fluid: >/= 1.9 L/day  TPN at goal rate of 80 mL/hr provides 106 g protein, 288 g dextrose, 1978 kcal; meeting 100% of patient needs.  PLAN  At 1800 today:  Continue TPN at goal rate of 80 mL/hr providing 100% of patient needs with 1978 kcal and 106 g protein  Electrolytes: no changes to formulation  No SSI needed  Continue IVF per MD. NaCl @ 50 mL/hr.  TPN lab panels on Mondays & Thursdays  Lenis Noon, PharmD 11/20/18 7:28 AM

## 2018-11-20 NOTE — Anesthesia Preprocedure Evaluation (Signed)
Anesthesia Evaluation  Patient identified by MRN, date of birth, ID band Patient awake    Reviewed: Allergy & Precautions, NPO status , Patient's Chart, lab work & pertinent test results  Airway Mallampati: II  TM Distance: >3 FB Neck ROM: Full    Dental no notable dental hx.    Pulmonary neg pulmonary ROS, Current Smoker,    breath sounds clear to auscultation + decreased breath sounds      Cardiovascular negative cardio ROS Normal cardiovascular exam Rhythm:Regular Rate:Normal     Neuro/Psych negative neurological ROS  negative psych ROS   GI/Hepatic PUD, GERD  ,(+)     substance abuse  alcohol use,   Endo/Other  negative endocrine ROS  Renal/GU negative Renal ROS  negative genitourinary   Musculoskeletal negative musculoskeletal ROS (+)   Abdominal   Peds negative pediatric ROS (+)  Hematology  (+) anemia ,   Anesthesia Other Findings   Reproductive/Obstetrics negative OB ROS                             Anesthesia Physical Anesthesia Plan  ASA: III  Anesthesia Plan: MAC   Post-op Pain Management:    Induction: Intravenous  PONV Risk Score and Plan:   Airway Management Planned: Simple Face Mask  Additional Equipment:   Intra-op Plan:   Post-operative Plan:   Informed Consent: I have reviewed the patients History and Physical, chart, labs and discussed the procedure including the risks, benefits and alternatives for the proposed anesthesia with the patient or authorized representative who has indicated his/her understanding and acceptance.     Dental advisory given  Plan Discussed with: CRNA and Surgeon  Anesthesia Plan Comments:         Anesthesia Quick Evaluation

## 2018-11-20 NOTE — Op Note (Signed)
[facility] Patient Name: Vincent Park Procedure Date: 11/20/2018 MRN: [id number] Attending MD: Vida Rigger , MD Date of Birth: 12/31/1955 CSN: [id number] Age: 63 Admit Type: Outpatient Procedure:                Upper GI endoscopy Indications:              Abnormal CT of the GI tract Providers:                Vida Rigger, MD, Zoe Lan, RN, Arlee Muslim                            Tech., Technician, Randon Goldsmith, CRNA Referring MD:              Medicines:                Fentanyl 100 micrograms IV, Propofol total dose 130                            mg, IV, lidocaine 100 mg IV Complications:            No immediate complications. Estimated Blood Loss:     Estimated blood loss: none. Procedure:                Pre-Anesthesia Assessment:                           - Prior to the procedure, a History and Physical                            was performed, and patient medications and                            allergies were reviewed. The patient's tolerance of                            previous anesthesia was also reviewed. The risks                            and benefits of the procedure and the sedation                            options and risks were discussed with the patient.                            All questions were answered, and informed consent                            was obtained. Prior Anticoagulants: The patient has                            taken heparin, last dose was day of procedure. ASA                            Grade Assessment: III - A patient with severe  systemic disease. After reviewing the risks and                            benefits, the patient was deemed in satisfactory                            condition to undergo the procedure.                           After obtaining informed consent, the endoscope was                            passed under direct vision. Throughout the   procedure, the patient's blood pressure, pulse, and                            oxygen saturations were monitored continuously. The                            GIF-H190 ([id number]) Olympus gastroscope was                            introduced through the mouth, and advanced to the                            second part of duodenum. The upper GI endoscopy was                            accomplished without difficulty. The patient                            tolerated the procedure well. Scope In: Scope Out: Findings:      The larynx was normal.      A tiny hiatal hernia was present.      A small healed ulcer was found at the incisura. Adjacent mucosal       findings include nodularity with 1 raised area. Biopsies were taken with       a cold forceps for histology.      The duodenal bulb, first portion of the duodenum and second portion of       the duodenum were normal.      Prominent gastric folds were found on the greater curvature of the       stomach.      The exam was otherwise without abnormality. Impression:               - Normal larynx.                           - Tiny hiatal hernia.                           - Scar in the incisura. Biopsied.                           - Normal duodenal bulb, first portion of the  duodenum and second portion of the duodenum.                           - Enlarged gastric folds.                           - The examination was otherwise normal. Moderate Sedation:      Not Applicable - Patient had care per Anesthesia. Recommendation:           - Clear liquid diet today. Advance diet per                            surgical team                           - Continue present medications.                           - Await pathology results.                           - Return to GI clinic PRN.                           - Telephone GI clinic for pathology results in 1                            week.                           -  Telephone GI clinic if symptomatic PRN. Please                            call us if we can be of any further assistance with                            this hospital stay Procedure Code(s):        --- Professional ---                           [phone], Esophagogastroduodenoscopy, flexible,                            transoral; with biopsy, single or multiple Diagnosis Code(s):        --- Professional ---                           K44.9, Diaphragmatic hernia without obstruction or                            gangrene                           K31.89, Other diseases of stomach and duodenum                           K29.60, Other gastritis without bleeding  R93.3, Abnormal findings on diagnostic imaging of                            other parts of digestive tract CPT copyright 2018 [employer]. All rights reserved. The codes documented in this report are preliminary and upon coder review may  be revised to meet current compliance requirements. Vida Rigger, MD 11/20/2018 9:29:01 AM This report has been signed electronically. Number of Addenda: 0

## 2018-11-20 NOTE — Anesthesia Postprocedure Evaluation (Signed)
Anesthesia Post Note  Patient: Vincent Park  Procedure(s) Performed: ESOPHAGOGASTRODUODENOSCOPY (EGD) WITH PROPOFOL (N/A ) BIOPSY     Patient location during evaluation: PACU Anesthesia Type: MAC Level of consciousness: awake and alert Pain management: pain level controlled Vital Signs Assessment: post-procedure vital signs reviewed and stable Respiratory status: spontaneous breathing, nonlabored ventilation, respiratory function stable and patient connected to nasal cannula oxygen Cardiovascular status: stable and blood pressure returned to baseline Postop Assessment: no apparent nausea or vomiting Anesthetic complications: no    Last Vitals:  Vitals:   11/20/18 0750 11/20/18 0921  BP: (!) 112/55 (!) 94/59  Pulse: 88 89  Resp: 17 10  Temp: 36.9 C 36.9 C  SpO2: 97% 100%    Last Pain:  Vitals:   11/20/18 0921  TempSrc: Oral  PainSc: 0-No pain                 Lucifer Soja S

## 2018-11-21 ENCOUNTER — Inpatient Hospital Stay (HOSPITAL_COMMUNITY): Payer: Self-pay

## 2018-11-21 LAB — GLUCOSE, CAPILLARY: Glucose-Capillary: 99 mg/dL (ref 70–99)

## 2018-11-21 MED ORDER — SODIUM CHLORIDE (PF) 0.9 % IJ SOLN
INTRAMUSCULAR | Status: AC
  Administered 2018-11-21: 10 mL
  Filled 2018-11-21: qty 50

## 2018-11-21 MED ORDER — ENSURE ENLIVE PO LIQD
237.0000 mL | Freq: Three times a day (TID) | ORAL | Status: DC
  Administered 2018-11-21 – 2018-11-23 (×7): 237 mL via ORAL

## 2018-11-21 MED ORDER — IOHEXOL 300 MG/ML  SOLN
100.0000 mL | Freq: Once | INTRAMUSCULAR | Status: AC | PRN
  Administered 2018-11-21: 100 mL via INTRAVENOUS

## 2018-11-21 MED ORDER — TRAVASOL 10 % IV SOLN
INTRAVENOUS | Status: AC
  Administered 2018-11-21: 18:00:00 via INTRAVENOUS
  Filled 2018-11-21: qty 1056

## 2018-11-21 NOTE — Progress Notes (Signed)
PROGRESS NOTE    Vincent Park  HQU:[id number] DOB: Feb 15, 1956 DOA: 10/25/2018 PCP: Patient, No Pcp Per    Brief Narrative:   Vincent Park is a 63 year old  male history significant for perforated peptic ulcer in 2003 status post surgical intervention, GERD presenting with progressive epigastric abdominal pain associated with vomiting.  CT of the abdomen pelvis showed high-grade small bowel obstruction with significant ascites and free intraperitoneal air and peritoneal enhancement.  Patient admitted with possible perforated bowel and pneumoperitoneum.  Further work-up with abdominal MRI showed multiple intra-abdominal abscess with masslike nonspecific wall thickening involving the proximal stomach.  IR consulted and pain was placed at the site of abscess.  (4 initially and now down to 1 drain).  Patient started on TPN with follow-up abdominal CT on 3/6 showing improvement of the intra-abdominal abscesses.  Assessment & Plan:   Principal Problem:   SBO (small bowel obstruction) (HCC) Active Problems:   AKI (acute kidney injury) (HCC)   Cachexia (HCC)   PUD (peptic ulcer disease)   Moderate alcohol consumption   GERD (gastroesophageal reflux disease)   Pneumoperitoneum   Protein-calorie malnutrition, severe (HCC)   Intra-abdominal abscess (HCC)   Pneumoperitoneum with intra-abdominal abscess Patient presenting with progressive abdominal discomfort.  CT abdomen/pelvis on admission notable for high-grade small bowel obstruction with ascites and free intraperitoneal air and peritoneal enhancement.  Upper GI study on 2/19 with no definitive leak but persists with free air.  MRI of the abdomen on 11/02/2018 notable for gastric thickening.  Patient underwent IR drain placements x2 on 11/02/2018 with culture returning back rare ERYSIPELOTHRIX RHUSIOPATHIAE.  Repeat CT scan on 11/08/2018 notable for decreased free air but persistent abscesses with drains in place with 2 new fluid collections.   Patient underwent 2 additional drain placement and upsizing of previous drains on 11/09/2018 by IR.  Repeat CT abdomen/pelvis on 11/16/2018 notable for all 4 drain sites effectively drained except for a deep central pelvic abscess which has decreased.  IR discontinued 3 of the 4 drains.  Underwent EGD on 11/20/2018 with a small hiatal hernia, scar in the incisura that was biopsied; normal duodenal bulb, and enlarged gastric folds. --GI, IR and general surgery continue to follow, appreciate assistance --Continues with single abdominal drain in place, minimal output --Repeat CT abdomen/pelvis 11/21/2018 with persistent 4.7 x 4.9 cm thick walled fluid collection low pelvis between bladder/rectum not significantly changed --Continue antibiotics with Zosyn --Continue TPN; full liquid diet - management per general surgery --Protonix 40 mg IV twice daily  SBO (small bowel obstruction)  Full liquid diet.  Continues on TPN for further nutritional support. --Further diet advancement per surgery  Protein-calorie malnutrition, severe  Cachexia  Continue TPN.  Tolerating clears.   AKI (acute kidney injury)  Secondary to dehydration.  Resolved.  Alcohol abuse No signs of withdrawal.  History of peptic ulcer disease EGD showed 1 healed ulcer.  Transition to once a day PPI.  Anemia of chronic disease H&H stable.  Monitor.  Transaminitis Mild.  Monitor while on TPN.   DVT prophylaxis: Heparin Code Status: Full code Family Communication: None Disposition Plan: Continue inpatient level of care   Consultants:   Interventional radiology  General surgery, Dr. Marlyne Beards  Gastroenterology, Dr. Ewing Schlein  Procedures:   EGD 11/20/2018: No hernia, small healed ulcer at incisor, mucosal nodularity status post biopsy, duodenal bulb/first portion of duodenum/second portion duodenum normal, prominent gastric folds.  Antimicrobials:  Zosyn 2/13>>>  Cefazolin 2/27 - 2/28   Subjective: Patient  seen and examined  at bedside, no complaints this morning.  Awaiting CT scan later this afternoon to assess possible drain removal.  Tolerating full liquid diet, continues on TPN.  No other complaints this morning.  Denies headache, no fever/chills/night sweats, no nausea/vomiting/diarrhea, no chest pain, no palpitations, no shortness of breath, no abdominal discomfort, no cough/congestion, no paresthesias.  No acute events overnight per nursing staff.  Objective: Vitals:   11/20/18 1336 11/20/18 2156 11/21/18 0618 11/21/18 1358  BP: (!) 149/69 110/61 134/72 139/73  Pulse: (!) 113 91 94 96  Resp: 18 18 18 18   Temp: 98 F (36.7 C) 98.4 F (36.9 C) 98.2 F (36.8 C) 98.1 F (36.7 C)  TempSrc: Oral Oral Oral Oral  SpO2: 100% 100% 100% 100%  Weight:      Height:        Intake/Output Summary (Last 24 hours) at 11/21/2018 1450 Last data filed at 11/21/2018 1409 Gross per 24 hour  Intake 4061.55 ml  Output 3780 ml  Net 281.55 ml   Filed Weights   10/26/18 1258 11/02/18 1006 11/20/18 0451  Weight: 54.8 kg 57.1 kg 51.7 kg    Examination:  General exam: Appears calm and comfortable, thin and cachectic in appearance Respiratory system: Clear to auscultation. Respiratory effort normal. Cardiovascular system: S1 & S2 heard, RRR. No JVD, murmurs, rubs, gallops or clicks. No pedal edema. Gastrointestinal system: Abdomen is nondistended, soft and nontender. No organomegaly or masses felt. Normal bowel sounds heard.  Right upper quadrant drain noted Central nervous system: Alert and oriented. No focal neurological deficits. Extremities: Symmetric 5 x 5 power. Skin: No rashes, lesions or ulcers Psychiatry: Judgement and insight appear normal. Mood & affect appropriate.     Data Reviewed: I have personally reviewed following labs and imaging studies  CBC: Recent Labs  Lab 11/19/18 0357  WBC 6.0  NEUTROABS 3.8  HGB 9.6*  HCT 30.6*  MCV 104.8*  PLT 375   Basic Metabolic Panel: Recent  Labs  Lab 11/15/18 0342 11/17/18 0354 11/19/18 0357 11/20/18 0408  NA 136 136 135 134*  K 4.0 4.1 4.1 4.1  CL 103 104 102 102  CO2 27 27 27 25   GLUCOSE 97 106* 100* 101*  BUN 21 23 21 21   CREATININE 0.71 0.68 0.70 0.66  CALCIUM 8.8* 8.6* 8.9 8.8*  MG 1.9 1.8 1.9  --   PHOS 3.9 4.0 3.9  --    GFR: Estimated Creatinine Clearance: 68.2 mL/min (by C-G formula based on SCr of 0.66 mg/dL). Liver Function Tests: Recent Labs  Lab 11/15/18 0342 11/19/18 0357 11/20/18 0408  AST 31 49* 53*  ALT 32 53* 62*  ALKPHOS 252* 305* 304*  BILITOT 0.2* 0.4 0.2*  PROT 7.6 7.7 7.5  ALBUMIN 2.3* 2.5* 2.6*   No results for input(s): LIPASE, AMYLASE in the last 168 hours. No results for input(s): AMMONIA in the last 168 hours. Coagulation Profile: No results for input(s): INR, PROTIME in the last 168 hours. Cardiac Enzymes: No results for input(s): CKTOTAL, CKMB, CKMBINDEX, TROPONINI in the last 168 hours. BNP (last 3 results) No results for input(s): PROBNP in the last 8760 hours. HbA1C: No results for input(s): HGBA1C in the last 72 hours. CBG: Recent Labs  Lab 11/21/18 0613  GLUCAP 99   Lipid Profile: Recent Labs    11/19/18 0357  TRIG 119   Thyroid Function Tests: No results for input(s): TSH, T4TOTAL, FREET4, T3FREE, THYROIDAB in the last 72 hours. Anemia Panel: No results for input(s): VITAMINB12, FOLATE, FERRITIN, TIBC,  IRON, RETICCTPCT in the last 72 hours. Sepsis Labs: No results for input(s): PROCALCITON, LATICACIDVEN in the last 168 hours.  No results found for this or any previous visit (from the past 240 hour(s)).       Radiology Studies: Ct Abdomen Pelvis W Contrast  Result Date: 11/21/2018 CLINICAL DATA:  Abdominal infection, free air, bowel thickening, follow-up multiple prior exams EXAM: CT ABDOMEN AND PELVIS WITH CONTRAST TECHNIQUE: Multidetector CT imaging of the abdomen and pelvis was performed using the standard protocol following bolus administration  of intravenous contrast. CONTRAST:  100mL OMNIPAQUE IOHEXOL 300 MG/ML  SOLN COMPARISON:  11/16/2018, 11/08/2018, 10/25/2018 FINDINGS: Lower chest: Right basilar atelectasis or consolidation, unchanged prior examination. Hepatobiliary: No focal liver abnormality is seen. No gallstones, gallbladder wall thickening, or biliary dilatation. Pancreas: Unremarkable. No pancreatic ductal dilatation or surrounding inflammatory changes. Spleen: Normal in size without focal abnormality. Adrenals/Urinary Tract: Adrenal glands are unremarkable. Kidneys are normal, without renal calculi, focal lesion, or hydronephrosis. Bladder is unremarkable. Stomach/Bowel: Stomach is within normal limits. Appendix appears normal. No evidence of bowel wall thickening, distention, or inflammatory changes. Large burden of stool in the colon. Vascular/Lymphatic: No significant vascular findings are present. No enlarged abdominal or pelvic lymph nodes. Reproductive: No mass or other abnormality. Other: No abdominal wall hernia or abnormality. There is one remaining percutaneous pigtail drain catheter, positioned with formed pigtail in the right lower quadrant. There appears to be a persistent thick-walled fluid collection in the low pelvis interposed between the bladder and rectum, not significantly changed in size, measuring approximately 4.7 by 4.9 cm (series 2, image 73). No other significant volume of abdominal fluid. Musculoskeletal: No acute or significant osseous findings. IMPRESSION: There is one remaining percutaneous pigtail drain catheter, positioned with formed pigtail in the right lower quadrant. There appears to be a persistent thick-walled fluid collection in the low pelvis interposed between the bladder and rectum, not significantly changed in size, measuring approximately 4.7 by 4.9 cm (series 2, image 73). No other significant volume of abdominal fluid. Electronically Signed   By: Lauralyn PrimesAlex  Bibbey M.D.   On: 11/21/2018 14:30         Scheduled Meds:  feeding supplement  1 Container Oral TID BM   feeding supplement (ENSURE ENLIVE)  237 mL Oral TID BM   heparin injection (subcutaneous)  5,000 Units Subcutaneous Q8H   pantoprazole (PROTONIX) IV  40 mg Intravenous Q24H   sodium chloride flush  10-40 mL Intracatheter Q12H   sodium chloride flush  5 mL Intracatheter Q8H   sodium chloride flush  5 mL Intracatheter Q8H   Continuous Infusions:  sodium chloride 50 mL/hr at 11/21/18 0847   sodium chloride Stopped (11/16/18 0548)   piperacillin-tazobactam (ZOSYN)  IV 3.375 g (11/21/18 1325)   TPN ADULT (ION) 80 mL/hr at 11/21/18 0600   TPN ADULT (ION)       LOS: 27 days    Time spent: 28 minutes    Alvira PhilipsEric J [place], DO [facility] Pager [phone]  If 7PM-7AM, please contact night-coverage [email or web address] Password [email or web address] 11/21/2018, 2:50 PM

## 2018-11-21 NOTE — Progress Notes (Signed)
1 Day Post-Op    CC: Abdominal pain  Subjective: Patient looks fine tolerating full liquids well.  He continues to have this white milky drainage from his remaining drain.  She also had some saline leak from the IR catheter when they irrigated it this morning.  Objective: Vital signs in last 24 hours: Temp:  [98 F (36.7 C)-98.4 F (36.9 C)] 98.2 F (36.8 C) (03/11 0618) Pulse Rate:  [88-113] 94 (03/11 0618) Resp:  [10-18] 18 (03/11 0618) BP: (94-149)/(59-72) 134/72 (03/11 0618) SpO2:  [97 %-100 %] 100 % (03/11 0618) Last BM Date: 11/18/18 770 PO 2900 IV 4650 urine 40 from the drain Afebrile, VSS  Intake/Output from previous day: 03/10 0701 - 03/11 0700 In: 3786.6 [P.O.:770; I.V.:2843.2; IV Piggyback:173.4] Out: 4690 [Urine:4650; Drains:40] Intake/Output this shift: No intake/output data recorded.  General appearance: alert, cooperative and no distress Resp: clear to auscultation bilaterally GI: Soft nontender drain in place draining fluid as before.  Tolerating full liquids.  Lab Results:  Recent Labs    11/19/18 0357  WBC 6.0  HGB 9.6*  HCT 30.6*  PLT 375    BMET Recent Labs    11/19/18 0357 11/20/18 0408  NA 135 134*  K 4.1 4.1  CL 102 102  CO2 27 25  GLUCOSE 100* 101*  BUN 21 21  CREATININE 0.70 0.66  CALCIUM 8.9 8.8*   PT/INR No results for input(s): LABPROT, INR in the last 72 hours.  Recent Labs  Lab 11/15/18 0342 11/19/18 0357 11/20/18 0408  AST 31 49* 53*  ALT 32 53* 62*  ALKPHOS 252* 305* 304*  BILITOT 0.2* 0.4 0.2*  PROT 7.6 7.7 7.5  ALBUMIN 2.3* 2.5* 2.6*     Lipase     Component Value Date/Time   LIPASE 30 10/25/2018 1721     Medications: . feeding supplement  1 Container Oral TID BM  . feeding supplement (PRO-STAT SUGAR FREE 64)  30 mL Oral BID  . heparin injection (subcutaneous)  5,000 Units Subcutaneous Q8H  . pantoprazole (PROTONIX) IV  40 mg Intravenous Q24H  . sodium chloride flush  10-40 mL Intracatheter Q12H   . sodium chloride flush  5 mL Intracatheter Q8H  . sodium chloride flush  5 mL Intracatheter Q8H    Assessment/Plan Hx of medication non-compliance AKI- improving  Free air with ascites and thickened bowel loops -Benign exam and no signs of sepsis - this is likely subacute - this could be secondary to obstruction vs recurrent ulcer diseasevs perforated gastric cancer? - CA 19-9 elevated - patient is significantly malnourished with temporal wasting - needs TPN and nutritional support prior to any operative intervention - continue PPIBID, TPN and NPO - UGI study2/19showed no definitive leak, free air still seen - MR abdomen2/21shows gastric thickening - s/p IR drain placement x2(11/02/18);Culture:RARE ERYSIPELOTHRIX RHUSIOPATHIAE - CT scan 2/27 shows decreased free air, 2 persistent abscess with drains in place and 2 new fluid collections - s/p initial IR drain x2 upsized/replaced, 2 new IR drains placed 11/09/18 - GI holding off on EGD for now due to persistent free air,11/20/18 expected date for EGD -CT imaging3/6:All 4 drain sites effectively drained except for a deep central pelvic abscess which is decreased - down to 1 drain  EGD 3/10:Tiny hiatal hernia. Scar in the incisura. Biopsied.  Normal duodenal bulb, first portion of the duodenum and second portion of the duodenum.  Enlarged gastric folds. - The examination was otherwise normal. Pathology pending   Severe protein calorie malnutrition- continue  TPN and NG for now  - prealbumin 5.7>>10.2>>17.8>>24.8 (3/9)   HWT:UUEK liquids, TPN, IVF; protonixBID VTE: SCDs, sq heparin ID: zosyn 2/13>> day 25 Foley: none  Plan: Review with Dr. Carolynne Edouard and see if we can put him on a soft diet.  If he tolerated a soft diet we can begin weaning the TNA.      LOS: 27 days    Rin Gorton 11/21/2018 [phone]

## 2018-11-21 NOTE — Progress Notes (Signed)
    Referring Physician(s): Alanda Slim, T  Supervising Physician: Richarda Overlie  Patient Status:  [facility] - In-pt  Chief Complaint:  Abdominal abscesses  Subjective: Pt doing ok; denies sig abd pain,N/V   Allergies: Patient has no known allergies.  Medications: Prior to Admission medications   Medication Sig Start Date End Date Taking? Authorizing Provider  Alum & Mag Hydroxide-Simeth (GI COCKTAIL) SUSP suspension Take 30 mLs by mouth 2 (two) times daily. Shake well. 10/10/18  Yes Gwyneth Sprout, MD  omeprazole (PRILOSEC) 20 MG capsule Take 1 capsule (20 mg total) by mouth daily. 10/10/18  Yes Gwyneth Sprout, MD     Vital Signs: BP 134/72 (BP Location: Right Arm)   Pulse 94   Temp 98.2 F (36.8 C) (Oral)   Resp 18   Ht 6\' 2"  (1.88 m)   Wt 113 lb 15.7 oz (51.7 kg)   SpO2 100%   BMI 14.63 kg/m   Physical Exam awake/alert; RUQ drain intact, output 15 cc; insertion site ok; drain flushed with minimal return  Imaging: No results found.  Labs:  CBC: Recent Labs    11/05/18 0429 11/07/18 0520 11/12/18 0416 11/19/18 0357  WBC 5.1 6.8 10.1 6.0  HGB 9.1* 9.6* 9.6* 9.6*  HCT 28.2* 29.2* 30.0* 30.6*  PLT 280 308 438* 375    COAGS: Recent Labs    10/26/18 0500 11/08/18 1641  INR 1.13 1.0    BMP: Recent Labs    11/15/18 0342 11/17/18 0354 11/19/18 0357 11/20/18 0408  NA 136 136 135 134*  K 4.0 4.1 4.1 4.1  CL 103 104 102 102  CO2 27 27 27 25   GLUCOSE 97 106* 100* 101*  BUN 21 23 21 21   CALCIUM 8.8* 8.6* 8.9 8.8*  CREATININE 0.71 0.68 0.70 0.66  GFRNONAA >60 >60 >60 >60  GFRAA >60 >60 >60 >60    LIVER FUNCTION TESTS: Recent Labs    11/12/18 0416 11/15/18 0342 11/19/18 0357 11/20/18 0408  BILITOT 0.3 0.2* 0.4 0.2*  AST 28 31 49* 53*  ALT 23 32 53* 62*  ALKPHOS 215* 252* 305* 304*  PROT 7.1 7.6 7.7 7.5  ALBUMIN 2.0* 2.3* 2.5* 2.6*    Assessment and Plan: Pneumoperitoneum with intraabdominal abscesses s/p bilateral abdominal flank drain  placements x 4 (2 on 11/02/18 by Dr. Miles Costain, 2 on 11/09/18 by Dr. Archer Asa); now with only RUQ drain; afebrile; no new labs today; drain output decreasing; will plan f/u CT tomorrow   Electronically Signed: D. Jeananne Rama, PA-C 11/21/2018, 9:56 AM   I spent a total of 15 minutes at the the patient's bedside AND on the patient's hospital floor or unit, greater than 50% of which was counseling/coordinating care for abdominal abscess drain    Patient ID: [name], male   DOB: 08-10-1956, 63 y.o.   MRN: [id number]

## 2018-11-21 NOTE — Progress Notes (Signed)
Nutrition Follow-up  DOCUMENTATION CODES:   Underweight, Severe malnutrition in context of chronic illness  INTERVENTION:   D/C Prostat   Wean TPN per pharmacy once diet advanced to soft per CCM  Add Ensure Enlive po TID, each supplement provides 350 kcal and 20 grams of protein  Decrease Boost Breeze po BID, each supplement provides 250 kcal and 9 grams of protein  NUTRITION DIAGNOSIS:   Severe Malnutrition related to chronic illness(PUD with surgery) as evidenced by energy intake < or equal to 75% for > or equal to 1 month, severe fat depletion, severe muscle depletion, percent weight loss.  Ongoing  GOAL:   Patient will meet greater than or equal to 90% of their needs  Met with TPN  MONITOR:   PO intake, Supplement acceptance, Weight trends, Labs, Diet advancement, I & O's  REASON FOR ASSESSMENT:   Consult New TPN/TNA  ASSESSMENT:   Patient with PMH significant for GERD, and perforated peptic ulcer disease (possible surgery?). Presents this admission with free air ascites with thicken bowel loops from unclear etiology.    2/14- TPN start 2/21- CT showed large bilateral abd fluid collection, bilateral abd flank drain placement  2/28- IR drain exchange, 2 more placed   3/4- NGT removed  3/5- clear liquid diet 3/10- EGD shows no perf or obstruction, diet advanced to full liquid  Tolerating Boost TID and Prostat BID. Meal completions charted as 100% on full liquid. Pt eager to eat real food. Discussed using complete nutrition supplements like Ensure to maximize calories and protein while on liquid diet. Pt amendable. Pt tolerating TPN @ goal rate of 80 ml/hr (provides 1978 kcal and 106 g protein, meets 100% of needs). Per CCM, plan to wean once diet is advanced to soft.   Weight noted to decrease from 126 lb on 3/21 to 114 lb on 3/10. Will continue to monitor trends.   I/O: -676 ml since 2/26 UOP: 4650 ml x 24 hrs Drains: 40 ml x 24 hrs  Medications reviewed  and include: NS @ 50 ml/hr, IV abx Labs reviewed: Na 134 (L)   Diet Order:   Diet Order            Diet full liquid Room service appropriate? Yes; Fluid consistency: Thin  Diet effective now              EDUCATION NEEDS:   Not appropriate for education at this time  Skin:  Skin Assessment: Reviewed RN Assessment  Last BM:  3/9  Height:   Ht Readings from Last 1 Encounters:  10/25/18 6' 2"  (1.88 m)    Weight:   Wt Readings from Last 1 Encounters:  11/20/18 51.7 kg    Ideal Body Weight:  86.4 kg  BMI:  Body mass index is 14.63 kg/m.  Estimated Nutritional Needs:   Kcal:  1900-2100 grams  Protein:  95-115 grams  Fluid:  >/= 1.9 L/day   Mariana Single RD, LDN Clinical Nutrition Pager # - [phone]

## 2018-11-21 NOTE — Progress Notes (Signed)
PHARMACY - ADULT TOTAL PARENTERAL NUTRITION CONSULT NOTE   Pharmacy Consult for TPN Indication: bowel rest, severe malnutrition  Patient Measurements: Height: 6' 2" (188 cm) Weight: 113 lb 15.7 oz (51.7 kg) IBW/kg (Calculated) : 82.2 TPN AdjBW (KG): 54 Body mass index is 14.63 kg/m. Usual Weight: unknown as patient is poor historian, but reports significant weight loss over the past year  HPI: 47 yoM with PMH PUD with perforated gastric ulcer s/p repair in 2003, GERD presents with worsening chronic abdominal pain and associated weight loss. Cachectic appearing. CT reveals ascites with concern for SBO vs recurrent ulcer disease. Pt is NPO. Consulted by surgery for TPN management. Pt will require nutritional support prior to any surgical intervention.  Current Nutrition: NPO IVF: NS at 50 mL/hr   Central access: Double lumen PICC placed 2/14 TPN start date: 2/14  Significant events:  2/21: 2 abdominal flank drains placed 2/27: KUB w/ persistent free air, CT scan w/ persistent abd fluid collection plus 2 additional fluid collections RLQ anda deep central pelvis 2/28: to IR for drain exchanges/upsize x 2, 2 additional drains placed 3/3: per notes, pt not candidate for surgery right now, needs more bowel rest, needs EGD closer to when is surgical candidate, to reevaluate next week- still with free air.  3/4: NG tube removed 3/5: started CLD, supplements per dietary 3/6: 3 drains removed. Per CCS, patient tolerating CLD. Abdominal CT: "all 4 drain sites effectively drained except for a deep central pelvic abscess which is decreased" 3/10: Planning for EGD today   ASSESSMENT                                                                                                Today, 11/21/2018:   Glucose 101 (goal 100-150). BG check daily with AM labs only due to well controlled BG on TPN  Last labs 3/10  Electrolytes - stable WNL. (Na 134)  Renal - SCr, bicarb, BUN wnl, UOP 4 L, 25 mL  total per 1 drain  Hepatic - Alk Phos (3/9) continues to rise slowly - stable; LFTs slightly elevated; albumin remains low.  TGs - 119 (3/9) - WNL  Prealbumin - 24.8 (3/9) WNL  NUTRITIONAL GOALS                                                                                 RD recs (per note on 3/5): Kcal: 1900 - 2100 grams Protein: 95 - 115 g Fluid: >/= 1.9 L/day  TPN at goal rate of 80 mL/hr provides 106 g protein, 288 g dextrose, 1978 kcal; meeting 100% of patient needs.  PLAN  At 1800 today:  Continue TPN at goal rate of 80 mL/hr providing 100% of patient needs with 1978 kcal and 106 g protein  Electrolytes: no changes to formulation  No SSI needed  Continue IVF per MD. NaCl @ 50 mL/hr.  TPN lab panels on Mondays & Thursdays  [place] 11/21/2018, 7:48 AM Pager [phone]

## 2018-11-22 LAB — COMPREHENSIVE METABOLIC PANEL
ALT: 73 U/L — ABNORMAL HIGH (ref 0–44)
AST: 58 U/L — ABNORMAL HIGH (ref 15–41)
Albumin: 2.5 g/dL — ABNORMAL LOW (ref 3.5–5.0)
Alkaline Phosphatase: 299 U/L — ABNORMAL HIGH (ref 38–126)
Anion gap: 10 (ref 5–15)
BUN: 23 mg/dL (ref 8–23)
CO2: 25 mmol/L (ref 22–32)
CREATININE: 0.61 mg/dL (ref 0.61–1.24)
Calcium: 8.8 mg/dL — ABNORMAL LOW (ref 8.9–10.3)
Chloride: 100 mmol/L (ref 98–111)
GFR calc Af Amer: >60 mL/min (ref >60)
GFR calc non Af Amer: >60 mL/min (ref >60)
Glucose, Bld: 98 mg/dL (ref 70–99)
Potassium: 4.2 mmol/L (ref 3.5–5.1)
SODIUM: 135 mmol/L (ref 135–145)
Total Bilirubin: 0.3 mg/dL (ref 0.3–1.2)
Total Protein: 7.2 g/dL (ref 6.5–8.1)

## 2018-11-22 LAB — CBC
HCT: 28.3 % — ABNORMAL LOW (ref 39.0–52.0)
Hemoglobin: 9.2 g/dL — ABNORMAL LOW (ref 13.0–17.0)
MCH: 33.8 pg (ref 26.0–34.0)
MCHC: 32.5 g/dL (ref 30.0–36.0)
MCV: 104 fL — ABNORMAL HIGH (ref 80.0–100.0)
Platelets: 348 10*3/uL (ref 150–400)
RBC: 2.72 MIL/uL — ABNORMAL LOW (ref 4.22–5.81)
RDW: 16.3 % — ABNORMAL HIGH (ref 11.5–15.5)
WBC: 7.3 10*3/uL (ref 4.0–10.5)
nRBC: 0 % (ref 0.0–0.2)

## 2018-11-22 LAB — GLUCOSE, CAPILLARY: Glucose-Capillary: 106 mg/dL — ABNORMAL HIGH (ref 70–99)

## 2018-11-22 LAB — PHOSPHORUS: PHOSPHORUS: 3.9 mg/dL (ref 2.5–4.6)

## 2018-11-22 LAB — MAGNESIUM: Magnesium: 1.8 mg/dL (ref 1.7–2.4)

## 2018-11-22 MED ORDER — SACCHAROMYCES BOULARDII 250 MG PO CAPS
250.0000 mg | ORAL_CAPSULE | Freq: Two times a day (BID) | ORAL | Status: DC
  Administered 2018-11-22 – 2018-11-26 (×8): 250 mg via ORAL
  Filled 2018-11-22 (×8): qty 1

## 2018-11-22 MED ORDER — PANTOPRAZOLE SODIUM 40 MG PO TBEC
40.0000 mg | DELAYED_RELEASE_TABLET | Freq: Every day | ORAL | Status: DC
  Administered 2018-11-23 – 2018-11-26 (×4): 40 mg via ORAL
  Filled 2018-11-22 (×4): qty 1

## 2018-11-22 MED ORDER — TRAVASOL 10 % IV SOLN
INTRAVENOUS | Status: DC
  Administered 2018-11-22: 18:00:00 via INTRAVENOUS
  Filled 2018-11-22: qty 528

## 2018-11-22 NOTE — Progress Notes (Signed)
PROGRESS NOTE    Vincent Park  UQJ:[id number] DOB: 05-22-1956 DOA: 10/25/2018 PCP: Patient, No Pcp Per    Brief Narrative:   Vincent Park is a 63 year old  male history significant for perforated peptic ulcer in 2003 status post surgical intervention, GERD presenting with progressive epigastric abdominal pain associated with vomiting.  CT of the abdomen pelvis showed high-grade small bowel obstruction with significant ascites and free intraperitoneal air and peritoneal enhancement.  Patient admitted with possible perforated bowel and pneumoperitoneum.  Further work-up with abdominal MRI showed multiple intra-abdominal abscess with masslike nonspecific wall thickening involving the proximal stomach.  IR consulted and pain was placed at the site of abscess.  (4 initially and now down to 1 drain).  Patient started on TPN with follow-up abdominal CT on 3/6 showing improvement of the intra-abdominal abscesses.  Assessment & Plan:   Principal Problem:   SBO (small bowel obstruction) (HCC) Active Problems:   AKI (acute kidney injury) (HCC)   Cachexia (HCC)   PUD (peptic ulcer disease)   Moderate alcohol consumption   GERD (gastroesophageal reflux disease)   Pneumoperitoneum   Protein-calorie malnutrition, severe (HCC)   Intra-abdominal abscess (HCC)   Pneumoperitoneum with intra-abdominal abscess Patient presenting with progressive abdominal discomfort.  CT abdomen/pelvis on admission notable for high-grade small bowel obstruction with ascites and free intraperitoneal air and peritoneal enhancement.  Upper GI study on 2/19 with no definitive leak but persists with free air.  MRI of the abdomen on 11/02/2018 notable for gastric thickening.  Patient underwent IR drain placements x2 on 11/02/2018 with culture returning back rare ERYSIPELOTHRIX RHUSIOPATHIAE.  Repeat CT scan on 11/08/2018 notable for decreased free air but persistent abscesses with drains in place with 2 new fluid collections.   Patient underwent 2 additional drain placement and upsizing of previous drains on 11/09/2018 by IR.  Repeat CT abdomen/pelvis on 11/16/2018 notable for all 4 drain sites effectively drained except for a deep central pelvic abscess which has decreased.  IR discontinued 3 of the 4 drains.  Underwent EGD on 11/20/2018 with a small hiatal hernia, scar in the incisura that was biopsied; normal duodenal bulb, and enlarged gastric folds. --GI, IR and general surgery continue to follow, appreciate assistance --Repeat CT abdomen/pelvis 11/21/2018 with persistent 4.7 x 4.9 cm thick walled fluid collection low pelvis between bladder/rectum not significantly changed --IR removed remaining drain today --Continue antibiotics with Zosyn --Starting to titrate off TPN --Diet advanced to soft by general surgery today  --Protonix 40 mg PO daily  SBO (small bowel obstruction)  Soft diet.  Continues on TPN for further nutritional support; starting to titrate off --Further diet advancement per surgery  Protein-calorie malnutrition, severe  Cachexia  Continue TPN.  Tolerating soft diet currently  AKI (acute kidney injury)  Secondary to dehydration.  Resolved.  Alcohol abuse No signs of withdrawal.  History of peptic ulcer disease EGD showed 1 healed ulcer.  Transition to once a day PPI oral.  Anemia of chronic disease H&H stable.  Monitor.  Transaminitis Mild.  Monitor while on TPN.   DVT prophylaxis: Heparin Code Status: Full code Family Communication: None Disposition Plan: Continue inpatient level of care   Consultants:   Interventional radiology  General surgery, Dr. Marlyne Beards  Gastroenterology, Dr. Ewing Schlein  Procedures:   EGD 11/20/2018: No hernia, small healed ulcer at incisor, mucosal nodularity status post biopsy, duodenal bulb/first portion of duodenum/second portion duodenum normal, prominent gastric folds.  IR intra-abdominal drain placement x4  Antimicrobials:  Zosyn  2/13>>>  Cefazolin  2/27 - 2/28   Subjective: Patient seen and examined at bedside, no complaints this morning.  Interventional radiology removing final intra-abdominal drain today secondary to low output.  Diet advanced to soft, planning on titrating off of TPN. No other complaints this morning.  Denies headache, no fever/chills/night sweats, no nausea/vomiting/diarrhea, no chest pain, no palpitations, no shortness of breath, no abdominal discomfort, no cough/congestion, no paresthesias.  No acute events overnight per nursing staff.  Objective: Vitals:   11/21/18 2226 11/22/18 0459 11/22/18 1346 11/22/18 1458  BP: 111/63 127/68 (!) 128/112 (!) 113/54  Pulse: 98 90 (!) 105 98  Resp: 18 18 18    Temp: 98.2 F (36.8 C) 98.3 F (36.8 C) 98.4 F (36.9 C)   TempSrc: Oral Oral Oral   SpO2: 100% 100% 100%   Weight:      Height:        Intake/Output Summary (Last 24 hours) at 11/22/2018 1612 Last data filed at 11/22/2018 1400 Gross per 24 hour  Intake 3800.35 ml  Output 2625 ml  Net 1175.35 ml   Filed Weights   10/26/18 1258 11/02/18 1006 11/20/18 0451  Weight: 54.8 kg 57.1 kg 51.7 kg    Examination:  General exam: Appears calm and comfortable, thin and cachectic in appearance Respiratory system: Clear to auscultation. Respiratory effort normal. Cardiovascular system: S1 & S2 heard, RRR. No JVD, murmurs, rubs, gallops or clicks. No pedal edema. Gastrointestinal system: Abdomen is nondistended, soft and nontender. No organomegaly or masses felt. Normal bowel sounds heard.   Central nervous system: Alert and oriented. No focal neurological deficits. Extremities: Symmetric 5 x 5 power. Skin: No rashes, lesions or ulcers Psychiatry: Judgement and insight appear normal. Mood & affect appropriate.     Data Reviewed: I have personally reviewed following labs and imaging studies  CBC: Recent Labs  Lab 11/19/18 0357 11/22/18 0425  WBC 6.0 7.3  NEUTROABS 3.8  --   HGB 9.6* 9.2*   HCT 30.6* 28.3*  MCV 104.8* 104.0*  PLT 375 348   Basic Metabolic Panel: Recent Labs  Lab 11/17/18 0354 11/19/18 0357 11/20/18 0408 11/22/18 0425  NA 136 135 134* 135  K 4.1 4.1 4.1 4.2  CL 104 102 102 100  CO2 27 27 25 25   GLUCOSE 106* 100* 101* 98  BUN 23 21 21 23   CREATININE 0.68 0.70 0.66 0.61  CALCIUM 8.6* 8.9 8.8* 8.8*  MG 1.8 1.9  --  1.8  PHOS 4.0 3.9  --  3.9   GFR: Estimated Creatinine Clearance: 68.2 mL/min (by C-G formula based on SCr of 0.61 mg/dL). Liver Function Tests: Recent Labs  Lab 11/19/18 0357 11/20/18 0408 11/22/18 0425  AST 49* 53* 58*  ALT 53* 62* 73*  ALKPHOS 305* 304* 299*  BILITOT 0.4 0.2* 0.3  PROT 7.7 7.5 7.2  ALBUMIN 2.5* 2.6* 2.5*   No results for input(s): LIPASE, AMYLASE in the last 168 hours. No results for input(s): AMMONIA in the last 168 hours. Coagulation Profile: No results for input(s): INR, PROTIME in the last 168 hours. Cardiac Enzymes: No results for input(s): CKTOTAL, CKMB, CKMBINDEX, TROPONINI in the last 168 hours. BNP (last 3 results) No results for input(s): PROBNP in the last 8760 hours. HbA1C: No results for input(s): HGBA1C in the last 72 hours. CBG: Recent Labs  Lab 11/21/18 0613 11/22/18 0451  GLUCAP 99 106*   Lipid Profile: No results for input(s): CHOL, HDL, LDLCALC, TRIG, CHOLHDL, LDLDIRECT in the last 72 hours. Thyroid Function Tests: No results  for input(s): TSH, T4TOTAL, FREET4, T3FREE, THYROIDAB in the last 72 hours. Anemia Panel: No results for input(s): VITAMINB12, FOLATE, FERRITIN, TIBC, IRON, RETICCTPCT in the last 72 hours. Sepsis Labs: No results for input(s): PROCALCITON, LATICACIDVEN in the last 168 hours.  No results found for this or any previous visit (from the past 240 hour(s)).       Radiology Studies: Ct Abdomen Pelvis W Contrast  Result Date: 11/21/2018 CLINICAL DATA:  Abdominal infection, free air, bowel thickening, follow-up multiple prior exams EXAM: CT ABDOMEN AND  PELVIS WITH CONTRAST TECHNIQUE: Multidetector CT imaging of the abdomen and pelvis was performed using the standard protocol following bolus administration of intravenous contrast. CONTRAST:  OMNIPAQUE IOHEXOL 300 MG/ML  SOLN COMPARISON:  11/16/2018, 11/08/2018, 10/25/2018 FINDINGS: Lower chest: Right basilar atelectasis or consolidation, unchanged prior examination. Hepatobiliary: No focal liver abnormality is seen. No gallstones, gallbladder wall thickening, or biliary dilatation. Pancreas: Unremarkable. No pancreatic ductal dilatation or surrounding inflammatory changes. Spleen: Normal in size without focal abnormality. Adrenals/Urinary Tract: Adrenal glands are unremarkable. Kidneys are normal, without renal calculi, focal lesion, or hydronephrosis. Bladder is unremarkable. Stomach/Bowel: Stomach is within normal limits. Appendix appears normal. No evidence of bowel wall thickening, distention, or inflammatory changes. Large burden of stool in the colon. Vascular/Lymphatic: No significant vascular findings are present. No enlarged abdominal or pelvic lymph nodes. Reproductive: No mass or other abnormality. Other: No abdominal wall hernia or abnormality. There is one remaining percutaneous pigtail drain catheter, positioned with formed pigtail in the right lower quadrant. There appears to be a persistent thick-walled fluid collection in the low pelvis interposed between the bladder and rectum, not significantly changed in size, measuring approximately 4.7 by 4.9 cm (series 2, image 73). No other significant volume of abdominal fluid. Musculoskeletal: No acute or significant osseous findings. IMPRESSION: There is one remaining percutaneous pigtail drain catheter, positioned with formed pigtail in the right lower quadrant. There appears to be a persistent thick-walled fluid collection in the low pelvis interposed between the bladder and rectum, not significantly changed in size, measuring approximately 4.7 by  4.9 cm (series 2, image 73). No other significant volume of abdominal fluid. Electronically Signed   By: Lauralyn Primes M.D.   On: 11/21/2018 14:30        Scheduled Meds:  feeding supplement  1 Container Oral TID BM   feeding supplement (ENSURE ENLIVE)  237 mL Oral TID BM   heparin injection (subcutaneous)  5,000 Units Subcutaneous Q8H   [START ON 11/23/2018] pantoprazole  40 mg Oral Daily   saccharomyces boulardii  250 mg Oral BID   sodium chloride flush  10-40 mL Intracatheter Q12H   sodium chloride flush  5 mL Intracatheter Q8H   sodium chloride flush  5 mL Intracatheter Q8H   Continuous Infusions:  sodium chloride 50 mL/hr at 11/21/18 1802   sodium chloride Stopped (11/21/18 1807)   piperacillin-tazobactam (ZOSYN)  IV 3.375 g (11/22/18 1352)   TPN ADULT (ION) 80 mL/hr at 11/21/18 1740   TPN ADULT (ION)       LOS: 28 days    Time spent: 28 minutes    Alvira Philips [place], DO [facility] Pager [phone]  If 7PM-7AM, please contact night-coverage [email or web address] Password [facility] 11/22/2018, 4:12 PM

## 2018-11-22 NOTE — Progress Notes (Signed)
2 Days Post-Op    CC: Abdominal pain  Subjective: Patient has been advanced to a soft diet is tolerating well.  IR plans to repeat the CT last p.m.  Objective: Vital signs in last 24 hours: Temp:  [98.1 F (36.7 C)-98.3 F (36.8 C)] 98.3 F (36.8 C) (03/12 0459) Pulse Rate:  [90-98] 90 (03/12 0459) Resp:  [18] 18 (03/12 0459) BP: (111-139)/(63-73) 127/68 (03/12 0459) SpO2:  [100 %] 100 % (03/12 0459) Last BM Date: 11/21/18 1660 p.o. 3161 IV 2125 urine Drains 40 BM x2 Afebrile vital signs are stable Labs are stable, WBC 7.3 Repeat CT done last evening.  Noted 1 pigtail drain right lower quadrant with persistent thick-walled fluid collection in the low pelvis interposed between the bladder and the rectum not significantly changed in size no significant volume loss of abdominal fluid.  The remaining collection is 4.7 x 4.9 cm. Intake/Output from previous day: 03/11 0701 - 03/12 0700 In: 4926 [P.O.:1660; I.V.:3161.5; IV Piggyback:89.5] Out: 2165 [Urine:2125; Drains:40] Intake/Output this shift: Total I/O In: 240 [P.O.:240] Out: 500 [Urine:500]  General appearance: alert, cooperative and no distress GI: Soft nontender, tolerated diet.  Drainage from the JP is a light pinkish-white cloudy fluid today.  Lab Results:  Recent Labs    11/22/18 0425  WBC 7.3  HGB 9.2*  HCT 28.3*  PLT 348    BMET Recent Labs    11/20/18 0408 11/22/18 0425  NA 134* 135  K 4.1 4.2  CL 102 100  CO2 25 25  GLUCOSE 101* 98  BUN 21 23  CREATININE 0.66 0.61  CALCIUM 8.8* 8.8*   PT/INR No results for input(s): LABPROT, INR in the last 72 hours.  Recent Labs  Lab 11/19/18 0357 11/20/18 0408 11/22/18 0425  AST 49* 53* 58*  ALT 53* 62* 73*  ALKPHOS 305* 304* 299*  BILITOT 0.4 0.2* 0.3  PROT 7.7 7.5 7.2  ALBUMIN 2.5* 2.6* 2.5*     Lipase     Component Value Date/Time   LIPASE 30 10/25/2018 1721     Medications: . feeding supplement  1 Container Oral TID BM  . feeding  supplement (ENSURE ENLIVE)  237 mL Oral TID BM  . heparin injection (subcutaneous)  5,000 Units Subcutaneous Q8H  . pantoprazole (PROTONIX) IV  40 mg Intravenous Q24H  . sodium chloride flush  10-40 mL Intracatheter Q12H  . sodium chloride flush  5 mL Intracatheter Q8H  . sodium chloride flush  5 mL Intracatheter Q8H   . sodium chloride 50 mL/hr at 11/21/18 1802  . sodium chloride Stopped (11/21/18 1807)  . piperacillin-tazobactam (ZOSYN)  IV 3.375 g (11/22/18 0555)  . TPN ADULT (ION) 80 mL/hr at 11/21/18 1740    Assessment/Plan Hx of medication non-compliance AKI- improving  Free air with ascites and thickened bowel loops -Benign exam and no signs of sepsis - this is likely subacute - this could be secondary to obstruction vs recurrent ulcer diseasevs perforated gastric cancer? - CA 19-9 elevated - patient is significantly malnourished with temporal wasting - needs TPN and nutritional support prior to any operative intervention - continue PPIBID, TPN and NPO - UGI study2/19showed no definitive leak, free air still seen - MR abdomen2/21shows gastric thickening - s/p IR drain placement x2(11/02/18);Culture:RARE ERYSIPELOTHRIX RHUSIOPATHIAE - CT scan 2/27 shows decreased free air, 2 persistent abscess with drains in place and 2 new fluid collections - s/p initial IR drain x2 upsized/replaced, 2 new IR drains placed 11/09/18 - GI holding off on EGD  for now due to persistent free air,11/20/18 expected date for EGD -CT imaging3/6:All 4 drain sites effectively drained except for a deep central pelvic abscess which is decreased - down to 1 drain EGD 3/10:Tiny hiatal hernia. Scar in the incisura. Biopsied. Normal duodenal bulb, first portion of the duodenum and second portion of the duodenum. Enlarged gastric folds. - The examination was otherwise normal. Pathology pending   Severe protein calorie malnutrition- continue TPN and NG for now  - prealbumin  5.7>>10.2>>17.8>>24.8 (3/9)   NVB:TYOMAYO to soft diet last evening, TPN, IVF; protonixBID VTE: SCDs, sq heparin ID: zosyn 2/13>> day 26 Foley: none Follow up:  Abigail Miyamoto to follow Drain with IR   Plan: At this point with his diet up to a soft;  I would are weaning his TNA.  If he tolerates soft diet without any new issues of worsening drainage or abdominal pain we can discuss duration of antibiotic therapy.  We can also discuss disposition home with follow-up with the drain as an outpatient.  Patient lives independently, but has some family nearby.    LOS: 28 days    Odette Watanabe 11/22/2018 [phone]

## 2018-11-22 NOTE — Progress Notes (Signed)
PHARMACY - ADULT TOTAL PARENTERAL NUTRITION CONSULT NOTE   Pharmacy Consult for TPN Indication: bowel rest, severe malnutrition  Patient Measurements: Height: _0  (188 cm) Weight: 113 lb 15.7 oz (51.7 kg) IBW/kg (Calculated) : 82.2 TPN AdjBW (KG): 54 Body mass index is 14.63 kg/m. Usual Weight: unknown as patient is poor historian, but reports significant weight loss over the past year  HPI: 38 yoM with PMH PUD with perforated gastric ulcer s/p repair in 2003, GERD presents with worsening chronic abdominal pain and associated weight loss. Cachectic appearing. CT reveals ascites with concern for SBO vs recurrent ulcer disease. Pt is NPO. Consulted by surgery for TPN management. Pt will require nutritional support prior to any surgical intervention.  Current Nutrition: NPO IVF: NS at 50 mL/hr   Central access: Double lumen PICC placed 2/14 TPN start date: 2/14  Significant events:  2/21: 2 abdominal flank drains placed 2/27: KUB w/ persistent free air, CT scan w/ persistent abd fluid collection plus 2 additional fluid collections RLQ anda deep central pelvis 2/28: to IR for drain exchanges/upsize x 2, 2 additional drains placed 3/3: per notes, pt not candidate for surgery right now, needs more bowel rest, needs EGD closer to when is surgical candidate, to reevaluate next week- still with free air.  3/4: NG tube removed 3/5: started CLD, supplements per dietary 3/6: 3 drains removed. Per CCS, patient tolerating CLD. Abdominal CT: "all 4 drain sites effectively drained except for a deep central pelvic abscess which is decreased" 3/10: Planning for EGD today 3/12 tolerating soft diet, start weaning TPN   ASSESSMENT                                                                                                Today, 11/22/2018:   Glucose 106 (goal 100-150). BG check daily with AM labs only due to well controlled BG on TPN  Electrolytes - stable WNL.   Renal - SCr, bicarb, BUN  wnl, UOP 2.1 L, 40 mL total per 1 drain  Hepatic - Alk Phos elevated - stable; LFTs slightly elevated; albumin remains low.  TGs - 119 (3/9) - WNL  Prealbumin - 24.8 (3/9) WNL  NUTRITIONAL GOALS                                                                                 RD recs (per note on 3/5): Kcal: 1900 - 2100 grams Protein: 95 - 115 g Fluid: >/= 1.9 L/day  TPN at goal rate of 80 mL/hr provides 106 g protein, 288 g dextrose, 1978 kcal; meeting 100% of patient needs.  PLAN  At 1800 today:  decrease TPN to 40 mL/hr per MD, pt tolerating soft diet  Electrolytes: no changes to formulation  No SSI needed  Continue IVF per MD. NaCl @ 50 mL/hr.  TPN lab panels on Mondays & Thursdays  [place] 11/22/2018, 11:40 AM Pager [phone]

## 2018-11-22 NOTE — Progress Notes (Signed)
Referring Physician(s): Rayburn, Alphonsus Sias  Supervising Physician: Malachy Moan  Patient Status:  [facility] - In-pt  Chief Complaint: None  Subjective:  Multiple intraabdominal abscesses s/p bilateral abdominal/flank abscess drain placements x4 (x2 on 11/02/2018 by Dr. Miles Costain, x2 on 11/09/2018 by Dr. Archer Asa); 3 drains removed 11/16/2018, now with just RLQ drain in place. Patient awake and alert sitting in bed with no complaints at this time. RLQ drain site c/d/i with minimal output in JP drain.   Allergies: Patient has no known allergies.  Medications: Prior to Admission medications   Medication Sig Start Date End Date Taking? Authorizing Provider  Alum & Mag Hydroxide-Simeth (GI COCKTAIL) SUSP suspension Take 30 mLs by mouth 2 (two) times daily. Shake well. 10/10/18  Yes Gwyneth Sprout, MD  omeprazole (PRILOSEC) 20 MG capsule Take 1 capsule (20 mg total) by mouth daily. 10/10/18  Yes Gwyneth Sprout, MD     Vital Signs: BP 127/68 (BP Location: Left Arm)    Pulse 90    Temp 98.3 F (36.8 C) (Oral)    Resp 18    Ht 6\' 2"  (1.88 m)    Wt 113 lb 15.7 oz (51.7 kg)    SpO2 100%    BMI 14.63 kg/m   Physical Exam Vitals signs and nursing note reviewed.  Constitutional:      General: He is not in acute distress.    Appearance: Normal appearance.  Pulmonary:     Effort: Pulmonary effort is normal. No respiratory distress.  Abdominal:     Comments: RLQ drain site without tenderness, drainage, bleeding, ore erythema; minimal output of pale fluid in JP drain; drain removed without difficulty and pressure dressing applied to site.  Skin:    General: Skin is warm and dry.  Neurological:     Mental Status: He is alert and oriented to person, place, and time.  Psychiatric:        Mood and Affect: Mood normal.        Behavior: Behavior normal.        Thought Content: Thought content normal.        Judgment: Judgment normal.     Imaging: Ct Abdomen Pelvis W Contrast  Result  Date: 11/21/2018 CLINICAL DATA:  Abdominal infection, free air, bowel thickening, follow-up multiple prior exams EXAM: CT ABDOMEN AND PELVIS WITH CONTRAST TECHNIQUE: Multidetector CT imaging of the abdomen and pelvis was performed using the standard protocol following bolus administration of intravenous contrast. CONTRAST:  OMNIPAQUE IOHEXOL 300 MG/ML  SOLN COMPARISON:  11/16/2018, 11/08/2018, 10/25/2018 FINDINGS: Lower chest: Right basilar atelectasis or consolidation, unchanged prior examination. Hepatobiliary: No focal liver abnormality is seen. No gallstones, gallbladder wall thickening, or biliary dilatation. Pancreas: Unremarkable. No pancreatic ductal dilatation or surrounding inflammatory changes. Spleen: Normal in size without focal abnormality. Adrenals/Urinary Tract: Adrenal glands are unremarkable. Kidneys are normal, without renal calculi, focal lesion, or hydronephrosis. Bladder is unremarkable. Stomach/Bowel: Stomach is within normal limits. Appendix appears normal. No evidence of bowel wall thickening, distention, or inflammatory changes. Large burden of stool in the colon. Vascular/Lymphatic: No significant vascular findings are present. No enlarged abdominal or pelvic lymph nodes. Reproductive: No mass or other abnormality. Other: No abdominal wall hernia or abnormality. There is one remaining percutaneous pigtail drain catheter, positioned with formed pigtail in the right lower quadrant. There appears to be a persistent thick-walled fluid collection in the low pelvis interposed between the bladder and rectum, not significantly changed in size, measuring approximately 4.7 by 4.9 cm (series  2, image 73). No other significant volume of abdominal fluid. Musculoskeletal: No acute or significant osseous findings. IMPRESSION: There is one remaining percutaneous pigtail drain catheter, positioned with formed pigtail in the right lower quadrant. There appears to be a persistent thick-walled fluid  collection in the low pelvis interposed between the bladder and rectum, not significantly changed in size, measuring approximately 4.7 by 4.9 cm (series 2, image 73). No other significant volume of abdominal fluid. Electronically Signed   By: Lauralyn Primes M.D.   On: 11/21/2018 14:30    Labs:  CBC: Recent Labs    11/07/18 0520 11/12/18 0416 11/19/18 0357 11/22/18 0425  WBC 6.8 10.1 6.0 7.3  HGB 9.6* 9.6* 9.6* 9.2*  HCT 29.2* 30.0* 30.6* 28.3*  PLT 308 438* 375 348    COAGS: Recent Labs    10/26/18 0500 11/08/18 1641  INR 1.13 1.0    BMP: Recent Labs    11/17/18 0354 11/19/18 0357 11/20/18 0408 11/22/18 0425  NA 136 135 134* 135  K 4.1 4.1 4.1 4.2  CL 104 102 102 100  CO2 27 27 25 25   GLUCOSE 106* 100* 101* 98  BUN 23 21 21 23   CALCIUM 8.6* 8.9 8.8* 8.8*  CREATININE 0.68 0.70 0.66 0.61  GFRNONAA >60 >60 >60 >60  GFRAA >60 >60 >60 >60    LIVER FUNCTION TESTS: Recent Labs    11/15/18 0342 11/19/18 0357 11/20/18 0408 11/22/18 0425  BILITOT 0.2* 0.4 0.2* 0.3  AST 31 49* 53* 58*  ALT 32 53* 62* 73*  ALKPHOS 252* 305* 304* 299*  PROT 7.6 7.7 7.5 7.2  ALBUMIN 2.3* 2.5* 2.6* 2.5*    Assessment and Plan:  Multiple intraabdominal abscesses s/p bilateral abdominal/flank abscess drain placements x4 (x2 on 11/02/2018 by Dr. Miles Costain, x2 on 11/09/2018 by Dr. Archer Asa); 3 drains removed 11/16/2018, now with just RLQ drain in place. CT abdomen/pelvis from 11/21/2018 reviewed by Dr. Archer Asa who recommends drain removal if no significant output; RN reports minimal output within past 24 hours; RLQ drain removed without difficulty and pressure dressing applied to site. Please call IR with questions/concerns.   Electronically Signed: Elwin Mocha, PA-C 11/22/2018, 1:05 PM   I spent a total of 25 Minutes at the the patient's bedside AND on the patient's hospital floor or unit, greater than 50% of which was counseling/coordinating care for multiple intraabdominal  abscesses s/p drain placement.

## 2018-11-23 MED ORDER — ADULT MULTIVITAMIN W/MINERALS CH
1.0000 | ORAL_TABLET | Freq: Every day | ORAL | Status: DC
  Administered 2018-11-23 – 2018-11-26 (×4): 1 via ORAL
  Filled 2018-11-23 (×4): qty 1

## 2018-11-23 NOTE — Progress Notes (Addendum)
3 Days Post-Op    CC: Abdominal pain  Subjective: Patient is doing well on a soft diet.  IR removed his last drain yesterday.  He is not having any abdominal discomfort.  Drain site looks fine.  Objective: Vital signs in last 24 hours: Temp:  [98.1 F (36.7 C)-98.4 F (36.9 C)] 98.2 F (36.8 C) (03/13 0514) Pulse Rate:  [76-105] 76 (03/13 0514) Resp:  [18-20] 20 (03/13 0514) BP: (111-128)/(54-112) 111/62 (03/13 0514) SpO2:  [100 %] 100 % (03/13 0514) Weight:  [55.1 kg] 55.1 kg (03/13 0514) Last BM Date: 11/22/18 600 p.o. 2670 IV 3050 urine Drain removed Afebrile vital signs are stable WBC is 7.3, CMP is stable Intake/Output from previous day: 03/12 0701 - 03/13 0700 In: 3378.6 [P.O.:600; I.V.:2670.6; IV Piggyback:108] Out: 3050 [Urine:3050] Intake/Output this shift: No intake/output data recorded.  General appearance: alert, cooperative and no distress Resp: clear to auscultation bilaterally GI: Soft, positive bowel sounds, tolerating a regular soft diet, positive BM.  Lab Results:  Recent Labs    11/22/18 0425  WBC 7.3  HGB 9.2*  HCT 28.3*  PLT 348    BMET Recent Labs    11/22/18 0425  NA 135  K 4.2  CL 100  CO2 25  GLUCOSE 98  BUN 23  CREATININE 0.61  CALCIUM 8.8*   PT/INR No results for input(s): LABPROT, INR in the last 72 hours.  Recent Labs  Lab 11/19/18 0357 11/20/18 0408 11/22/18 0425  AST 49* 53* 58*  ALT 53* 62* 73*  ALKPHOS 305* 304* 299*  BILITOT 0.4 0.2* 0.3  PROT 7.7 7.5 7.2  ALBUMIN 2.5* 2.6* 2.5*     Lipase     Component Value Date/Time   LIPASE 30 10/25/2018 1721     Medications: . feeding supplement  1 Container Oral TID BM  . feeding supplement (ENSURE ENLIVE)  237 mL Oral TID BM  . heparin injection (subcutaneous)  5,000 Units Subcutaneous Q8H  . pantoprazole  40 mg Oral Daily  . saccharomyces boulardii  250 mg Oral BID  . sodium chloride flush  10-40 mL Intracatheter Q12H  . sodium chloride flush  5 mL  Intracatheter Q8H  . sodium chloride flush  5 mL Intracatheter Q8H    Assessment/Plan Hx of medication non-compliance AKI- improving  Free air with ascites and thickened bowel loops -Benign exam and no signs of sepsis - this is likely subacute - this could be secondary to obstruction vs recurrent ulcer diseasevs perforated gastric cancer? - CA 19-9 elevated - patient is significantly malnourished with temporal wasting - needs TPN and nutritional support prior to any operative intervention - continue PPIBID, TPN and NPO - UGI study2/19showed no definitive leak, free air still seen - MR abdomen2/21shows gastric thickening - s/p IR drain placement x2(11/02/18);Culture:RARE ERYSIPELOTHRIX RHUSIOPATHIAE - CT scan 2/27 shows decreased free air, 2 persistent abscess with drains in place and 2 new fluid collections - s/p initial IR drain x2 upsized/replaced, 2 new IR drains placed2/28/20 - GI holding off on EGD for now due to persistent free air,11/20/18 expected date for EGD -CT imaging3/6:All 4 drain sites effectively drained except for a deep central pelvic abscess which is decreased- down to 1 drain EGD 3/10:Tiny hiatal hernia. Scar in the incisura. Biopsied. Normal duodenal bulb, first portion of the duodenum and second portion of the duodenum. Enlarged gastric folds. - The examination was otherwise normal. Pathology pending   Severe protein calorie malnutrition- continue TPN and NG for now  - prealbumin  5.7>>10.2>>17.8>>24.8 (3/9)   XJD:BZMCEYE to soft diet last evening, TPN, IVF; protonixBID VTE: SCDs, sq heparin ID: zosyn 2/13>> day 26 Foley: none Follow up:   If his biopsy is positive for cancer he will need to follow-up with Dr. Donell Beers.  If it is not positive for cancer he just needs to follow-up with GI.  Plan: His drains are out, he is afebrile, and WBC is normal.  We will stop his antibiotics and his TNA, advance him to a regular diet.  I have  asked the dietitian assist with supplements as he can take them.  Ask OT and PT to see and start making plans for discharge.  I have ordered labs for Monday, current labs will go away with TPN. I also ordered a CBC in the morning see how he does off antibiotics.  Pathology report:  Stomach, biopsy, of nodular scarring - MILD CHRONIC GASTRITIS WITH ACUTE INFLAMMATION AND ASSOCIATED GRANULATION TISSUE - INTESTINAL METAPLASIA PRESENT - NO H. PYLORI IDENTIFIED - SEE COMMENT Microscopic Comment CD20 and CD3 immunohistochemistry show an admixture of B and T-cells. H. pylori immunohistochemistry is NEGATIVE for microorganisms.  Follow up with GI.    LOS: 29 days    Vincent Park 11/23/2018 [id number]

## 2018-11-23 NOTE — Progress Notes (Signed)
PHARMACY - ADULT TOTAL PARENTERAL NUTRITION CONSULT NOTE   Pharmacy Consult for TPN Indication: bowel rest, severe malnutrition  Patient Measurements: Height: 6' 2"  (188 cm) Weight: 121 lb 7.6 oz (55.1 kg) IBW/kg (Calculated) : 82.2 TPN AdjBW (KG): 54 Body mass index is 15.6 kg/m. Usual Weight: unknown as patient is poor historian, but reports significant weight loss over the past year  HPI: 66 yoM with PMH PUD with perforated gastric ulcer s/p repair in 2003, GERD presents with worsening chronic abdominal pain and associated weight loss. Cachectic appearing. CT reveals ascites with concern for SBO vs recurrent ulcer disease. Pt is NPO. Consulted by surgery for TPN management. Pt will require nutritional support prior to any surgical intervention.  Current Nutrition: NPO IVF: NS at 50 mL/hr   Central access: Double lumen PICC placed 2/14 TPN start date: 2/14  Significant events:  2/21: 2 abdominal flank drains placed 2/27: KUB w/ persistent free air, CT scan w/ persistent abd fluid collection plus 2 additional fluid collections RLQ anda deep central pelvis 2/28: to IR for drain exchanges/upsize x 2, 2 additional drains placed 3/3: per notes, pt not candidate for surgery right now, needs more bowel rest, needs EGD closer to when is surgical candidate, to reevaluate next week- still with free air.  3/4: NG tube removed 3/5: started CLD, supplements per dietary 3/6: 3 drains removed. Per CCS, patient tolerating CLD. Abdominal CT: "all 4 drain sites effectively drained except for a deep central pelvic abscess which is decreased" 3/10:  EGD today 3/12 tolerating soft diet, start weaning TPN. Last drain removed. 3/13 Discontinue TPN per surgery   ASSESSMENT                                                                                                Today, 11/23/2018:   Glucose (goal 100-150). Has been well controlled while on TPN.  Electrolytes - stable WNL. NNL.  Renal - SCr,  bicarb, BUN wnl, UOP 2.1 L, 40 mL total per 1 drain  Hepatic - Alk Phos elevated - stable; LFTs slightly elevated; albumin remains low.  TGs - 119 (3/9) - WNL  Prealbumin - 24.8 (3/9) WNL  NUTRITIONAL GOALS                                                                                 RD recs (per note on 3/5): Kcal: 1900 - 2100 grams Protein: 95 - 115 g Fluid: >/= 1.9 L/day  TPN was reduced to 40 mL/hr last night. Pt is tolerating diet with 100% of meal charted.  PLAN  Now:  Discontinue TPN  No SSI was ordered.   IVF per MD. NaCl @ 50 mL/hr.  Pharmacy signing off.   Lenis Noon, PharmD 11/23/18 10:55 AM

## 2018-11-23 NOTE — Progress Notes (Signed)
OT Cancellation Note  Patient Details Name: Vincent Park MRN: [id number] DOB: July 29, 1956   Cancelled Treatment:    Reason Eval/Treat Not Completed: PT screened, no needs identified, will sign off  Ely Ballen 11/23/2018, 3:30 PM  Marica Otter, OTR/L Acute Rehabilitation Services [phone] [facility] pager [phone] office 11/23/2018

## 2018-11-23 NOTE — Progress Notes (Signed)
PROGRESS NOTE    [name]  ZOX:[id number] DOB: 1955-11-28 DOA: 10/25/2018 PCP: Patient, No Pcp Per    Brief Narrative:   Vincent Park is a 63 year old  male history significant for perforated peptic ulcer in 2003 status post surgical intervention, GERD presenting with progressive epigastric abdominal pain associated with vomiting.  CT of the abdomen pelvis showed high-grade small bowel obstruction with significant ascites and free intraperitoneal air and peritoneal enhancement.  Patient admitted with possible perforated bowel and pneumoperitoneum.  Further work-up with abdominal MRI showed multiple intra-abdominal abscess with masslike nonspecific wall thickening involving the proximal stomach.  IR consulted and intra-abdominal drains were placed at the site of abscess.    Assessment & Plan:   Principal Problem:   SBO (small bowel obstruction) (HCC) Active Problems:   AKI (acute kidney injury) (HCC)   Cachexia (HCC)   PUD (peptic ulcer disease)   Moderate alcohol consumption   GERD (gastroesophageal reflux disease)   Pneumoperitoneum   Protein-calorie malnutrition, severe (HCC)   Intra-abdominal abscess (HCC)   Pneumoperitoneum with intra-abdominal abscess Patient presenting with progressive abdominal discomfort.  CT abdomen/pelvis on admission notable for high-grade small bowel obstruction with ascites and free intraperitoneal air and peritoneal enhancement.  Upper GI study on 2/19 with no definitive leak but persists with free air.  MRI of the abdomen on 11/02/2018 notable for gastric thickening.  Patient underwent IR drain placements x2 on 11/02/2018 with culture returning back rare ERYSIPELOTHRIX RHUSIOPATHIAE.  Repeat CT scan on 11/08/2018 notable for decreased free air but persistent abscesses with drains in place with 2 new fluid collections.  Patient underwent 2 additional drain placement and upsizing of previous drains on 11/09/2018 by IR.  Repeat CT abdomen/pelvis on  11/16/2018 notable for all 4 drain sites effectively drained except for a deep central pelvic abscess which has decreased.  IR discontinued 3 of the 4 drains.  Underwent EGD on 11/20/2018 with a small hiatal hernia, scar in the incisura that was biopsied; normal duodenal bulb, and enlarged gastric folds. --GI, IR and general surgery continue to follow, appreciate assistance --Repeat CT abdomen/pelvis 11/21/2018 with persistent 4.7 x 4.9 cm thick walled fluid collection low pelvis between bladder/rectum not significantly changed --IR removed final remaining intra-abdominal drain on 11/22/2018 --Zosyn discontinued today by general surgery --TPN discontinued today by surgery --Diet advanced to regular by general surgery today  --Protonix 40 mg PO daily --Continue to monitor now off antibiotics and drain removed  SBO (small bowel obstruction)  Diet advanced to regular today, discontinuing TPN by general surgery.   --Continue to monitor bowel movements closely  Protein-calorie malnutrition, severe  Cachexia  --TPN discontinued on 11/23/2018  AKI (acute kidney injury)  Secondary to dehydration.  Resolved.  Alcohol abuse No signs of withdrawal.  History of peptic ulcer disease EGD showed 1 healed ulcer.  Transition to once a day PPI oral.  Anemia of chronic disease H&H stable.  Monitor.  Transaminitis Mild.  Monitor while on TPN.   DVT prophylaxis: Heparin Code Status: Full code Family Communication: None Disposition Plan: Continue inpatient level of care   Consultants:   Interventional radiology  General surgery, Dr. Marlyne Beards  Gastroenterology, Dr. Ewing Schlein  Procedures:   EGD 11/20/2018: No hernia, small healed ulcer at incisor, mucosal nodularity status post biopsy, duodenal bulb/first portion of duodenum/second portion duodenum normal, prominent gastric folds.  IR intra-abdominal drain placement x4  PICC line placed 10/26/2018  Antimicrobials:  Zosyn 2/13 -  3/13  Cefazolin 2/27 - 2/28   Subjective:  Patient seen and examined at bedside, no complaints this morning.  Last intra-abdominal drain removed by IR yesterday.  General surgery advancing diet to regular and discontinue TPN.  Also discontinued antibiotics today.  We will continue to monitor closely.  Pending repeat PT evaluation for discharge needs.  If remains stable, hopeful for discharge early next week.  Denies headache, no fever/chills/night sweats, no nausea/vomiting/diarrhea, no chest pain, no palpitations, no shortness of breath, no abdominal discomfort, no cough/congestion, no paresthesias.  No acute events overnight per nursing staff.  Objective: Vitals:   11/22/18 1346 11/22/18 1458 11/22/18 2054 11/23/18 0514  BP: (!) 128/112 (!) 113/54 119/60 111/62  Pulse: (!) 105 98 90 76  Resp: 18  20 20   Temp: 98.4 F (36.9 C)  98.1 F (36.7 C) 98.2 F (36.8 C)  TempSrc: Oral  Oral Oral  SpO2: 100%  100% 100%  Weight:    55.1 kg  Height:        Intake/Output Summary (Last 24 hours) at 11/23/2018 1231 Last data filed at 11/23/2018 1000 Gross per 24 hour  Intake 2808.77 ml  Output 3325 ml  Net -516.23 ml   Filed Weights   11/02/18 1006 11/20/18 0451 11/23/18 0514  Weight: 57.1 kg 51.7 kg 55.1 kg    Examination:  General exam: Appears calm and comfortable, thin and cachectic in appearance Respiratory system: Clear to auscultation. Respiratory effort normal. Cardiovascular system: S1 & S2 heard, RRR. No JVD, murmurs, rubs, gallops or clicks. No pedal edema. Gastrointestinal system: Abdomen is nondistended, soft and nontender. No organomegaly or masses felt. Normal bowel sounds heard.   Central nervous system: Alert and oriented. No focal neurological deficits. Extremities: Symmetric 5 x 5 power. Skin: No rashes, lesions or ulcers Psychiatry: Judgement and insight appear normal. Mood & affect appropriate.     Data Reviewed: I have personally reviewed following labs and  imaging studies  CBC: Recent Labs  Lab 11/19/18 0357 11/22/18 0425  WBC 6.0 7.3  NEUTROABS 3.8  --   HGB 9.6* 9.2*  HCT 30.6* 28.3*  MCV 104.8* 104.0*  PLT 375 348   Basic Metabolic Panel: Recent Labs  Lab 11/17/18 0354 11/19/18 0357 11/20/18 0408 11/22/18 0425  NA 136 135 134* 135  K 4.1 4.1 4.1 4.2  CL 104 102 102 100  CO2 27 27 25 25   GLUCOSE 106* 100* 101* 98  BUN 23 21 21 23   CREATININE 0.68 0.70 0.66 0.61  CALCIUM 8.6* 8.9 8.8* 8.8*  MG 1.8 1.9  --  1.8  PHOS 4.0 3.9  --  3.9   GFR: Estimated Creatinine Clearance: 72.7 mL/min (by C-G formula based on SCr of 0.61 mg/dL). Liver Function Tests: Recent Labs  Lab 11/19/18 0357 11/20/18 0408 11/22/18 0425  AST 49* 53* 58*  ALT 53* 62* 73*  ALKPHOS 305* 304* 299*  BILITOT 0.4 0.2* 0.3  PROT 7.7 7.5 7.2  ALBUMIN 2.5* 2.6* 2.5*   No results for input(s): LIPASE, AMYLASE in the last 168 hours. No results for input(s): AMMONIA in the last 168 hours. Coagulation Profile: No results for input(s): INR, PROTIME in the last 168 hours. Cardiac Enzymes: No results for input(s): CKTOTAL, CKMB, CKMBINDEX, TROPONINI in the last 168 hours. BNP (last 3 results) No results for input(s): PROBNP in the last 8760 hours. HbA1C: No results for input(s): HGBA1C in the last 72 hours. CBG: Recent Labs  Lab 11/21/18 0613 11/22/18 0451  GLUCAP 99 106*   Lipid Profile: No results for input(s): CHOL,  HDL, LDLCALC, TRIG, CHOLHDL, LDLDIRECT in the last 72 hours. Thyroid Function Tests: No results for input(s): TSH, T4TOTAL, FREET4, T3FREE, THYROIDAB in the last 72 hours. Anemia Panel: No results for input(s): VITAMINB12, FOLATE, FERRITIN, TIBC, IRON, RETICCTPCT in the last 72 hours. Sepsis Labs: No results for input(s): PROCALCITON, LATICACIDVEN in the last 168 hours.  No results found for this or any previous visit (from the past 240 hour(s)).       Radiology Studies: Ct Abdomen Pelvis W Contrast  Result Date:  11/21/2018 CLINICAL DATA:  Abdominal infection, free air, bowel thickening, follow-up multiple prior exams EXAM: CT ABDOMEN AND PELVIS WITH CONTRAST TECHNIQUE: Multidetector CT imaging of the abdomen and pelvis was performed using the standard protocol following bolus administration of intravenous contrast. CONTRAST:  OMNIPAQUE IOHEXOL 300 MG/ML  SOLN COMPARISON:  11/16/2018, 11/08/2018, 10/25/2018 FINDINGS: Lower chest: Right basilar atelectasis or consolidation, unchanged prior examination. Hepatobiliary: No focal liver abnormality is seen. No gallstones, gallbladder wall thickening, or biliary dilatation. Pancreas: Unremarkable. No pancreatic ductal dilatation or surrounding inflammatory changes. Spleen: Normal in size without focal abnormality. Adrenals/Urinary Tract: Adrenal glands are unremarkable. Kidneys are normal, without renal calculi, focal lesion, or hydronephrosis. Bladder is unremarkable. Stomach/Bowel: Stomach is within normal limits. Appendix appears normal. No evidence of bowel wall thickening, distention, or inflammatory changes. Large burden of stool in the colon. Vascular/Lymphatic: No significant vascular findings are present. No enlarged abdominal or pelvic lymph nodes. Reproductive: No mass or other abnormality. Other: No abdominal wall hernia or abnormality. There is one remaining percutaneous pigtail drain catheter, positioned with formed pigtail in the right lower quadrant. There appears to be a persistent thick-walled fluid collection in the low pelvis interposed between the bladder and rectum, not significantly changed in size, measuring approximately 4.7 by 4.9 cm (series 2, image 73). No other significant volume of abdominal fluid. Musculoskeletal: No acute or significant osseous findings. IMPRESSION: There is one remaining percutaneous pigtail drain catheter, positioned with formed pigtail in the right lower quadrant. There appears to be a persistent thick-walled fluid  collection in the low pelvis interposed between the bladder and rectum, not significantly changed in size, measuring approximately 4.7 by 4.9 cm (series 2, image 73). No other significant volume of abdominal fluid. Electronically Signed   By: Lauralyn Primes M.D.   On: 11/21/2018 14:30        Scheduled Meds:  feeding supplement  1 Container Oral TID BM   feeding supplement (ENSURE ENLIVE)  237 mL Oral TID BM   heparin injection (subcutaneous)  5,000 Units Subcutaneous Q8H   pantoprazole  40 mg Oral Daily   saccharomyces boulardii  250 mg Oral BID   Continuous Infusions:  sodium chloride 50 mL/hr at 11/23/18 1016   sodium chloride Stopped (11/21/18 1807)     LOS: 29 days    Time spent: 26 minutes    Alvira Philips [place], DO [facility] Pager [phone]  If 7PM-7AM, please contact night-coverage [email or web address] Password [facility] 11/23/2018, 12:31 PM

## 2018-11-23 NOTE — Progress Notes (Signed)
Nutrition Follow-up  DOCUMENTATION CODES:   Underweight, Severe malnutrition in context of chronic illness  INTERVENTION:  - Continue Boost Breeze TID, each supplement provides 250 kcal and 9 grams of protein. - Continue Ensure Enlive BID, each supplement provides 350 kcal and 20 grams of protein. - Continue to encourage PO intakes. - Will continue to monitor for additional nutrition-related needs now that TPN stopped.   NUTRITION DIAGNOSIS:   Severe Malnutrition related to chronic illness(PUD with surgery) as evidenced by energy intake < or equal to 75% for > or equal to 1 month, severe fat depletion, severe muscle depletion, percent weight loss. -ongoing, improving with adequate nutrition  GOAL:   Patient will meet greater than or equal to 90% of their needs -met at this time  MONITOR:   PO intake, Supplement acceptance, Weight trends, Labs  REASON FOR ASSESSMENT:   Consult Assessment of nutrition requirement/status(TPN being stopped today)  ASSESSMENT:   Patient with PMH significant for GERD, and perforated peptic ulcer disease (possible surgery?). Presents this admission with free air ascites with thicken bowel loops from unclear etiology.   Significant Events: 2/14- TPN start 2/21- CT showed large bilateral abd fluid collection,           bilateral abd flank drain placement 2/28- IR drain exchange, 2 more placed  3/4- NGT removed  3/5- diet advanced from NPO to CLD 3/10- EGD shows no perf or obstruction, diet advanced to           FLD 3/11- diet advanced to Soft 3/12- TPN being stopped today, diet advanced to Regular,            all drains removed by this date   Consult received. Patient consumed 75% of breakfast and 50% of lunch yesterday on Soft diet. After diet was advanced to Regular this AM, he consumed 100% of breakfast. He is ordered Boost Breeze TID and has accepted all but 4 cartons of this since order placed on 3/5. He is also ordered Ensure Enlive and  has accepted all but one bottle of this since order was placed on 3/11.  TPN is being stopped today. Antibiotics also being stopped today. Plan for OT and PT to assess for discharge needs.    Medications reviewed; 250 mg florastor BID. Labs reviewed; Ca: 8.8 mg/dl, Alk Phos elevated, LFs elevated.  IVF; NS @ 50 ml/hr.     Diet Order:   Diet Order            Diet regular Room service appropriate? Yes; Fluid consistency: Thin  Diet effective now              EDUCATION NEEDS:   Not appropriate for education at this time  Skin:  Skin Assessment: Reviewed RN Assessment  Last BM:  3/12  Height:   Ht Readings from Last 1 Encounters:  10/25/18 6' 2" (1.88 m)    Weight:   Wt Readings from Last 1 Encounters:  11/23/18 55.1 kg    Ideal Body Weight:  86.4 kg  BMI:  Body mass index is 15.6 kg/m.  Estimated Nutritional Needs:   Kcal:  1900-2100 grams  Protein:  95-115 grams  Fluid:  >/= 1.9 L/day     Jarome Matin, MS, RD, LDN, [facility] Inpatient Clinical Dietitian Pager # [phone] After hours/weekend pager # [phone]

## 2018-11-23 NOTE — Evaluation (Signed)
Physical Therapy Evaluation-1x Patient Details Name: Vincent Park MRN: [id number] DOB: 09-Aug-1956 Today's Date: 11/23/2018   History of Present Illness  63 yo male admitted with perforated bowel pneumoperitoneum. S/P bil flank drains 2/21  Clinical Impression  On eval, pt was Mod Ind-Ind with mobility. He walked ~500 feet around the unit. No PT needs. 1x eval. Will sign off.     Follow Up Recommendations No PT follow up    Equipment Recommendations  None recommended by PT    Recommendations for Other Services       Precautions / Restrictions Restrictions Weight Bearing Restrictions: No      Mobility  Bed Mobility Overal bed mobility: Independent                Transfers Overall transfer level: Independent                  Ambulation/Gait Ambulation/Gait assistance: Modified independent (Device/Increase time) Gait Distance (Feet): 500 Feet Assistive device: IV Pole;None Gait Pattern/deviations: Step-through pattern        Stairs            Wheelchair Mobility    Modified Rankin (Stroke Patients Only)       Balance Overall balance assessment: Mild deficits observed, not formally tested                                           Pertinent Vitals/Pain Pain Assessment: No/denies pain    Home Living Family/patient expects to be discharged to:: Private residence Living Arrangements: Alone Available Help at Discharge: Family;Available PRN/intermittently Type of Home: Apartment Home Access: Level entry     Home Layout: One level Home Equipment: None      Prior Function Level of Independence: Independent               Hand Dominance        Extremity/Trunk Assessment   Upper Extremity Assessment Upper Extremity Assessment: Overall WFL for tasks assessed    Lower Extremity Assessment Lower Extremity Assessment: Overall WFL for tasks assessed    Cervical / Trunk Assessment Cervical / Trunk  Assessment: Normal  Communication   Communication: No difficulties  Cognition Arousal/Alertness: Awake/alert Behavior During Therapy: WFL for tasks assessed/performed Overall Cognitive Status: Within Functional Limits for tasks assessed                                        General Comments      Exercises     Assessment/Plan    PT Assessment Patent does not need any further PT services  PT Problem List         PT Treatment Interventions      PT Goals (Current goals can be found in the Care Plan section)  Acute Rehab PT Goals Patient Stated Goal: back to work PT Goal Formulation: All assessment and education complete, DC therapy    Frequency     Barriers to discharge        Co-evaluation               AM-PAC PT "6 Clicks" Mobility  Outcome Measure Help needed turning from your back to your side while in a flat bed without using bedrails?: None Help needed moving from lying on your back to sitting on the  side of a flat bed without using bedrails?: None Help needed moving to and from a bed to a chair (including a wheelchair)?: None Help needed standing up from a chair using your arms (e.g., wheelchair or bedside chair)?: None Help needed to walk in hospital room?: None Help needed climbing 3-5 steps with a railing? : None 6 Click Score: 24    End of Session   Activity Tolerance: Patient tolerated treatment well Patient left: in bed;with call bell/phone within reach        Time: 1500-1508 PT Time Calculation (min) (ACUTE ONLY): 8 min   Charges:   PT Evaluation $PT Eval Low Complexity: 1 Low            Rebeca Alert, PT Acute Rehabilitation Services Pager: [phone] Office: [phone]

## 2018-11-24 LAB — CBC
HCT: 28.5 % — ABNORMAL LOW (ref 39.0–52.0)
Hemoglobin: 9 g/dL — ABNORMAL LOW (ref 13.0–17.0)
MCH: 33.5 pg (ref 26.0–34.0)
MCHC: 31.6 g/dL (ref 30.0–36.0)
MCV: 105.9 fL — AB (ref 80.0–100.0)
NRBC: 0 % (ref 0.0–0.2)
Platelets: 330 10*3/uL (ref 150–400)
RBC: 2.69 MIL/uL — ABNORMAL LOW (ref 4.22–5.81)
RDW: 16.7 % — ABNORMAL HIGH (ref 11.5–15.5)
WBC: 6.8 10*3/uL (ref 4.0–10.5)

## 2018-11-24 MED ORDER — POLYETHYLENE GLYCOL 3350 17 G PO PACK
17.0000 g | PACK | Freq: Once | ORAL | Status: AC
  Administered 2018-11-24: 17 g via ORAL
  Filled 2018-11-24: qty 1

## 2018-11-24 NOTE — Progress Notes (Signed)
I have reviewed and concur with the student's documentation.  

## 2018-11-24 NOTE — Progress Notes (Signed)
PROGRESS NOTE    Vincent PellantCharles Giese  VWU:[id number] DOB: 1956-01-28 DOA: 10/25/2018 PCP: Patient, No Pcp Per    Brief Narrative:   Vincent Park is a 63 year old  male history significant for perforated peptic ulcer in 2003 status post surgical intervention, GERD presenting with progressive epigastric abdominal pain associated with vomiting.  CT of the abdomen pelvis showed high-grade small bowel obstruction with significant ascites and free intraperitoneal air and peritoneal enhancement.  Patient admitted with possible perforated bowel and pneumoperitoneum.  Further work-up with abdominal MRI showed multiple intra-abdominal abscess with masslike nonspecific wall thickening involving the proximal stomach.  IR consulted and intra-abdominal drains were placed at the site of abscess.    Assessment & Plan:   Principal Problem:   SBO (small bowel obstruction) (HCC) Active Problems:   AKI (acute kidney injury) (HCC)   Cachexia (HCC)   PUD (peptic ulcer disease)   Moderate alcohol consumption   GERD (gastroesophageal reflux disease)   Pneumoperitoneum   Protein-calorie malnutrition, severe (HCC)   Intra-abdominal abscess (HCC)   Pneumoperitoneum with intra-abdominal abscess Patient presenting with progressive abdominal discomfort.  CT abdomen/pelvis on admission notable for high-grade small bowel obstruction with ascites and free intraperitoneal air and peritoneal enhancement.  Upper GI study on 2/19 with no definitive leak but persists with free air.  MRI of the abdomen on 11/02/2018 notable for gastric thickening.  Patient underwent IR drain placements x2 on 11/02/2018 with culture returning back rare ERYSIPELOTHRIX RHUSIOPATHIAE.  Repeat CT scan on 11/08/2018 notable for decreased free air but persistent abscesses with drains in place with 2 new fluid collections.  Patient underwent 2 additional drain placement and upsizing of previous drains on 11/09/2018 by IR.  Repeat CT abdomen/pelvis on  11/16/2018 notable for all 4 drain sites effectively drained except for a deep central pelvic abscess which has decreased.  IR discontinued 3 of the 4 drains.  Underwent EGD on 11/20/2018 with a small hiatal hernia, scar in the incisura that was biopsied; normal duodenal bulb, and enlarged gastric folds. --GI, IR and general surgery continue to follow, appreciate assistance --Repeat CT abdomen/pelvis 11/21/2018 with persistent 4.7 x 4.9 cm thick walled fluid collection low pelvis between bladder/rectum not significantly changed --IR removed final remaining intra-abdominal drain on 11/22/2018 --Zosyn discontinued 3/13 by general surgery --TPN discontinued 3/13 by surgery --Tolerating regular diet --Protonix 40 mg PO daily --Continue to monitor now off antibiotics and drain removed --If no recurrence of fever, white count remains stable and able to tolerate diet plan discharge home early next week.  SBO (small bowel obstruction)  Diet advanced to regular 3/13, discontinuedTPN by general surgery.   --Continue to monitor bowel movements closely  Protein-calorie malnutrition, severe  Cachexia  --TPN discontinued on 11/23/2018  AKI (acute kidney injury)  Secondary to dehydration.  Resolved.  Alcohol abuse No signs of withdrawal.  History of peptic ulcer disease EGD showed 1 healed ulcer.  Transition to once a day PPI oral.  Anemia of chronic disease H&H stable.  Monitor.  Transaminitis Mild.  Monitor while on TPN.   DVT prophylaxis: Heparin Code Status: Full code Family Communication: None Disposition Plan: Continue inpatient level of care   Consultants:   Interventional radiology  General surgery, Dr. Marlyne BeardsJennings  Gastroenterology, Dr. Ewing SchleinMagod  Procedures:   EGD 11/20/2018: No hernia, small healed ulcer at incisor, mucosal nodularity status post biopsy, duodenal bulb/first portion of duodenum/second portion duodenum normal, prominent gastric folds.  IR intra-abdominal drain  placement x4 --> now all d/c'd as of 3/13  PICC line placed 10/26/2018  Antimicrobials:  Zosyn 2/13 - 3/13  Cefazolin 2/27 - 2/28   Subjective: Patient seen and examined at bedside, no complaints this morning.  Tolerating regular diet. Denies headache, no fever/chills/night sweats, no nausea/vomiting/diarrhea, no chest pain, no palpitations, no shortness of breath, no abdominal discomfort, no cough/congestion, no paresthesias.  No acute events overnight per nursing staff.  Objective: Vitals:   11/23/18 1409 11/23/18 2136 11/24/18 0500 11/24/18 0536  BP: (!) 120/58 117/67  (!) 121/46  Pulse: 80 85  83  Resp: 18 15  17   Temp: (!) 97.5 F (36.4 C) 98.5 F (36.9 C)  98.1 F (36.7 C)  TempSrc: Oral Oral  Oral  SpO2:  100%  100%  Weight:   54.8 kg   Height:        Intake/Output Summary (Last 24 hours) at 11/24/2018 1317 Last data filed at 11/24/2018 1000 Gross per 24 hour  Intake 2587.89 ml  Output 3050 ml  Net -462.11 ml   Filed Weights   11/20/18 0451 11/23/18 0514 11/24/18 0500  Weight: 51.7 kg 55.1 kg 54.8 kg    Examination:  General exam: Appears calm and comfortable, thin and cachectic in appearance Respiratory system: Clear to auscultation. Respiratory effort normal. Cardiovascular system: S1 & S2 heard, RRR. No JVD, murmurs, rubs, gallops or clicks. No pedal edema. Gastrointestinal system: Abdomen is nondistended, soft and nontender. No organomegaly or masses felt. Normal bowel sounds heard.   Central nervous system: Alert and oriented. No focal neurological deficits. Extremities: Symmetric 5 x 5 power. Skin: No rashes, lesions or ulcers Psychiatry: Judgement and insight appear normal. Mood & affect appropriate.     Data Reviewed: I have personally reviewed following labs and imaging studies  CBC: Recent Labs  Lab 11/19/18 0357 11/22/18 0425 11/24/18 0314  WBC 6.0 7.3 6.8  NEUTROABS 3.8  --   --   HGB 9.6* 9.2* 9.0*  HCT 30.6* 28.3* 28.5*  MCV 104.8*  104.0* 105.9*  PLT 375 348 330   Basic Metabolic Panel: Recent Labs  Lab 11/19/18 0357 11/20/18 0408 11/22/18 0425  NA 135 134* 135  K 4.1 4.1 4.2  CL 102 102 100  CO2 27 25 25   GLUCOSE 100* 101* 98  BUN 21 21 23   CREATININE 0.70 0.66 0.61  CALCIUM 8.9 8.8* 8.8*  MG 1.9  --  1.8  PHOS 3.9  --  3.9   GFR: Estimated Creatinine Clearance: 72.3 mL/min (by C-G formula based on SCr of 0.61 mg/dL). Liver Function Tests: Recent Labs  Lab 11/19/18 0357 11/20/18 0408 11/22/18 0425  AST 49* 53* 58*  ALT 53* 62* 73*  ALKPHOS 305* 304* 299*  BILITOT 0.4 0.2* 0.3  PROT 7.7 7.5 7.2  ALBUMIN 2.5* 2.6* 2.5*   No results for input(s): LIPASE, AMYLASE in the last 168 hours. No results for input(s): AMMONIA in the last 168 hours. Coagulation Profile: No results for input(s): INR, PROTIME in the last 168 hours. Cardiac Enzymes: No results for input(s): CKTOTAL, CKMB, CKMBINDEX, TROPONINI in the last 168 hours. BNP (last 3 results) No results for input(s): PROBNP in the last 8760 hours. HbA1C: No results for input(s): HGBA1C in the last 72 hours. CBG: Recent Labs  Lab 11/21/18 0613 11/22/18 0451  GLUCAP 99 106*   Lipid Profile: No results for input(s): CHOL, HDL, LDLCALC, TRIG, CHOLHDL, LDLDIRECT in the last 72 hours. Thyroid Function Tests: No results for input(s): TSH, T4TOTAL, FREET4, T3FREE, THYROIDAB in the last 72 hours. Anemia  Panel: No results for input(s): VITAMINB12, FOLATE, FERRITIN, TIBC, IRON, RETICCTPCT in the last 72 hours. Sepsis Labs: No results for input(s): PROCALCITON, LATICACIDVEN in the last 168 hours.  No results found for this or any previous visit (from the past 240 hour(s)).       Radiology Studies: No results found.      Scheduled Meds: . feeding supplement  1 Container Oral TID BM  . feeding supplement (ENSURE ENLIVE)  237 mL Oral TID BM  . heparin injection (subcutaneous)  5,000 Units Subcutaneous Q8H  . multivitamin with minerals   1 tablet Oral Daily  . pantoprazole  40 mg Oral Daily  . saccharomyces boulardii  250 mg Oral BID   Continuous Infusions: . sodium chloride 50 mL/hr at 11/24/18 0724  . sodium chloride Stopped (11/21/18 1807)     LOS: 30 days    Time spent: 26 minutes    Alvira Philips [place], DO [facility] Pager [phone]  If 7PM-7AM, please contact night-coverage [email or web address] Password [email or web address] 11/24/2018, 1:17 PM

## 2018-11-24 NOTE — Plan of Care (Signed)
Patient lying in bed this morning; no complaints of pain noted at this time. Will continue to monitor.

## 2018-11-25 NOTE — Progress Notes (Signed)
PROGRESS NOTE    Vincent Park  KGS:[id number] DOB: [birth year] DOA: 10/25/2018 PCP: Patient, No Pcp Per    Brief Narrative:   Vincent Park is a 63 year old  male history significant for perforated peptic ulcer in 2003 status post surgical intervention, GERD presenting with progressive epigastric and abdominal pain associated with vomiting.  CT of the abdomen pelvis showed high-grade small bowel obstruction with significant ascites and free intraperitoneal air and peritoneal enhancement.  Patient admitted with possible perforated bowel and pneumoperitoneum.  Further work-up with abdominal MRI showed multiple intra-abdominal abscess with masslike nonspecific wall thickening involving the proximal stomach.  IR consulted and intra-abdominal drains were placed at the site of abscess.    Assessment & Plan:   Principal Problem:   SBO (small bowel obstruction) (HCC) Active Problems:   AKI (acute kidney injury) (HCC)   Cachexia (HCC)   PUD (peptic ulcer disease)   Moderate alcohol consumption   GERD (gastroesophageal reflux disease)   Pneumoperitoneum   Protein-calorie malnutrition, severe (HCC)   Intra-abdominal abscess (HCC)   Pneumoperitoneum with intra-abdominal abscess Patient presented with progressive abdominal discomfort. CT abdomen/pelvis on admission notable for high-grade small bowel obstruction with ascites and free intraperitoneal air and peritoneal enhancement.  Upper GI study on 2/19 with no definitive leak but persists with free air.  MRI of the abdomen on 11/02/2018 notable for gastric thickening.  Patient underwent IR drain placements x2 on 11/02/2018 with culture returning back rare ERYSIPELOTHRIX RHUSIOPATHIAE. Repeat CT scan on 11/08/2018 notable for decreased free air but persistent abscesses with drains in place with 2 new fluid collections.  Patient underwent 2 additional drain placement and upsizing of previous drains on 11/09/2018 by IR.  Repeat CT abdomen/pelvis on  11/16/2018 notable for all 4 drain sites effectively drained except for a deep central pelvic abscess which has decreased.  IR discontinued 3 of the 4 drains.  Underwent EGD on 11/20/2018 with a small hiatal hernia, scar in the incisura that was biopsied; normal duodenal bulb, and enlarged gastric folds. --GI, IR and general surgery continue to follow, appreciate assistance --Repeat CT abdomen/pelvis 11/21/2018 with persistent 4.7 x 4.9 cm thick walled fluid collection low pelvis between bladder/rectum not significantly changed --IR removed final remaining intra-abdominal drain on 11/22/2018 --Zosyn discontinued 3/13 by general surgery --TPN discontinued 3/13 by surgery --Tolerating regular diet --Protonix 40 mg PO daily --Continue to monitor now off antibiotics and drain removed --If no recurrence of fever, white count remains stable and able to tolerate diet plan discharge tomorrow. Will check CBC and chemistry tomorrow.   SBO (small bowel obstruction)  Diet advanced to regular 3/13, discontinuedTPN --Continue to monitor bowel movements closely  Protein-calorie malnutrition, severe  Cachexia  --TPN discontinued on 11/23/2018, now tolerating regular diet.  AKI (acute kidney injury)  Secondary to dehydration.  Resolved.  Discontinue IV fluids today.  Alcohol abuse No signs of withdrawal.  History of peptic ulcer disease EGD showed 1 healed ulcer.  Transition to once a day PPI oral.  Anemia of chronic disease H&H stable.  Monitor.  DVT prophylaxis: Heparin Code Status: Full code Family Communication: None Disposition Plan: Continue inpatient level of care   Consultants:   Interventional radiology  General surgery, Dr. Marlyne Beards  Gastroenterology, Dr. Ewing Schlein  Procedures:   EGD 11/20/2018: No hernia, small healed ulcer at incisor, mucosal nodularity status post biopsy, duodenal bulb/first portion of duodenum/second portion duodenum normal, prominent gastric folds.  IR  intra-abdominal drain placement x4 --> now all d/c'd as of 3/13  PICC  line placed 10/26/2018  Antimicrobials:  Zosyn 2/13 - 3/13  Cefazolin 2/27 - 2/28   Subjective: Patient seen and examined at the bedside with no complaints.  He is looking forward to go home tomorrow probably. Objective: Vitals:   11/24/18 1358 11/24/18 2116 11/25/18 0514 11/25/18 1345  BP: 119/68 129/65 (!) 124/56 135/73  Pulse: 86 82 76 85  Resp: 18 15 14 18   Temp: 98 F (36.7 C) 98.3 F (36.8 C) (!) 97.4 F (36.3 C) 98.2 F (36.8 C)  TempSrc: Oral Oral Oral Oral  SpO2: 100% 100% 100% 100%  Weight:   54.3 kg   Height:        Intake/Output Summary (Last 24 hours) at 11/25/2018 1500 Last data filed at 11/25/2018 1356 Gross per 24 hour  Intake 1829.17 ml  Output 2175 ml  Net -345.83 ml   Filed Weights   11/23/18 0514 11/24/18 0500 11/25/18 0514  Weight: 55.1 kg 54.8 kg 54.3 kg    Examination:  General exam: Appears calm and comfortable, thin and cachectic in appearance Respiratory system: Clear to auscultation. Respiratory effort normal. Cardiovascular system: S1 & S2 heard, RRR. No JVD, murmurs, rubs, gallops or clicks. No pedal edema. Gastrointestinal system: Abdomen is nondistended, soft and nontender. No organomegaly or masses felt. Normal bowel sounds heard.  Catheter entry site clean and dry. Central nervous system: Alert and oriented. No focal neurological deficits. Extremities: Symmetric 5 x 5 power. Skin: No rashes, lesions or ulcers Psychiatry: Judgement and insight appear normal. Mood & affect appropriate.     Data Reviewed: I have personally reviewed following labs and imaging studies  CBC: Recent Labs  Lab 11/19/18 0357 11/22/18 0425 11/24/18 0314  WBC 6.0 7.3 6.8  NEUTROABS 3.8  --   --   HGB 9.6* 9.2* 9.0*  HCT 30.6* 28.3* 28.5*  MCV 104.8* 104.0* 105.9*  PLT 375 348 330   Basic Metabolic Panel: Recent Labs  Lab 11/19/18 0357 11/20/18 0408 11/22/18 0425  NA 135  134* 135  K 4.1 4.1 4.2  CL 102 102 100  CO2 27 25 25   GLUCOSE 100* 101* 98  BUN 21 21 23   CREATININE 0.70 0.66 0.61  CALCIUM 8.9 8.8* 8.8*  MG 1.9  --  1.8  PHOS 3.9  --  3.9   GFR: Estimated Creatinine Clearance: 71.6 mL/min (by C-G formula based on SCr of 0.61 mg/dL). Liver Function Tests: Recent Labs  Lab 11/19/18 0357 11/20/18 0408 11/22/18 0425  AST 49* 53* 58*  ALT 53* 62* 73*  ALKPHOS 305* 304* 299*  BILITOT 0.4 0.2* 0.3  PROT 7.7 7.5 7.2  ALBUMIN 2.5* 2.6* 2.5*   No results for input(s): LIPASE, AMYLASE in the last 168 hours. No results for input(s): AMMONIA in the last 168 hours. Coagulation Profile: No results for input(s): INR, PROTIME in the last 168 hours. Cardiac Enzymes: No results for input(s): CKTOTAL, CKMB, CKMBINDEX, TROPONINI in the last 168 hours. BNP (last 3 results) No results for input(s): PROBNP in the last 8760 hours. HbA1C: No results for input(s): HGBA1C in the last 72 hours. CBG: Recent Labs  Lab 11/21/18 0613 11/22/18 0451  GLUCAP 99 106*   Lipid Profile: No results for input(s): CHOL, HDL, LDLCALC, TRIG, CHOLHDL, LDLDIRECT in the last 72 hours. Thyroid Function Tests: No results for input(s): TSH, T4TOTAL, FREET4, T3FREE, THYROIDAB in the last 72 hours. Anemia Panel: No results for input(s): VITAMINB12, FOLATE, FERRITIN, TIBC, IRON, RETICCTPCT in the last 72 hours. Sepsis Labs: No results  for input(s): PROCALCITON, LATICACIDVEN in the last 168 hours.  No results found for this or any previous visit (from the past 240 hour(s)).       Radiology Studies: No results found.    Scheduled Meds: . feeding supplement  1 Container Oral TID BM  . feeding supplement (ENSURE ENLIVE)  237 mL Oral TID BM  . heparin injection (subcutaneous)  5,000 Units Subcutaneous Q8H  . multivitamin with minerals  1 tablet Oral Daily  . pantoprazole  40 mg Oral Daily  . saccharomyces boulardii  250 mg Oral BID   Continuous Infusions: . sodium  chloride Stopped (11/21/18 1807)     LOS: 31 days    Time spent: 15 minutes    Dorna Mai [facility] Pager [phone]  If 7PM-7AM, please contact night-coverage [email or web address] Password [email or web address] 11/25/2018, 3:00 PM

## 2018-11-26 LAB — CBC
HCT: 29.5 % — ABNORMAL LOW (ref 39.0–52.0)
Hemoglobin: 9.6 g/dL — ABNORMAL LOW (ref 13.0–17.0)
MCH: 33.7 pg (ref 26.0–34.0)
MCHC: 32.5 g/dL (ref 30.0–36.0)
MCV: 103.5 fL — ABNORMAL HIGH (ref 80.0–100.0)
Platelets: 329 10*3/uL (ref 150–400)
RBC: 2.85 MIL/uL — ABNORMAL LOW (ref 4.22–5.81)
RDW: 17.1 % — ABNORMAL HIGH (ref 11.5–15.5)
WBC: 7.1 10*3/uL (ref 4.0–10.5)
nRBC: 0 % (ref 0.0–0.2)

## 2018-11-26 LAB — COMPREHENSIVE METABOLIC PANEL
ALBUMIN: 2.7 g/dL — AB (ref 3.5–5.0)
ALT: 94 U/L — ABNORMAL HIGH (ref 0–44)
AST: 52 U/L — AB (ref 15–41)
Alkaline Phosphatase: 241 U/L — ABNORMAL HIGH (ref 38–126)
Anion gap: 8 (ref 5–15)
BUN: 16 mg/dL (ref 8–23)
CO2: 29 mmol/L (ref 22–32)
Calcium: 9 mg/dL (ref 8.9–10.3)
Chloride: 101 mmol/L (ref 98–111)
Creatinine, Ser: 0.71 mg/dL (ref 0.61–1.24)
GFR calc Af Amer: >60 mL/min (ref >60)
GFR calc non Af Amer: >60 mL/min (ref >60)
Glucose, Bld: 90 mg/dL (ref 70–99)
Potassium: 3.9 mmol/L (ref 3.5–5.1)
SODIUM: 138 mmol/L (ref 135–145)
Total Bilirubin: 0.4 mg/dL (ref 0.3–1.2)
Total Protein: 7.1 g/dL (ref 6.5–8.1)

## 2018-11-26 MED ORDER — PANTOPRAZOLE SODIUM 40 MG PO TBEC: 40.0000 mg | DELAYED_RELEASE_TABLET | Freq: Every day | ORAL | 0 refills | Status: AC

## 2018-11-26 MED ORDER — ADULT MULTIVITAMIN W/MINERALS CH: 1.0000 | ORAL_TABLET | Freq: Every day | ORAL | 0 refills | Status: AC

## 2018-11-26 NOTE — Discharge Summary (Signed)
Physician Discharge Summary  Vincent Park UJW:[id number] DOB: 01/08/1956 DOA: 10/25/2018  PCP: Patient, No Pcp Per  Admit date: 10/25/2018 Discharge date: 11/26/2018  Admitted From: home  Disposition:  Home   Recommendations for Outpatient Follow-up:  1. Follow up with PCP in 1-2 weeks 2. Follow up with GI as scheduled.   Home Health:none   Equipment/Devices:none    Discharge Condition:stable   CODE STATUS:full code  Diet recommendation: Regular diet with additional supplemental nutrition's over-the-counter.  Brief/Interim Summary: Vincent Park is a 63 year old  male history significant forperforated peptic ulcer in 2003 status post surgical intervention, GERD presenting with progressive epigastric and abdominal pain associated with vomiting. CT of the abdomen pelvis showed high-grade small bowel obstruction with significant ascites and free intraperitoneal air and peritoneal enhancement.  Patient admitted with possible perforated bowel and pneumoperitoneum. Further work-up with abdominal MRI showed multiple intra-abdominal abscess with masslike nonspecific wall thickening involving the proximal stomach. IR consulted and intra-abdominal drains were placed at the site of abscess. Patient has complicated hospitalizations.  He is going home today after about 34 days in the hospital.  Discharge Diagnoses:  Principal Problem:   SBO (small bowel obstruction) (HCC) Active Problems:   AKI (acute kidney injury) (HCC)   Cachexia (HCC)   PUD (peptic ulcer disease)   Moderate alcohol consumption   GERD (gastroesophageal reflux disease)   Pneumoperitoneum   Protein-calorie malnutrition, severe (HCC)   Intra-abdominal abscess (HCC)  Pneumoperitoneum with intra-abdominal abscess Patient presented with progressive abdominal discomfort. CT abdomen/pelvis on admission notable for high-grade small bowel obstruction with ascites and free intraperitoneal air and peritoneal enhancement. Upper  GI study on 2/19 with no definitive leak but persists with free air. MRI of the abdomen on 11/02/2018 notable for gastric thickening.  Patient underwent IR drain placements x2 on 11/02/2018 with culture returning back rare ERYSIPELOTHRIX RHUSIOPATHIAE. Repeat CT scan on 11/08/2018 notable for decreased free air but persistent abscesses with drains in place with 2 new fluid collections.  Patient underwent 2 additional drain placement and upsizing of previous drains on 11/09/2018 by IR.  Repeat CT abdomen/pelvis on 11/16/2018 notable for all 4 drain sites effectively drained except for a deep central pelvic abscess which has decreased.  IR discontinued 3 of the 4 drains.  Underwent EGD on 11/20/2018 with a small hiatal hernia, scar in the incisura that was biopsied; normal duodenal bulb, and enlarged gastric folds. --GI, IR and general surgery continued to follow, he will be followed by GI as outpatient. --IR removed final remaining intra-abdominal drain on 11/22/2018 --Zosyn discontinued 3/13. --TPN discontinued 3/13. --Tolerating regular diet --Protonix 40 mg PO daily He has tolerated diet.  WBC count has remained stable.  Patient is asymptomatic today.  Case discussed with surgery. Patient is going home today.  Discharge Instructions  Discharge Instructions    Call MD for:  persistant nausea and vomiting   Complete by:  As directed    Call MD for:  severe uncontrolled pain   Complete by:  As directed    Call MD for:  temperature >100.4   Complete by:  As directed    Diet - low sodium heart healthy   Complete by:  As directed    Discharge instructions   Complete by:  As directed    Take over the counter Ensure dietary supplement 3 times a day with meals.   Increase activity slowly   Complete by:  As directed    No wound care   Complete by:  As directed  Allergies as of 11/26/2018   No Known Allergies     Medication List    STOP taking these medications   gi cocktail Susp suspension    omeprazole 20 MG capsule Commonly known as:  PRILOSEC     TAKE these medications   multivitamin with minerals Tabs tablet Take 1 tablet by mouth daily for 30 days. Start taking on:  November 27, 2018   pantoprazole 40 MG tablet Commonly known as:  PROTONIX Take 1 tablet (40 mg total) by mouth daily. Start taking on:  November 27, 2018       No Known Allergies  Consultations:  General surgery  Gastroenterology  Interventional radiology   Procedures/Studies: Dg Abd 1 View  Result Date: 11/08/2018 CLINICAL DATA:  Intra-abdominal free air EXAM: ABDOMEN - 1 VIEW COMPARISON:  November 03, 2018 FINDINGS: The bowel gas pattern is normal. Nasogastric tube is identified distal tip probably in the proximal stomach. Free air in the left upper abdomen is unchanged. Changed to in bilateral abdomen are unchanged. IMPRESSION: Persistent free air is not significantly changed. No bowel obstruction. Electronically Signed   By: Sherian Rein M.D.   On: 11/08/2018 07:34   Dg Abd 1 View  Result Date: 11/03/2018 CLINICAL DATA:  Encounter for nasogastric tube placement. EXAM: ABDOMEN - 1 VIEW COMPARISON:  10/26/2018 FINDINGS: Nasogastric tube in the upper abdomen. Catheter tip is likely in the proximal stomach. The tube side hole is at the GE junction. Bilateral abdominal pigtail drains. Pockets of gas in the left upper abdomen appear to represent persistent free air. Elevation of the right hemidiaphragm with probable right basilar atelectasis. IMPRESSION: Tip of the nasogastric tube is in the proximal stomach region. The side-hole is near the GE junction. Bilateral abdominal pigtail drains. Persistent free air in the left upper quadrant of the abdomen. Electronically Signed   By: Richarda Overlie M.D.   On: 11/03/2018 11:20   Mr Abdomen W Wo Contrast  Result Date: 11/02/2018 CLINICAL DATA:  Evaluate for malignancy. EXAM: MRI ABDOMEN WITHOUT AND WITH CONTRAST TECHNIQUE: Multiplanar multisequence MR imaging of  the abdomen was performed both before and after the administration of intravenous contrast. CONTRAST:  5 cc of Gadavist. COMPARISON:  10/25/2018 FINDINGS: Lower chest: Small right pleural effusion is new from previous exam. Hepatobiliary: Mild hepatic steatosis. No focal enhancing liver lesions identified. The gallbladder appears normal. The common bile duct measures 6 mm in maximum dimension. Pancreas: Normal appearance of the pancreas. No inflammation mass or main duct dilatation. Spleen:  Within normal limits in size and appearance. Adrenals/Urinary Tract: Normal appearance of the adrenal glands. Small 8 mm cyst noted within the left kidney. No enhancing mass or hydronephrosis. Stomach/Bowel: Masslike thickening of the proximal wall of stomach is identified. This measures approximately 8.5 cm in length and has a thickness of 3.1 cm. This is nonspecific and although this may be due to gastritis underlying mass is not excluded. Persistent abnormal dilatation of small-bowel loops is again identified measuring up to 3.8 cm in diameter. Gaseous distension of the colon is noted as well. Vascular/Lymphatic: Normal appearance of the abdominal aorta. No aneurysm. No definitive adenopathy noted at this time. Other: There are multiple large peripherally enhancing fluid collections identified within both sides of the abdomen. Within the left abdomen fluid collection measures 15.1 by 8.3 by 21.1 cm. In the right hemiabdomen there is a large fluid collection measuring 8.3 x 14.8 by 21.0 cm. These appear more well defined with progressive mural enhancement compatible with  mature fluid collections. There is progressive mass effect along the dome of liver, image 19/903. Continued foci of pneumoperitoneum also noted, for example overlying anterior left lobe of liver, image 32/901. Musculoskeletal: No suspicious bone lesions identified. IMPRESSION: 1. Large bilateral abdominal fluid collections are identified on today's exam. These  appear increased in size with peripheral enhancement compatible with maturing fluid collections. There is also continued pneumoperitoneum. Findings are compatible with perforated viscus. Suspect multiple intra-abdominal abscesses. 2. There is nonspecific, masslike wall thickening involving the proximal stomach. Findings may be due to gastritis. Underlying mass would be difficult to exclude and correlation with endoscopic inspection should be considered. 3. Persistent abnormal distension of small bowel loops. There is also gaseous distension of the colon. In the setting of perforated viscus with multiple intra-abdominal fluid collections assist a nonspecific finding and may reflect inflammatory ileus. Bowel obstruction can up be excluded. 4. New small right pleural effusion. Electronically Signed   By: Signa Kell M.D.   On: 11/02/2018 15:03   Ct Abdomen Pelvis W Contrast  Result Date: 11/21/2018 CLINICAL DATA:  Abdominal infection, free air, bowel thickening, follow-up multiple prior exams EXAM: CT ABDOMEN AND PELVIS WITH CONTRAST TECHNIQUE: Multidetector CT imaging of the abdomen and pelvis was performed using the standard protocol following bolus administration of intravenous contrast. CONTRAST:  OMNIPAQUE IOHEXOL 300 MG/ML  SOLN COMPARISON:  11/16/2018, 11/08/2018, 10/25/2018 FINDINGS: Lower chest: Right basilar atelectasis or consolidation, unchanged prior examination. Hepatobiliary: No focal liver abnormality is seen. No gallstones, gallbladder wall thickening, or biliary dilatation. Pancreas: Unremarkable. No pancreatic ductal dilatation or surrounding inflammatory changes. Spleen: Normal in size without focal abnormality. Adrenals/Urinary Tract: Adrenal glands are unremarkable. Kidneys are normal, without renal calculi, focal lesion, or hydronephrosis. Bladder is unremarkable. Stomach/Bowel: Stomach is within normal limits. Appendix appears normal. No evidence of bowel wall thickening,  distention, or inflammatory changes. Large burden of stool in the colon. Vascular/Lymphatic: No significant vascular findings are present. No enlarged abdominal or pelvic lymph nodes. Reproductive: No mass or other abnormality. Other: No abdominal wall hernia or abnormality. There is one remaining percutaneous pigtail drain catheter, positioned with formed pigtail in the right lower quadrant. There appears to be a persistent thick-walled fluid collection in the low pelvis interposed between the bladder and rectum, not significantly changed in size, measuring approximately 4.7 by 4.9 cm (series 2, image 73). No other significant volume of abdominal fluid. Musculoskeletal: No acute or significant osseous findings. IMPRESSION: There is one remaining percutaneous pigtail drain catheter, positioned with formed pigtail in the right lower quadrant. There appears to be a persistent thick-walled fluid collection in the low pelvis interposed between the bladder and rectum, not significantly changed in size, measuring approximately 4.7 by 4.9 cm (series 2, image 73). No other significant volume of abdominal fluid. Electronically Signed   By: Lauralyn Primes M.D.   On: 11/21/2018 14:30   Ct Abdomen Pelvis W Contrast  Result Date: 11/16/2018 CLINICAL DATA:  Intra-abdominal infection and status post staged placement 4 separate percutaneous peritoneal drainage catheters. These drains all now combined have minimal output. EXAM: CT ABDOMEN AND PELVIS WITH CONTRAST TECHNIQUE: Multidetector CT imaging of the abdomen and pelvis was performed using the standard protocol following bolus administration of intravenous contrast. CONTRAST:  OMNIPAQUE IOHEXOL 300 MG/ML  SOLN COMPARISON:  11/08/2018 FINDINGS: Lower chest: No acute abnormality. Hepatobiliary: No focal liver abnormality is seen. No gallstones, gallbladder wall thickening, or biliary dilatation. Pancreas: Unremarkable. No pancreatic ductal dilatation or surrounding  inflammatory changes. Spleen:  Normal in size without focal abnormality. Adrenals/Urinary Tract: Adrenal glands are unremarkable. Kidneys are normal, without renal calculi, focal lesion, or hydronephrosis. Bladder is unremarkable. Stomach/Bowel: Nasogastric tube has been removed. There remains suggestion of some proximal gastric wall thickening without evidence of overt visible ulceration. Some free intraperitoneal air remains in the left upper quadrant/subphrenic region no evidence of bowel obstruction or significant ileus. Vascular/Lymphatic: No significant vascular findings are present. No enlarged abdominal or pelvic lymph nodes. Reproductive: Prostate is unremarkable. Other: Drain 1 posterior to the liver with no residual fluid surrounding the drain. Drain 2 in left lateral pericolic region with no fluid surrounding the drain. Drain 3 in anterior pelvic right lower quadrant with no fluid surrounding the drainage catheter. Drain 4 in left lower quadrant pelvis just superior to the bladder with no residual fluid surrounding the drain. The only undrained fluid collection is in the deep central pelvis measuring approximately 4.2 x 4.6 cm and demonstrating some decrease in size since the prior CT. Musculoskeletal: No acute or significant osseous findings. IMPRESSION: Significant improvement since the prior CT. All 4 drains have now effectively completely drained their respective abscess collections with no further fluid remaining around any of the drains. The only undrained fluid is a deep central pelvic abscess which has decreased in size since the prior CT. Electronically Signed   By: Irish Lack M.D.   On: 11/16/2018 09:33   Ct Abdomen Pelvis W Contrast  Result Date: 11/08/2018 CLINICAL DATA:  Status post percutaneous catheter drainage large bilateral flank retroperitoneal fluid collections on 11/02/2018. EXAM: CT ABDOMEN AND PELVIS WITH CONTRAST TECHNIQUE: Multidetector CT imaging of the abdomen and  pelvis was performed using the standard protocol following bolus administration of intravenous contrast. CONTRAST:  OMNIPAQUE IOHEXOL 300 MG/ML  SOLN COMPARISON:  CT of the abdomen and pelvis on 10/25/2018 and MRI of the abdomen on 11/02/2018 FINDINGS: Lower chest: Mild atelectasis at the right lung base. Hepatobiliary: No focal liver abnormality is seen. No gallstones, gallbladder wall thickening, or biliary dilatation. Pancreas: Unremarkable. No pancreatic ductal dilatation or surrounding inflammatory changes. Spleen: Normal in size without focal abnormality. Adrenals/Urinary Tract: Adrenal glands are unremarkable. Kidneys are normal, without renal calculi, focal lesion, or hydronephrosis. Bladder is unremarkable. Stomach/Bowel: The stomach is decompressed by a nasogastric catheter. There remains wall thickening involving the proximal stomach and body of the stomach. There remains some free intraperitoneal air in the left upper abdomen and left subphrenic space with overall decrease in free air since the prior CT on 10/25/2018. No small bowel obstruction or significant colonic ileus. Vascular/Lymphatic: No significant vascular findings are present. No enlarged abdominal or pelvic lymph nodes. Reproductive: Prostate is unremarkable. Other: Drainage catheter on the right extends up to the posterior margin of the lower liver. There is a small amount residual fluid inferior to the liver. Drainage catheter on the left extends into a residual large left lateral abdominal abscess measuring up to 5.8 x 10.3 cm transversely. This collection also extends into the left pelvis. Additional right lower quadrant anterior peritoneal abscess just deep to the abdominal wall measures approximately 8 cm in transverse with extension into the right pelvis. Additional deep pelvic collection anterior to the rectum measures approximately 4.2 x 5.8 cm. There are some other small areas of fluid interspersed between small bowel loops  in the central abdomen and throughout the pelvis. Musculoskeletal: No acute or significant osseous findings. IMPRESSION: 1. Decrease in free intraperitoneal air in the left upper abdomen and left subphrenic space. 2.  Affective drainage of right lateral abdominal fluid collection by catheter placement likely in the retroperitoneal space. 3. The left lateral abdominal fluid collection is still quite large after catheter placement and it is recommended that the drain be flushed appropriately. If this does not result in further fluid return, this catheter may need to be exchanged and upsized as well as slightly repositioned in the cavity. 4. Two additional undrained marginated fluid collections in the right lower quadrant and deep central pelvis. The right lower quadrant collection that extends into the right pelvis is just deep to the abdominal wall and would be amenable to percutaneous catheter drainage, if indicated. The deep pelvic collection is not currently approachable percutaneously. Electronically Signed   By: Irish Lack M.D.   On: 11/08/2018 10:50   Ir Catheter Tube Change  Result Date: 11/09/2018 INDICATION: 63 year old male with florid infectious peritonitis and extensive intraperitoneal abscesses. He was previously treated by placement of bilateral percutaneous drainage catheters on 11/02/2018. Recurrent CT imaging demonstrates ineffective drainage. He presents today for drain exchange, up size and placement of additional drainage catheters. EXAM: 1. Drain injection, exchange and up size of the existing right upper quadrant drainage catheter. 2. Placement of a new right lower quadrant drainage catheter using ultrasound and fluoroscopic guidance. 3. Drain injection, exchange and up size of the existing left upper quadrant drainage catheter. 4. Placement of a new left lower quadrant drainage catheter using ultrasound and fluoroscopic guidance. MEDICATIONS: 2 g Ancef ANESTHESIA/SEDATION: Fentanyl 100  mcg IV; Versed 3 mg IV Moderate Sedation Time:  40 minutes The patient was continuously monitored during the procedure by the interventional radiology nurse under my direct supervision. FLUOROSCOPY TIME:  3 minutes 24 seconds, 10 mGy COMPLICATIONS: None immediate. PROCEDURE: Informed written consent was obtained from the patient after a thorough discussion of the procedural risks, benefits and alternatives. All questions were addressed. Maximal Sterile Barrier Technique was utilized including caps, mask, sterile gowns, sterile gloves, sterile drape, hand hygiene and skin antiseptic. A timeout was performed prior to the initiation of the procedure. Attention was first turned to the existing right upper quadrant drainage catheter which enters the peritoneal cavity from the right mid abdomen. A gentle hand injection of contrast material was performed. There is a persistent abscess cavity around the tip of the catheter. The injected contrast material extends inferiorly along the right abdominal wall nearly to the skin entry site. The decision was made to proceed with drain exchange and up size. The drainage type also be transition to a biliary drain to facilitate drainage of the entire length of the collection. The retention suture was cut and released. The catheter was transected and removed over an Amplatz wire. The skin tract was dilated to 79 [place] and a 57 [place] biliary drainage catheter was advanced over the wire and formed. Injection through the biliary drain demonstrates its location within the right abdominal fluid collection. The drainage catheter was secured to the skin with 0 Prolene suture and connected to JP bulb suction. Attention was next turned to the right lower quadrant where there was a known fluid collection on the prior CT scan. Ultrasound reveals a complex fluid pocket consistent with peritoneal fluid. A skin entry site was marked. Local anesthesia was attained by infiltration with 1% lidocaine.  A small dermatotomy was made. Under real-time sonographic guidance, an 18 gauge single wall needle was advanced into the fluid collection. A 0.018 wire was then advanced through the needle and the needle was removed. The skin tract  was dilated to 22 [place] and a Cook 14 [place] percutaneous drain was advanced into the fluid collection and formed. A hand injection of contrast material reveals that the fluid collection extends inferiorly into the right inguinal recess. Therefore, the catheter was manipulated into the more dependent aspect in the right inguinal recess. The catheter was secured to the skin with 0 Prolene suture and connected to JP bulb suction. Attention was next turned to the existing left upper quadrant drain. Again, a hand injection of contrast material was performed. This demonstrates a large persistent abscess cavity. The internal fluid appears quite thick. The decision was made to proceed with up sizing of this drainage catheter to a larger drain to facilitate drainage. The retention suture was cut. The catheter was transected and removed over an Amplatz wire. The skin tract was dilated to 22 [place] and a 16 [place] Thal drainage catheter was advanced over the wire and formed in the left upper quadrant. The catheter was connected to JP bulb suction and secured to the skin with 0 Prolene suture. This fluid collection is known to extend significantly along the left abdominal wall and into the pelvis. Therefore, ultrasound was again used to evaluate the fluid collection. There is a large fluid collection in the left abdomen. A suitable skin entry site was selected and marked. Local anesthesia was attained by infiltration with 1% lidocaine. A small dermatotomy was made. Under real-time sonographic guidance, an 18 gauge single wall needle was used to puncture the fluid collection. An Amplatz wire was advanced in an inferior direction toward the left inguinal recess. The 18 gauge needle was removed. The  skin tract was dilated to 38 [place] and a 37 [place] biliary drain was advanced over the wire and positioned in the anatomic pelvis. Contrast injection confirms that the catheter is located within the peritoneal fluid. The catheter was flushed and connected to JP bulb suction and secured to the skin with 0 Prolene suture. IMPRESSION: 1. Exchange and up size of existing right upper quadrant drainage catheter to a 14 [place] biliary drain. 2. Placement of a new right lower quadrant 14 French all-purpose drainage catheter. 3. Exchange and up size of existing left upper quadrant drainage catheter to a 16 [place] Thal drain. 4. Placement of a new left lower quadrant 14 French biliary drainage catheter. PLAN: 1. Maintain all tubes to JP bulb suction. 2. All tubes should be flushed at least once per shift. 3. Once drain output has diminished or ceased, additional CT imaging of the abdomen and pelvis with intravenous contrast should be obtained prior to drain removal. Signed, Sterling Big, MD, RPVI [facility] Radiology Electronically Signed   By: Malachy Moan M.D.   On: 11/09/2018 10:43   Ir Catheter Tube Change  Result Date: 11/09/2018 INDICATION: 63 year old male with florid infectious peritonitis and extensive intraperitoneal abscesses. He was previously treated by placement of bilateral percutaneous drainage catheters on 11/02/2018. Recurrent CT imaging demonstrates ineffective drainage. He presents today for drain exchange, up size and placement of additional drainage catheters. EXAM: 1. Drain injection, exchange and up size of the existing right upper quadrant drainage catheter. 2. Placement of a new right lower quadrant drainage catheter using ultrasound and fluoroscopic guidance. 3. Drain injection, exchange and up size of the existing left upper quadrant drainage catheter. 4. Placement of a new left lower quadrant drainage catheter using ultrasound and  fluoroscopic guidance. MEDICATIONS: 2 g Ancef ANESTHESIA/SEDATION: Fentanyl 100 mcg IV; Versed 3 mg  IV Moderate Sedation Time:  40 minutes The patient was continuously monitored during the procedure by the interventional radiology nurse under my direct supervision. FLUOROSCOPY TIME:  3 minutes 24 seconds, 10 mGy COMPLICATIONS: None immediate. PROCEDURE: Informed written consent was obtained from the patient after a thorough discussion of the procedural risks, benefits and alternatives. All questions were addressed. Maximal Sterile Barrier Technique was utilized including caps, mask, sterile gowns, sterile gloves, sterile drape, hand hygiene and skin antiseptic. A timeout was performed prior to the initiation of the procedure. Attention was first turned to the existing right upper quadrant drainage catheter which enters the peritoneal cavity from the right mid abdomen. A gentle hand injection of contrast material was performed. There is a persistent abscess cavity around the tip of the catheter. The injected contrast material extends inferiorly along the right abdominal wall nearly to the skin entry site. The decision was made to proceed with drain exchange and up size. The drainage type also be transition to a biliary drain to facilitate drainage of the entire length of the collection. The retention suture was cut and released. The catheter was transected and removed over an Amplatz wire. The skin tract was dilated to 71 [place] and a 14 [place] biliary drainage catheter was advanced over the wire and formed. Injection through the biliary drain demonstrates its location within the right abdominal fluid collection. The drainage catheter was secured to the skin with 0 Prolene suture and connected to JP bulb suction. Attention was next turned to the right lower quadrant where there was a known fluid collection on the prior CT scan. Ultrasound reveals a complex fluid pocket consistent with peritoneal fluid. A skin entry  site was marked. Local anesthesia was attained by infiltration with 1% lidocaine. A small dermatotomy was made. Under real-time sonographic guidance, an 18 gauge single wall needle was advanced into the fluid collection. A 0.018 wire was then advanced through the needle and the needle was removed. The skin tract was dilated to 64 [place] and a Cook 14 [place] percutaneous drain was advanced into the fluid collection and formed. A hand injection of contrast material reveals that the fluid collection extends inferiorly into the right inguinal recess. Therefore, the catheter was manipulated into the more dependent aspect in the right inguinal recess. The catheter was secured to the skin with 0 Prolene suture and connected to JP bulb suction. Attention was next turned to the existing left upper quadrant drain. Again, a hand injection of contrast material was performed. This demonstrates a large persistent abscess cavity. The internal fluid appears quite thick. The decision was made to proceed with up sizing of this drainage catheter to a larger drain to facilitate drainage. The retention suture was cut. The catheter was transected and removed over an Amplatz wire. The skin tract was dilated to 83 [place] and a 16 [place] Thal drainage catheter was advanced over the wire and formed in the left upper quadrant. The catheter was connected to JP bulb suction and secured to the skin with 0 Prolene suture. This fluid collection is known to extend significantly along the left abdominal wall and into the pelvis. Therefore, ultrasound was again used to evaluate the fluid collection. There is a large fluid collection in the left abdomen. A suitable skin entry site was selected and marked. Local anesthesia was attained by infiltration with 1% lidocaine. A small dermatotomy was made. Under real-time sonographic guidance, an 18 gauge single wall needle was used to puncture the fluid collection. An Amplatz  wire was advanced in an inferior  direction toward the left inguinal recess. The 18 gauge needle was removed. The skin tract was dilated to 51 [place] and a 2 [place] biliary drain was advanced over the wire and positioned in the anatomic pelvis. Contrast injection confirms that the catheter is located within the peritoneal fluid. The catheter was flushed and connected to JP bulb suction and secured to the skin with 0 Prolene suture. IMPRESSION: 1. Exchange and up size of existing right upper quadrant drainage catheter to a 14 [place] biliary drain. 2. Placement of a new right lower quadrant 14 French all-purpose drainage catheter. 3. Exchange and up size of existing left upper quadrant drainage catheter to a 16 [place] Thal drain. 4. Placement of a new left lower quadrant 14 French biliary drainage catheter. PLAN: 1. Maintain all tubes to JP bulb suction. 2. All tubes should be flushed at least once per shift. 3. Once drain output has diminished or ceased, additional CT imaging of the abdomen and pelvis with intravenous contrast should be obtained prior to drain removal. Signed, Sterling Big, MD, RPVI [facility] Radiology Electronically Signed   By: Malachy Moan M.D.   On: 11/09/2018 10:43   Ir US Guide Bx Asp/drain  Result Date: 11/09/2018 INDICATION: 63 year old male with florid infectious peritonitis and extensive intraperitoneal abscesses. He was previously treated by placement of bilateral percutaneous drainage catheters on 11/02/2018. Recurrent CT imaging demonstrates ineffective drainage. He presents today for drain exchange, up size and placement of additional drainage catheters. EXAM: 1. Drain injection, exchange and up size of the existing right upper quadrant drainage catheter. 2. Placement of a new right lower quadrant drainage catheter using ultrasound and fluoroscopic guidance. 3. Drain injection, exchange and up size of the existing left upper quadrant drainage catheter.  4. Placement of a new left lower quadrant drainage catheter using ultrasound and fluoroscopic guidance. MEDICATIONS: 2 g Ancef ANESTHESIA/SEDATION: Fentanyl 100 mcg IV; Versed 3 mg IV Moderate Sedation Time:  40 minutes The patient was continuously monitored during the procedure by the interventional radiology nurse under my direct supervision. FLUOROSCOPY TIME:  3 minutes 24 seconds, 10 mGy COMPLICATIONS: None immediate. PROCEDURE: Informed written consent was obtained from the patient after a thorough discussion of the procedural risks, benefits and alternatives. All questions were addressed. Maximal Sterile Barrier Technique was utilized including caps, mask, sterile gowns, sterile gloves, sterile drape, hand hygiene and skin antiseptic. A timeout was performed prior to the initiation of the procedure. Attention was first turned to the existing right upper quadrant drainage catheter which enters the peritoneal cavity from the right mid abdomen. A gentle hand injection of contrast material was performed. There is a persistent abscess cavity around the tip of the catheter. The injected contrast material extends inferiorly along the right abdominal wall nearly to the skin entry site. The decision was made to proceed with drain exchange and up size. The drainage type also be transition to a biliary drain to facilitate drainage of the entire length of the collection. The retention suture was cut and released. The catheter was transected and removed over an Amplatz wire. The skin tract was dilated to 20 [place] and a 53 [place] biliary drainage catheter was advanced over the wire and formed. Injection through the biliary drain demonstrates its location within the right abdominal fluid collection. The drainage catheter was secured to the skin with 0 Prolene suture and connected to JP bulb suction. Attention was next turned to the  right lower quadrant where there was a known fluid collection on the prior CT scan.  Ultrasound reveals a complex fluid pocket consistent with peritoneal fluid. A skin entry site was marked. Local anesthesia was attained by infiltration with 1% lidocaine. A small dermatotomy was made. Under real-time sonographic guidance, an 18 gauge single wall needle was advanced into the fluid collection. A 0.018 wire was then advanced through the needle and the needle was removed. The skin tract was dilated to 12 [place] and a Cook 14 [place] percutaneous drain was advanced into the fluid collection and formed. A hand injection of contrast material reveals that the fluid collection extends inferiorly into the right inguinal recess. Therefore, the catheter was manipulated into the more dependent aspect in the right inguinal recess. The catheter was secured to the skin with 0 Prolene suture and connected to JP bulb suction. Attention was next turned to the existing left upper quadrant drain. Again, a hand injection of contrast material was performed. This demonstrates a large persistent abscess cavity. The internal fluid appears quite thick. The decision was made to proceed with up sizing of this drainage catheter to a larger drain to facilitate drainage. The retention suture was cut. The catheter was transected and removed over an Amplatz wire. The skin tract was dilated to 12 [place] and a 16 [place] Thal drainage catheter was advanced over the wire and formed in the left upper quadrant. The catheter was connected to JP bulb suction and secured to the skin with 0 Prolene suture. This fluid collection is known to extend significantly along the left abdominal wall and into the pelvis. Therefore, ultrasound was again used to evaluate the fluid collection. There is a large fluid collection in the left abdomen. A suitable skin entry site was selected and marked. Local anesthesia was attained by infiltration with 1% lidocaine. A small dermatotomy was made. Under real-time sonographic guidance, an 18 gauge single wall  needle was used to puncture the fluid collection. An Amplatz wire was advanced in an inferior direction toward the left inguinal recess. The 18 gauge needle was removed. The skin tract was dilated to 78 [place] and a 55 [place] biliary drain was advanced over the wire and positioned in the anatomic pelvis. Contrast injection confirms that the catheter is located within the peritoneal fluid. The catheter was flushed and connected to JP bulb suction and secured to the skin with 0 Prolene suture. IMPRESSION: 1. Exchange and up size of existing right upper quadrant drainage catheter to a 14 [place] biliary drain. 2. Placement of a new right lower quadrant 14 French all-purpose drainage catheter. 3. Exchange and up size of existing left upper quadrant drainage catheter to a 16 [place] Thal drain. 4. Placement of a new left lower quadrant 14 French biliary drainage catheter. PLAN: 1. Maintain all tubes to JP bulb suction. 2. All tubes should be flushed at least once per shift. 3. Once drain output has diminished or ceased, additional CT imaging of the abdomen and pelvis with intravenous contrast should be obtained prior to drain removal. Signed, Sterling Big, MD, RPVI [facility] Radiology Electronically Signed   By: Malachy Moan M.D.   On: 11/09/2018 10:43   Ir US Guide Bx Asp/drain  Result Date: 11/09/2018 INDICATION: 63 year old male with florid infectious peritonitis and extensive intraperitoneal abscesses. He was previously treated by placement of bilateral percutaneous drainage catheters on 11/02/2018. Recurrent CT imaging demonstrates ineffective drainage. He presents today for drain exchange, up size  and placement of additional drainage catheters. EXAM: 1. Drain injection, exchange and up size of the existing right upper quadrant drainage catheter. 2. Placement of a new right lower quadrant drainage catheter using ultrasound and fluoroscopic guidance. 3.  Drain injection, exchange and up size of the existing left upper quadrant drainage catheter. 4. Placement of a new left lower quadrant drainage catheter using ultrasound and fluoroscopic guidance. MEDICATIONS: 2 g Ancef ANESTHESIA/SEDATION: Fentanyl 100 mcg IV; Versed 3 mg IV Moderate Sedation Time:  40 minutes The patient was continuously monitored during the procedure by the interventional radiology nurse under my direct supervision. FLUOROSCOPY TIME:  3 minutes 24 seconds, 10 mGy COMPLICATIONS: None immediate. PROCEDURE: Informed written consent was obtained from the patient after a thorough discussion of the procedural risks, benefits and alternatives. All questions were addressed. Maximal Sterile Barrier Technique was utilized including caps, mask, sterile gowns, sterile gloves, sterile drape, hand hygiene and skin antiseptic. A timeout was performed prior to the initiation of the procedure. Attention was first turned to the existing right upper quadrant drainage catheter which enters the peritoneal cavity from the right mid abdomen. A gentle hand injection of contrast material was performed. There is a persistent abscess cavity around the tip of the catheter. The injected contrast material extends inferiorly along the right abdominal wall nearly to the skin entry site. The decision was made to proceed with drain exchange and up size. The drainage type also be transition to a biliary drain to facilitate drainage of the entire length of the collection. The retention suture was cut and released. The catheter was transected and removed over an Amplatz wire. The skin tract was dilated to 2114 [place] and a 7814 [place] biliary drainage catheter was advanced over the wire and formed. Injection through the biliary drain demonstrates its location within the right abdominal fluid collection. The drainage catheter was secured to the skin with 0 Prolene suture and connected to JP bulb suction. Attention was next turned to the  right lower quadrant where there was a known fluid collection on the prior CT scan. Ultrasound reveals a complex fluid pocket consistent with peritoneal fluid. A skin entry site was marked. Local anesthesia was attained by infiltration with 1% lidocaine. A small dermatotomy was made. Under real-time sonographic guidance, an 18 gauge single wall needle was advanced into the fluid collection. A 0.018 wire was then advanced through the needle and the needle was removed. The skin tract was dilated to 4414 [place] and a Cook 14 [place] percutaneous drain was advanced into the fluid collection and formed. A hand injection of contrast material reveals that the fluid collection extends inferiorly into the right inguinal recess. Therefore, the catheter was manipulated into the more dependent aspect in the right inguinal recess. The catheter was secured to the skin with 0 Prolene suture and connected to JP bulb suction. Attention was next turned to the existing left upper quadrant drain. Again, a hand injection of contrast material was performed. This demonstrates a large persistent abscess cavity. The internal fluid appears quite thick. The decision was made to proceed with up sizing of this drainage catheter to a larger drain to facilitate drainage. The retention suture was cut. The catheter was transected and removed over an Amplatz wire. The skin tract was dilated to 3116 [place] and a 16 [place] Thal drainage catheter was advanced over the wire and formed in the left upper quadrant. The catheter was connected to JP bulb suction and secured to the skin with 0 Prolene  suture. This fluid collection is known to extend significantly along the left abdominal wall and into the pelvis. Therefore, ultrasound was again used to evaluate the fluid collection. There is a large fluid collection in the left abdomen. A suitable skin entry site was selected and marked. Local anesthesia was attained by infiltration with 1% lidocaine. A small  dermatotomy was made. Under real-time sonographic guidance, an 18 gauge single wall needle was used to puncture the fluid collection. An Amplatz wire was advanced in an inferior direction toward the left inguinal recess. The 18 gauge needle was removed. The skin tract was dilated to 33 [place] and a 92 [place] biliary drain was advanced over the wire and positioned in the anatomic pelvis. Contrast injection confirms that the catheter is located within the peritoneal fluid. The catheter was flushed and connected to JP bulb suction and secured to the skin with 0 Prolene suture. IMPRESSION: 1. Exchange and up size of existing right upper quadrant drainage catheter to a 14 [place] biliary drain. 2. Placement of a new right lower quadrant 14 French all-purpose drainage catheter. 3. Exchange and up size of existing left upper quadrant drainage catheter to a 16 [place] Thal drain. 4. Placement of a new left lower quadrant 14 French biliary drainage catheter. PLAN: 1. Maintain all tubes to JP bulb suction. 2. All tubes should be flushed at least once per shift. 3. Once drain output has diminished or ceased, additional CT imaging of the abdomen and pelvis with intravenous contrast should be obtained prior to drain removal. Signed, Sterling Big, MD, RPVI [facility] Radiology Electronically Signed   By: Malachy Moan M.D.   On: 11/09/2018 10:43   Ir US Guide Bx Asp/drain  Result Date: 11/02/2018 INDICATION: Bilateral intra-abdominal peritoneal fluid collections EXAM: Ultrasound bilateral abdominal flank abscess drains MEDICATIONS: The patient is currently admitted to the hospital and receiving intravenous antibiotics. The antibiotics were administered within an appropriate time frame prior to the initiation of the procedure. ANESTHESIA/SEDATION: Fentanyl 100 mcg IV; Versed 2.0 mg IV Moderate Sedation Time:  13 minutes The patient was continuously monitored during the  procedure by the interventional radiology nurse under my direct supervision. COMPLICATIONS: None. PROCEDURE: Informed written consent was obtained from the patient after a thorough discussion of the procedural risks, benefits and alternatives. All questions were addressed. Maximal Sterile Barrier Technique was utilized including caps, mask, sterile gowns, sterile gloves, sterile drape, hand hygiene and skin antiseptic. A timeout was performed prior to the initiation of the procedure. Previous imaging reviewed. Ultrasound performed. The complex bilateral flank abdominal fluid collection or localized and marked. Under sterile conditions and local anesthesia, ultrasound needle access performed of the bilateral flank abdominal fluid collections with 18 gauge 15 cm needles. Needle positions confirmed with ultrasound. Images obtained for documentation. Amplatz guidewires inserted followed by bilateral 10 French drains. Drain catheter positions confirmed with ultrasound. Syringe aspiration yielded purulent fluid bilaterally compatible with peritonitis and intra-abdominal pus. Catheter secured with Prolene sutures and connected to external gravity drainage bags. Approximately 1 L of purulent fluid drained from each catheter. IMPRESSION: Successful ultrasound bilateral abdominal flank abscess drain placements. Purulent fluid removed. Sample sent for culture and cytology. Electronically Signed   By: Judie Petit.  Shick M.D.   On: 11/02/2018 16:52   Ir US Guide Bx Asp/drain  Result Date: 11/02/2018 INDICATION: Bilateral intra-abdominal peritoneal fluid collections EXAM: Ultrasound bilateral abdominal flank abscess drains MEDICATIONS: The patient is currently admitted to the hospital and receiving intravenous antibiotics.  The antibiotics were administered within an appropriate time frame prior to the initiation of the procedure. ANESTHESIA/SEDATION: Fentanyl 100 mcg IV; Versed 2.0 mg IV Moderate Sedation Time:  13 minutes The patient  was continuously monitored during the procedure by the interventional radiology nurse under my direct supervision. COMPLICATIONS: None. PROCEDURE: Informed written consent was obtained from the patient after a thorough discussion of the procedural risks, benefits and alternatives. All questions were addressed. Maximal Sterile Barrier Technique was utilized including caps, mask, sterile gowns, sterile gloves, sterile drape, hand hygiene and skin antiseptic. A timeout was performed prior to the initiation of the procedure. Previous imaging reviewed. Ultrasound performed. The complex bilateral flank abdominal fluid collection or localized and marked. Under sterile conditions and local anesthesia, ultrasound needle access performed of the bilateral flank abdominal fluid collections with 18 gauge 15 cm needles. Needle positions confirmed with ultrasound. Images obtained for documentation. Amplatz guidewires inserted followed by bilateral 10 French drains. Drain catheter positions confirmed with ultrasound. Syringe aspiration yielded purulent fluid bilaterally compatible with peritonitis and intra-abdominal pus. Catheter secured with Prolene sutures and connected to external gravity drainage bags. Approximately 1 L of purulent fluid drained from each catheter. IMPRESSION: Successful ultrasound bilateral abdominal flank abscess drain placements. Purulent fluid removed. Sample sent for culture and cytology. Electronically Signed   By: Judie Petit.  Shick M.D.   On: 11/02/2018 16:52   Dg Kayleen Memos W Single Cm (sol Or Thin Ba)  Result Date: 10/31/2018 CLINICAL DATA:  63 year old male inpatient with remote history of repair of perforated gastric ulcer, presents with pneumoperitoneum and clinical concern for perforated gastric ulcer or gastric outlet obstruction. EXAM: WATER SOLUBLE UPPER GI SERIES TECHNIQUE: Single-column upper GI series was performed using water soluble contrast. CONTRAST:  Water-soluble oral contrast. COMPARISON:   10/26/2018 abdominal radiographs and 10/25/2018 CT abdomen/pelvis. FLUOROSCOPY TIME:  Fluoroscopy Time:  2 minutes 6 seconds Radiation Exposure Index (if provided by the fluoroscopic device): 33.9 mGy Number of Acquired Spot Images: 6 FINDINGS: Enteric tube terminates in proximal stomach with side port at the esophagogastric junction. Free intraperitoneal air is noted in the left upper quadrant on the scout radiograph. Mildly dilated small bowel loops are noted in the central abdomen, similar to decreased from prior abdominal radiograph. Grossly normal esophagus on limited views. Oral contrast transits the stomach into the duodenum and proximal small bowel without delay. An ulcer is identified in the anterior proximal body of the stomach. No contrast leak from the stomach into the peritoneal cavity was elicited. There is irregular wall thickening in the anterior proximal gastric wall surrounding the ulcer site. There is narrowing of the body of the stomach. Incidentally noted small bowel malrotation, with the duodenal jejunal junction to the right of the spine. Proximal small bowel loops are mildly dilated. IMPRESSION: 1. Gastric ulcer identified in the anterior proximal body of the stomach, with surrounding irregular gastric wall thickening, cannot exclude a malignant ulcer. Persistent free intraperitoneal air in the left upper quadrant surrounding the ulcer site. No water-soluble contrast leak from the stomach into the peritoneal cavity detected. 2. No evidence of gastric outlet obstruction. Nonspecific narrowing of the body of the stomach. 3. Incidental small bowel malrotation. Proximal small bowel dilatation is unchanged from recent CT and abdominal radiograph, probably due to ileus due to peritonitis. These results were called by telephone at the time of interpretation on 10/31/2018 at 11:41 am to Dr. Abigail Miyamoto , who verbally acknowledged these results. Electronically Signed   By: Jannifer Rodney.D.  On:  10/31/2018 12:00      Subjective:   Discharge Exam: Vitals:   11/25/18 2105 11/26/18 0613  BP: 116/64 121/64  Pulse: 79 79  Resp: 16 20  Temp: 98.4 F (36.9 C) 98.3 F (36.8 C)  SpO2: 100% 100%   Vitals:   11/25/18 0514 11/25/18 1345 11/25/18 2105 11/26/18 0613  BP: (!) 124/56 135/73 116/64 121/64  Pulse: 76 85 79 79  Resp: 14 18 16 20   Temp: (!) 97.4 F (36.3 C) 98.2 F (36.8 C) 98.4 F (36.9 C) 98.3 F (36.8 C)  TempSrc: Oral Oral Oral Oral  SpO2: 100% 100% 100% 100%  Weight: 54.3 kg     Height:        General: Pt is alert, awake, not in acute distress Walking in the hallway. Cardiovascular: RRR, S1/S2 +, no rubs, no gallops Respiratory: CTA bilaterally, no wheezing, no rhonchi Abdominal: Soft, NT, ND, bowel sounds +, catheter site clean and dry. Extremities: no edema, no cyanosis    The results of significant diagnostics from this hospitalization (including imaging, microbiology, ancillary and laboratory) are listed below for reference.     Microbiology: No results found for this or any previous visit (from the past 240 hour(s)).   Labs: BNP (last 3 results) No results for input(s): BNP in the last 8760 hours. Basic Metabolic Panel: Recent Labs  Lab 11/20/18 0408 11/22/18 0425 11/26/18 0405  NA 134* 135 138  K 4.1 4.2 3.9  CL 102 100 101  CO2 25 25 29   GLUCOSE 101* 98 90  BUN 21 23 16   CREATININE 0.66 0.61 0.71  CALCIUM 8.8* 8.8* 9.0  MG  --  1.8  --   PHOS  --  3.9  --    Liver Function Tests: Recent Labs  Lab 11/20/18 0408 11/22/18 0425 11/26/18 0405  AST 53* 58* 52*  ALT 62* 73* 94*  ALKPHOS 304* 299* 241*  BILITOT 0.2* 0.3 0.4  PROT 7.5 7.2 7.1  ALBUMIN 2.6* 2.5* 2.7*   No results for input(s): LIPASE, AMYLASE in the last 168 hours. No results for input(s): AMMONIA in the last 168 hours. CBC: Recent Labs  Lab 11/22/18 0425 11/24/18 0314 11/26/18 0405  WBC 7.3 6.8 7.1  HGB 9.2* 9.0* 9.6*  HCT 28.3* 28.5* 29.5*  MCV  104.0* 105.9* 103.5*  PLT 348 330 329   Cardiac Enzymes: No results for input(s): CKTOTAL, CKMB, CKMBINDEX, TROPONINI in the last 168 hours. BNP: Invalid input(s): POCBNP CBG: Recent Labs  Lab 11/21/18 0613 11/22/18 0451  GLUCAP 99 106*   D-Dimer No results for input(s): DDIMER in the last 72 hours. Hgb A1c No results for input(s): HGBA1C in the last 72 hours. Lipid Profile No results for input(s): CHOL, HDL, LDLCALC, TRIG, CHOLHDL, LDLDIRECT in the last 72 hours. Thyroid function studies No results for input(s): TSH, T4TOTAL, T3FREE, THYROIDAB in the last 72 hours.  Invalid input(s): FREET3 Anemia work up No results for input(s): VITAMINB12, FOLATE, FERRITIN, TIBC, IRON, RETICCTPCT in the last 72 hours. Urinalysis    Component Value Date/Time   COLORURINE AMBER (A) 10/25/2018 1816   APPEARANCEUR CLEAR 10/25/2018 1816   LABSPEC 1.020 10/25/2018 1816   PHURINE 5.5 10/25/2018 1816   GLUCOSEU NEGATIVE 10/25/2018 1816   HGBUR NEGATIVE 10/25/2018 1816   BILIRUBINUR NEGATIVE 10/25/2018 1816   KETONESUR NEGATIVE 10/25/2018 1816   PROTEINUR NEGATIVE 10/25/2018 1816   NITRITE NEGATIVE 10/25/2018 1816   LEUKOCYTESUR NEGATIVE 10/25/2018 1816   Sepsis Labs Invalid input(s): PROCALCITONIN,  WBC,  LACTICIDVEN Microbiology No results found for this or any previous visit (from the past 240 hour(s)).   Time coordinating discharge:  35 minutes  SIGNED:   Dorcas Carrow, MD  [facility] 11/26/2018, 12:33 PM Pager [phone]  If 7PM-7AM, please contact night-coverage [email or web address] Password [email or web address]

## 2018-11-26 NOTE — Plan of Care (Signed)
  Problem: Education: Goal: Knowledge of General Education information will improve Description Including pain rating scale, medication(s)/side effects and non-pharmacologic comfort measures Outcome: Progressing   Problem: Health Behavior/Discharge Planning: Goal: Ability to manage health-related needs will improve Outcome: Progressing   Problem: Nutrition: Goal: Adequate nutrition will be maintained Outcome: Progressing   Problem: Coping: Goal: Level of anxiety will decrease Outcome: Progressing   Problem: Pain Managment: Goal: General experience of comfort will improve Outcome: Progressing   Problem: Skin Integrity: Goal: Risk for impaired skin integrity will decrease Outcome: Progressing  Patient progressing. Will continue to educate.

## 2020-01-31 IMAGING — RF DG UGI W/O KUB
11 of 15 series · 13 of 24 positions shown · IV contrast (agent unspecified)
Comparison: 10/26/2018 abdominal radiographs and 10/25/2018 CT
abdomen/pelvis.

CLINICAL DATA: 63-year-old male inpatient with remote history of
repair of perforated gastric ulcer, presents with pneumoperitoneum
and clinical concern for perforated gastric ulcer or gastric outlet
obstruction.

EXAM:
WATER SOLUBLE UPPER GI SERIES
TECHNIQUE: Single-column upper GI series was performed using water soluble
contrast.
CONTRAST:  Water-soluble oral contrast.

[Series 1: t abdomen supine · 0.15mm/px · 1 of 1 slices shown]
[im 1/1]
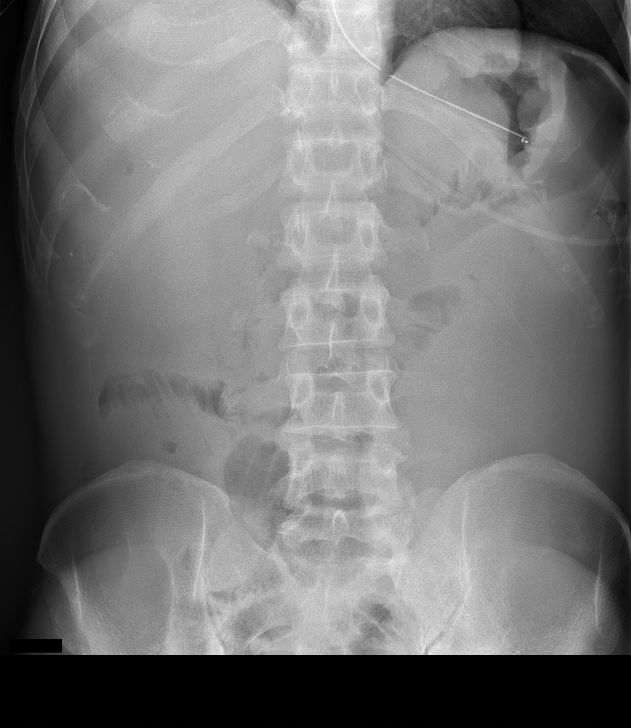

[Series 2: cp_standard · 0.59mm/px · 1 of 148 frames shown (1 of 7)]
[frame 23/148]
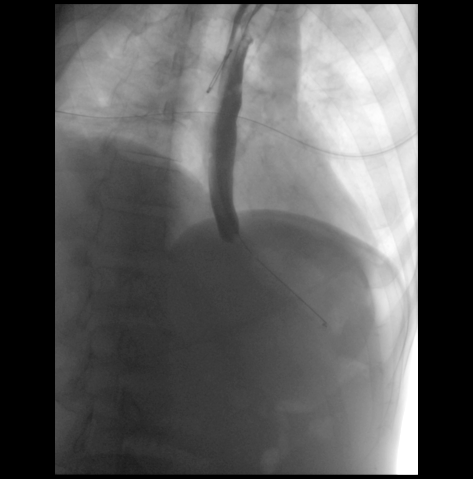

[Series 3: cp_standard · 0.30mm/px · 1 of 1 slices shown (2 of 7)]
[im 1/1]
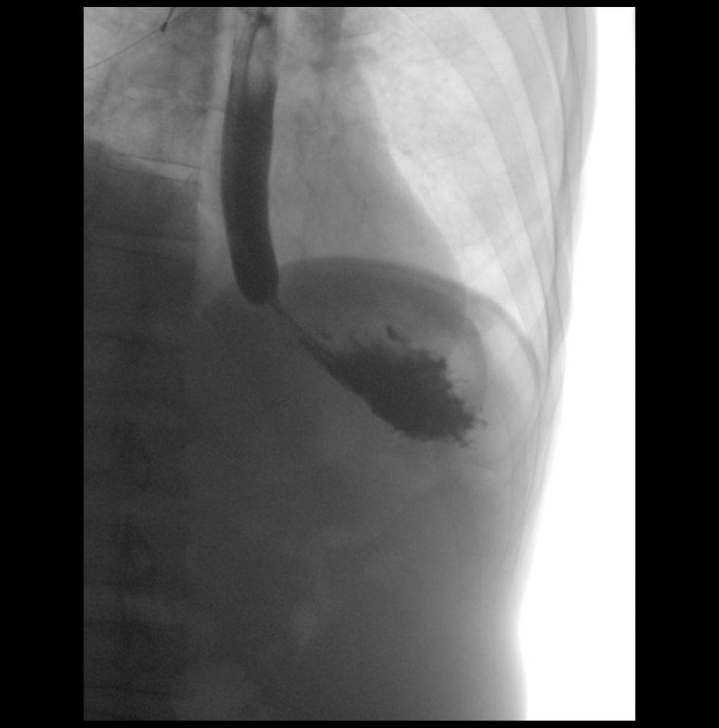

[Series 5: cp_standard · 0.30mm/px · 1 of 1 slices shown (3 of 7)]
[im 1/1]
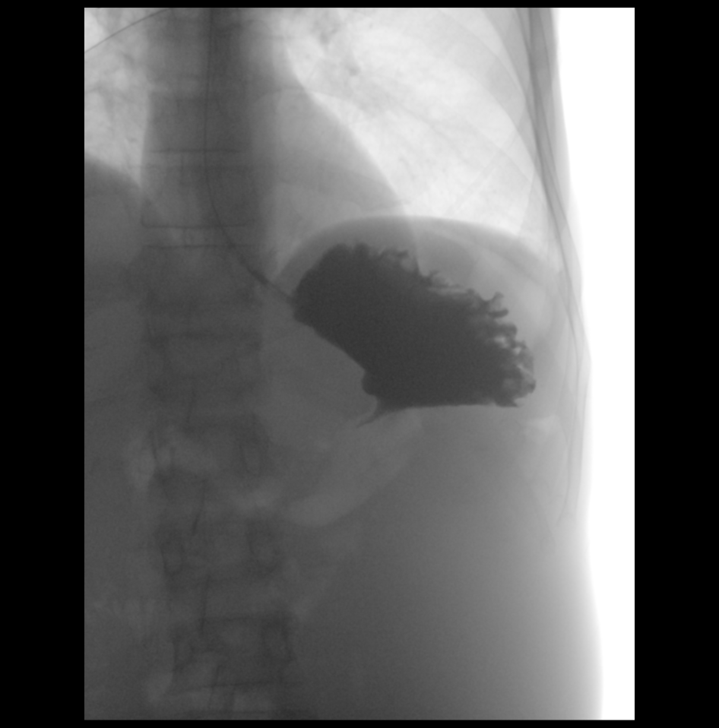

[Series 6: cp_standard · 0.60mm/px · 1 of 90 frames shown (4 of 7)]
[frame 69/90]
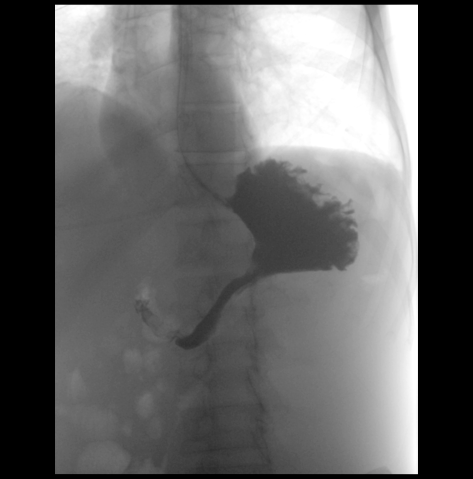

[Series 7: cp_standard · 0.60mm/px · 2 of 107 frames shown (5 of 7)]
[frame 17/107]
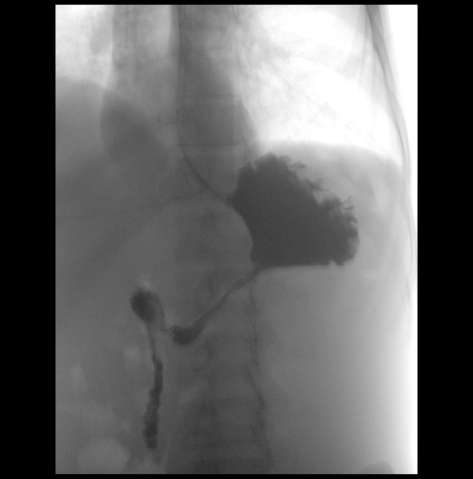
[frame 91/107]
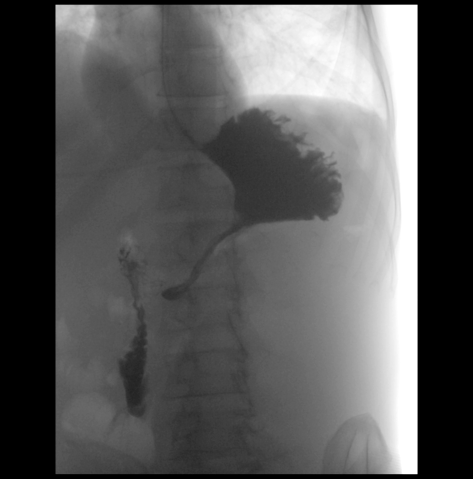

[Series 8: fluoro_barium 2fps_bw · 0.20mm/px · 1 of 2 frames shown (1 of 3)]
[frame 1/2]
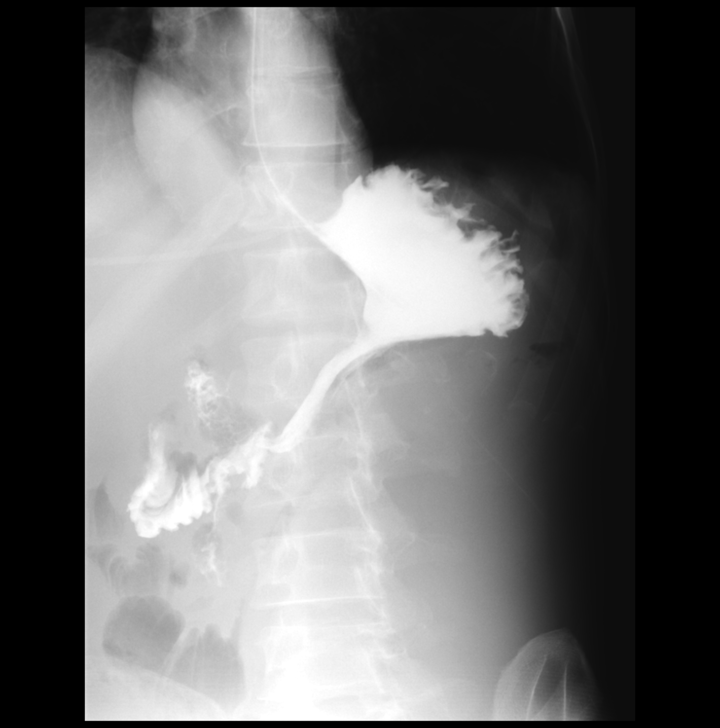

[Series 9: cp_standard · 0.60mm/px · 2 of 132 frames shown (6 of 7)]
[frame 20/132]
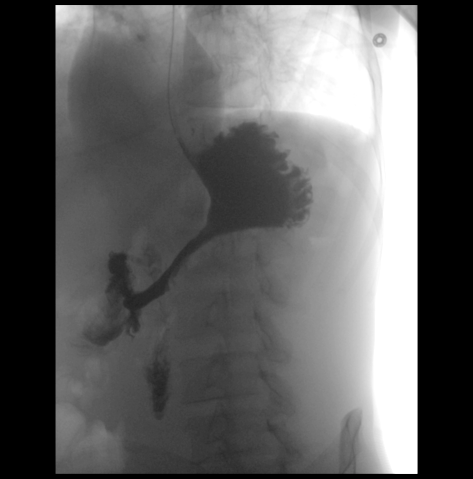
[frame 113/132]
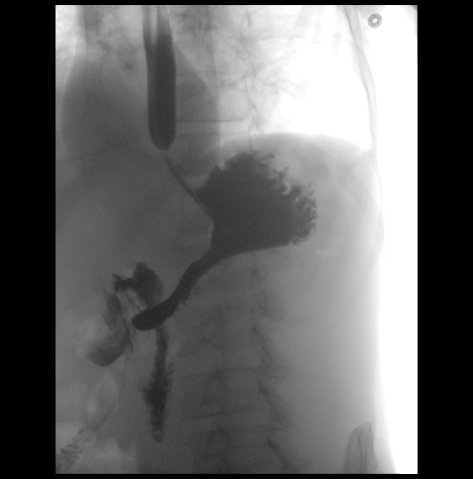

[Series 11: cp_standard · 0.30mm/px · 1 of 1 slices shown (7 of 7)]
[im 1/1]
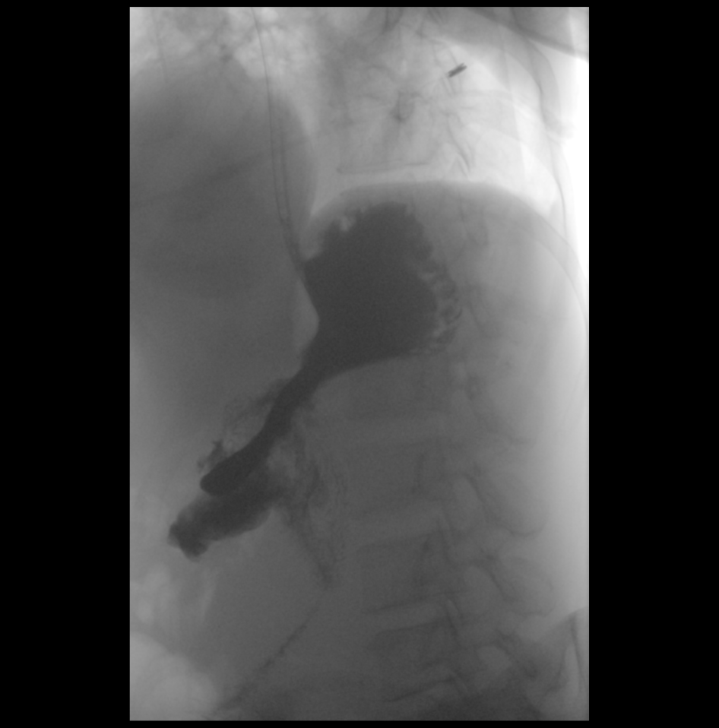

[Series 14: fluoro_barium 2fps_bw · 0.20mm/px · 1 of 1 slices shown (2 of 3)]
[im 1/1]
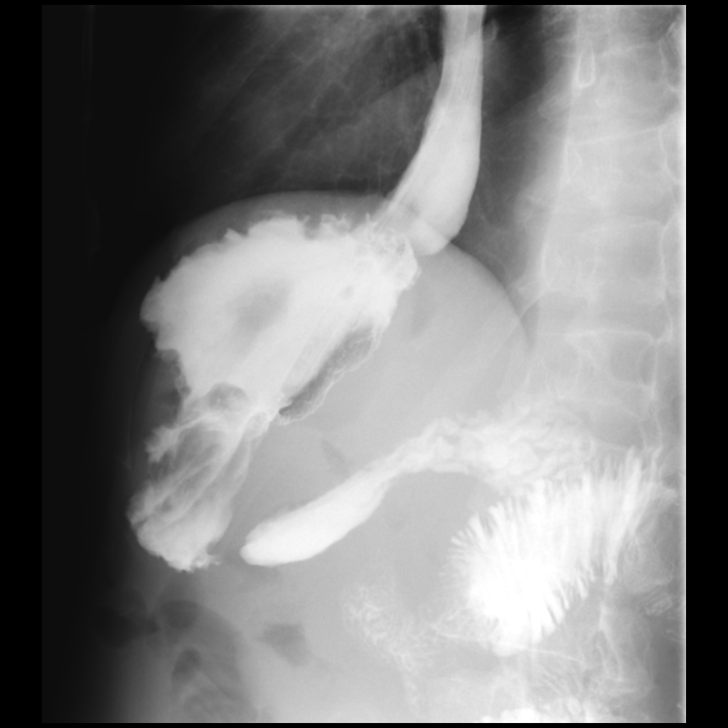

[Series 16: fluoro_barium 2fps_bw · 0.21mm/px · 1 of 1 slices shown (3 of 3)]
[im 1/1]
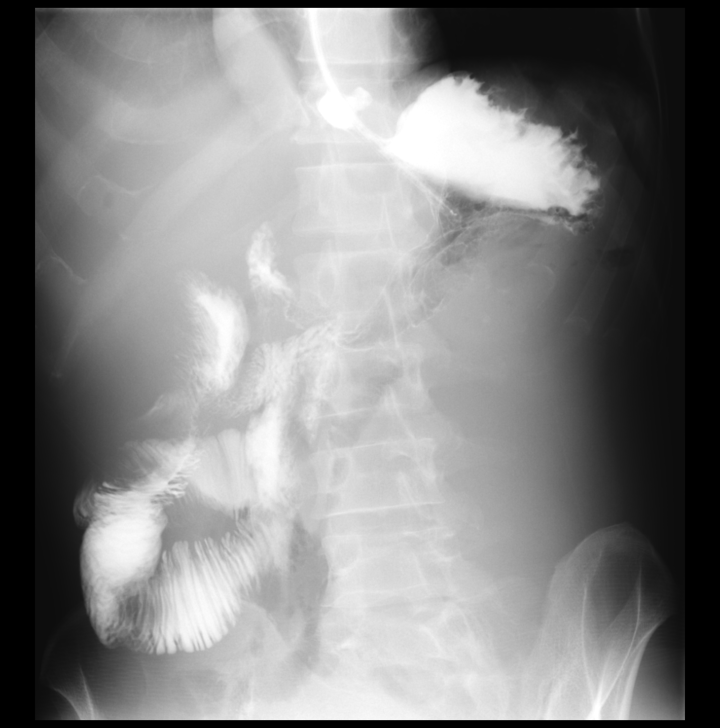

[13 of 24 positions shown; findings below may reference images not displayed]

FLUOROSCOPY TIME:  Fluoroscopy Time:  2 minutes 6 seconds

Radiation Exposure Index (if provided by the fluoroscopic device):
33.9 mGy

Number of Acquired Spot Images: 6
FINDINGS: Enteric tube terminates in proximal stomach with side port at the
esophagogastric junction. Free intraperitoneal air is noted in the
left upper quadrant on the scout radiograph. Mildly dilated small
bowel loops are noted in the central abdomen, similar to decreased
from prior abdominal radiograph.

Grossly normal esophagus on limited views. Oral contrast transits
the stomach into the duodenum and proximal small bowel without
delay. An ulcer is identified in the anterior proximal body of the
stomach. No contrast leak from the stomach into the peritoneal
cavity was elicited. There is irregular wall thickening in the
anterior proximal gastric wall surrounding the ulcer site. There is
narrowing of the body of the stomach.

Incidentally noted small bowel malrotation, with the duodenal
jejunal junction to the right of the spine. Proximal small bowel
loops are mildly dilated.
IMPRESSION: 1. Gastric ulcer identified in the anterior proximal body of the
stomach, with surrounding irregular gastric wall thickening, cannot
exclude a malignant ulcer. Persistent free intraperitoneal air in
the left upper quadrant surrounding the ulcer site. No water-soluble
contrast leak from the stomach into the peritoneal cavity detected.
2. No evidence of gastric outlet obstruction. Nonspecific narrowing
of the body of the stomach.
3. Incidental small bowel malrotation. Proximal small bowel
dilatation is unchanged from recent CT and abdominal radiograph,
probably due to ileus due to peritonitis.

These results were called by telephone at the time of interpretation
on 10/31/2018 at [DATE] to Dr. ASHIMA NOVO , who verbally
acknowledged these results.
